# Patient Record
Sex: Female | Born: 1935
Health system: Southern US, Community
[De-identification: ages and names within clinical notes are randomized; demographics above are authoritative.]

## PROBLEM LIST (undated history)

## (undated) DIAGNOSIS — H409 Unspecified glaucoma: Secondary | ICD-10-CM

## (undated) DIAGNOSIS — K635 Polyp of colon: Secondary | ICD-10-CM

## (undated) DIAGNOSIS — E785 Hyperlipidemia, unspecified: Secondary | ICD-10-CM

## (undated) DIAGNOSIS — R32 Unspecified urinary incontinence: Secondary | ICD-10-CM

## (undated) DIAGNOSIS — F32A Depression, unspecified: Secondary | ICD-10-CM

## (undated) DIAGNOSIS — Z8619 Personal history of other infectious and parasitic diseases: Secondary | ICD-10-CM

## (undated) DIAGNOSIS — F329 Major depressive disorder, single episode, unspecified: Secondary | ICD-10-CM

## (undated) DIAGNOSIS — K219 Gastro-esophageal reflux disease without esophagitis: Secondary | ICD-10-CM

## (undated) DIAGNOSIS — G4730 Sleep apnea, unspecified: Secondary | ICD-10-CM

## (undated) DIAGNOSIS — T7840XA Allergy, unspecified, initial encounter: Secondary | ICD-10-CM

## (undated) DIAGNOSIS — I10 Essential (primary) hypertension: Secondary | ICD-10-CM

## (undated) HISTORY — DX: Polyp of colon: K63.5

## (undated) HISTORY — DX: Personal history of other infectious and parasitic diseases: Z86.19

## (undated) HISTORY — DX: Sleep apnea, unspecified: G47.30

## (undated) HISTORY — DX: Major depressive disorder, single episode, unspecified: F32.9

## (undated) HISTORY — DX: Unspecified urinary incontinence: R32

## (undated) HISTORY — DX: Unspecified glaucoma: H40.9

## (undated) HISTORY — PX: EYE SURGERY: SHX253

## (undated) HISTORY — DX: Gastro-esophageal reflux disease without esophagitis: K21.9

## (undated) HISTORY — DX: Depression, unspecified: F32.A

## (undated) HISTORY — PX: JOINT REPLACEMENT: SHX530

## (undated) HISTORY — DX: Hyperlipidemia, unspecified: E78.5

## (undated) HISTORY — PX: BREAST EXCISIONAL BIOPSY: SUR124

## (undated) HISTORY — DX: Allergy, unspecified, initial encounter: T78.40XA

## (undated) HISTORY — DX: Essential (primary) hypertension: I10

---

## 1963-01-17 HISTORY — PX: VARICOSE VEIN SURGERY: SHX832

## 1972-01-17 HISTORY — PX: CHOLECYSTECTOMY: SHX55

## 1982-01-16 HISTORY — PX: ABDOMINAL HYSTERECTOMY: SHX81

## 1988-01-17 HISTORY — PX: CARPAL TUNNEL RELEASE: SHX101

## 2002-01-16 HISTORY — PX: CATARACT EXTRACTION, BILATERAL: SHX1313

## 2004-01-17 HISTORY — PX: REPLACEMENT TOTAL KNEE BILATERAL: SUR1225

## 2005-01-16 HISTORY — PX: REPLACEMENT TOTAL KNEE: SUR1224

## 2017-08-06 LAB — HM MAMMOGRAPHY

## 2017-08-13 LAB — HM DIABETES EYE EXAM

## 2018-01-02 DIAGNOSIS — Z8639 Personal history of other endocrine, nutritional and metabolic disease: Secondary | ICD-10-CM | POA: Insufficient documentation

## 2018-01-02 DIAGNOSIS — M1990 Unspecified osteoarthritis, unspecified site: Secondary | ICD-10-CM | POA: Insufficient documentation

## 2018-01-02 DIAGNOSIS — G4733 Obstructive sleep apnea (adult) (pediatric): Secondary | ICD-10-CM | POA: Insufficient documentation

## 2018-01-02 DIAGNOSIS — I10 Essential (primary) hypertension: Secondary | ICD-10-CM | POA: Insufficient documentation

## 2018-02-06 ENCOUNTER — Ambulatory Visit (INDEPENDENT_AMBULATORY_CARE_PROVIDER_SITE_OTHER): Payer: Medicare Other | Admitting: Nurse Practitioner

## 2018-02-06 ENCOUNTER — Encounter: Payer: Self-pay | Admitting: Nurse Practitioner

## 2018-02-06 ENCOUNTER — Other Ambulatory Visit: Payer: Self-pay

## 2018-02-06 VITALS — BP 151/76 | HR 69 | Temp 97.3°F | Ht 65.0 in | Wt 330.5 lb

## 2018-02-06 DIAGNOSIS — M216X1 Other acquired deformities of right foot: Secondary | ICD-10-CM

## 2018-02-06 DIAGNOSIS — Z6841 Body Mass Index (BMI) 40.0 and over, adult: Secondary | ICD-10-CM

## 2018-02-06 DIAGNOSIS — R29898 Other symptoms and signs involving the musculoskeletal system: Secondary | ICD-10-CM

## 2018-02-06 DIAGNOSIS — R2689 Other abnormalities of gait and mobility: Secondary | ICD-10-CM

## 2018-02-06 DIAGNOSIS — K219 Gastro-esophageal reflux disease without esophagitis: Secondary | ICD-10-CM | POA: Diagnosis not present

## 2018-02-06 DIAGNOSIS — M1990 Unspecified osteoarthritis, unspecified site: Secondary | ICD-10-CM

## 2018-02-06 DIAGNOSIS — F324 Major depressive disorder, single episode, in partial remission: Secondary | ICD-10-CM | POA: Diagnosis not present

## 2018-02-06 DIAGNOSIS — I10 Essential (primary) hypertension: Secondary | ICD-10-CM

## 2018-02-06 DIAGNOSIS — Z7689 Persons encountering health services in other specified circumstances: Secondary | ICD-10-CM

## 2018-02-06 DIAGNOSIS — M216X2 Other acquired deformities of left foot: Secondary | ICD-10-CM

## 2018-02-06 MED ORDER — NAPROXEN SODIUM 550 MG PO TABS: 550.0000 mg | ORAL_TABLET | Freq: Two times a day (BID) | ORAL | 1 refills | Status: DC

## 2018-02-06 MED ORDER — TRIAMTERENE-HCTZ 75-50 MG PO TABS: 1.0000 | ORAL_TABLET | Freq: Every day | ORAL | 1 refills | Status: DC

## 2018-02-06 MED ORDER — DULOXETINE HCL 60 MG PO CPEP: 60.0000 mg | ORAL_CAPSULE | Freq: Every day | ORAL | 1 refills | Status: DC

## 2018-02-06 MED ORDER — PANTOPRAZOLE SODIUM 40 MG PO TBEC: 40.0000 mg | DELAYED_RELEASE_TABLET | Freq: Every day | ORAL | 3 refills | Status: DC

## 2018-02-06 MED ORDER — LOSARTAN POTASSIUM 100 MG PO TABS: 100.0000 mg | ORAL_TABLET | Freq: Every day | ORAL | 3 refills | Status: DC

## 2018-02-06 NOTE — Progress Notes (Signed)
Subjective:    Patient ID: Lindsey Jacobs, female    DOB: Jul 05, 1935, 83 y.o.   MRN: [id number]  Lindsey Jacobs is a 83 y.o. female presenting on 02/06/2018 for Establish Care   HPI Establish Care New Provider Pt last seen by PCP Dr. Yetta BarreJones in [place], [place] about 6 months ago.  Obtain records.  - Dr. Chilton SiGreen [facility]opedic: Ankle pain bilateral - Dr. Modena NunneryApple ophthalmology: [facility] - Dr. Linde GillisFlannagan Dentist  Patient is not accompanied into the exam room by family today, but her daughter brought her to the appointment.  Patient is now living with her daughter as primary caregiver.  Memory impairment Patient is concerned about her own safety with memory impairment.  She realized she would not be able to live alone much longer.  She notes she was leaving pot on on the stove, forgetting to lock her door.  Is not driving as she is physically unable to get to her car by herself.  Osteoarthritis - [facility].  Has not been since last June.  Has joined Y here, but hasn't gotten there yet to exercise.  Doesn't want to ask her daughter to take her during the week.   - Has considered Monday morning as a good time.  Her daughter works M-F 3rd shift.   - Patient has taken regular naproxen 500 mg bid for arthritis pain.  Hypertension - Patient notes she has been stable on triamterene-hydrochlorothiazide 75-50 mg once daily, losartan 100 mg once daily.  Depression, pain: patient continues on duloxetine 60mg  once daily.  Tolerates well without side effects.    GERD: patient takes pantoprazole with good control of heartburn and reflux symptoms.  She notes no bleeding symptoms.  She reports no n/v, coffee ground emesis, dark/black/tarry stool, BRBPR, or other GI bleeding.  Pronation of feet Patient reports she has RIGHT shoe support/orthotic after-market addition start to come off her shoe.  She previously had these shoes purchased with after-market support added to  prevent overpronation of foot/ankle.  She continues to have ankle weakness and pain with desire and need for ongoing orthotics for ambulation.  Social History   Tobacco Use  . Smoking status: Never Smoker  . Smokeless tobacco: Never Used  Substance Use Topics  . Alcohol use: Never    Frequency: Never  . Drug use: Never    Review of Systems  Constitutional: Negative for activity change, appetite change and fatigue.  Respiratory: Negative for cough and shortness of breath.   Cardiovascular: Negative for chest pain, palpitations and leg swelling.  Gastrointestinal: Negative for constipation, diarrhea, nausea and vomiting.  Genitourinary: Negative for dysuria, frequency and urgency.  Musculoskeletal: Positive for arthralgias, back pain and gait problem. Negative for myalgias.  Skin: Negative for rash.  Neurological: Positive for weakness. Negative for dizziness and headaches.  Psychiatric/Behavioral: Positive for confusion (memory impairment/forgetfulness). Negative for dysphoric mood. The patient is not nervous/anxious.    Per HPI unless specifically indicated above     Objective:    BP (!) 151/76 (BP Location: Left Arm, Patient Position: Sitting, Cuff Size: Large)   Pulse 69   Temp (!) 97.3 F (36.3 C) (Oral)   Ht 5\' 5"  (1.651 m)   Wt (!) 330 lb 8 oz (149.9 kg)   BMI 55.00 kg/m   Wt Readings from Last 3 Encounters:  02/06/18 (!) 330 lb 8 oz (149.9 kg)    Physical Exam Vitals signs reviewed.  Constitutional:  General: She is not in acute distress.    Appearance: She is well-developed. She is morbidly obese.  HENT:     Head: Normocephalic and atraumatic.  Eyes:     Pupils: Pupils are equal, round, and reactive to light.  Cardiovascular:     Rate and Rhythm: Normal rate and regular rhythm.     Pulses:          Radial pulses are 2+ on the right side and 2+ on the left side.       Posterior tibial pulses are 1+ on the right side and 1+ on the left side.     Heart  sounds: Normal heart sounds, S1 normal and S2 normal.  Pulmonary:     Effort: Pulmonary effort is normal. No respiratory distress.     Breath sounds: Normal breath sounds and air entry.  Abdominal:     General: Abdomen is protuberant. Bowel sounds are normal. There is no distension.     Palpations: Abdomen is soft.     Tenderness: There is no abdominal tenderness.  Musculoskeletal:     Right lower leg: No edema.     Left lower leg: No edema.  Skin:    General: Skin is warm and dry.     Capillary Refill: Capillary refill takes less than 2 seconds.  Neurological:     Mental Status: She is alert and oriented to person, place, and time.     Coordination: Coordination normal.     Gait: Gait abnormal (antalgic,  uses wheelchair for mobility currently. Foot step is overpronated, supported by orthotic suport on left shoe.).     Deep Tendon Reflexes: Reflexes normal.  Psychiatric:        Attention and Perception: Attention normal.        Mood and Affect: Mood and affect normal.        Behavior: Behavior normal. Behavior is cooperative.        Thought Content: Thought content normal.        Cognition and Memory: Cognition normal. She exhibits impaired recent memory.        Judgment: Judgment normal.    Results for orders placed or performed in visit on 02/06/18  COMPLETE METABOLIC PANEL WITH GFR  Result Value Ref Range   Glucose, Bld 91 65 - 99 mg/dL   BUN 21 7 - 25 mg/dL   Creat 4.091.24 (H) 8.110.60 - 0.88 mg/dL   GFR, Est Non African American 41 (L) > OR = 60 mL/min/1.173m2   GFR, Est African American 48 (L) > OR = 60 mL/min/1.3173m2   BUN/Creatinine Ratio 17 6 - 22 (calc)   Sodium 142 135 - 146 mmol/L   Potassium 5.0 3.5 - 5.3 mmol/L   Chloride 106 98 - 110 mmol/L   CO2 30 20 - 32 mmol/L   Calcium 9.9 8.6 - 10.4 mg/dL   Total Protein 6.8 6.1 - 8.1 g/dL   Albumin 3.7 3.6 - 5.1 g/dL   Globulin 3.1 1.9 - 3.7 g/dL (calc)   AG Ratio 1.2 1.0 - 2.5 (calc)   Total Bilirubin 0.5 0.2 - 1.2 mg/dL     Alkaline phosphatase (APISO) 102 33 - 130 U/L   AST 13 10 - 35 U/L   ALT 8 6 - 29 U/L  CBC with Differential/Platelet  Result Value Ref Range   WBC 7.0 3.8 - 10.8 Thousand/uL   RBC 4.59 3.80 - 5.10 Million/uL   Hemoglobin 12.9 11.7 - 15.5 g/dL   HCT  38.2 35.0 - 45.0 %   MCV 83.2 80.0 - 100.0 fL   MCH 28.1 27.0 - 33.0 pg   MCHC 33.8 32.0 - 36.0 g/dL   RDW 16.1 09.6 - 04.5 %   Platelets 233 140 - 400 Thousand/uL   MPV 11.0 7.5 - 12.5 fL   Neutro Abs 4,459 1,500 - 7,800 cells/uL   Lymphs Abs 1,652 850 - 3,900 cells/uL   Absolute Monocytes 441 200 - 950 cells/uL   Eosinophils Absolute 399 15 - 500 cells/uL   Basophils Absolute 49 0 - 200 cells/uL   Neutrophils Relative % 63.7 %   Total Lymphocyte 23.6 %   Monocytes Relative 6.3 %   Eosinophils Relative 5.7 %   Basophils Relative 0.7 %      Assessment & Plan:   Problem List Items Addressed This Visit      Cardiovascular and Mediastinum   Essential hypertension - Primary Uncontrolled hypertension.  BP goal < 130/80.  Pt is working on lifestyle modifications.  Taking medications tolerating well without side effects.    Plan: 1. Continue taking medications currently without change.  Patient to call clinic if BP remains above goal 140/90.  Would prefer goal < 130/80 if patient can tolerate without side effects. 2. Obtain labs today  3. Encouraged heart healthy diet and increasing exercise to 30 minutes most days of the week. 4. Check BP 1-2 x per week at home, keep log, and bring to clinic at next appointment. 5. Follow up 3 months.     Relevant Medications   losartan (COZAAR) 100 MG tablet   triamterene-hydrochlorothiazide (MAXZIDE) 75-50 MG tablet   Other Relevant Orders   CBC with Differential/Platelet (Completed)     Digestive   Gastroesophageal reflux disease without esophagitis Currently well controlled on pantoprazole 40 mg once daily.  Plan: 1. Continue pantoprazole 40 mg once daily. Side effects discussed. Pt  wants to continue med. 2. Avoid diet triggers. Reviewed need to seek care if globus sensation, difficulty swallowing, s/sx of GI bleed. 3. Follow up as needed and in 3 mos.    Relevant Medications   pantoprazole (PROTONIX) 40 MG tablet   Other Relevant Orders   COMPLETE METABOLIC PANEL WITH GFR (Completed)     Musculoskeletal and Integument   Osteoarthritis, multiple joints Patient with OA multiple joints s/p bilateral TKA, with ankle pain, arthritis, and general ankle weakness.    Plan: 1. Needs CBC, CMP for chronic NSAID use 2. May need to stop naproxen due to renal function in future - high risk for renal insufficiency/damage 3. Encourage regular exercise/movement 4. FOLLOW-UP 3 months prn.  May need orthopedic referral in future.  Patient declines at this time.   Relevant Orders   CBC with Differential/Platelet (Completed)     Other   Morbid obesity with BMI of 50.0-59.9, adult (HCC) Patient with morbid obesity BMI > 50.  She has multiple comorbidities including osteoarthritis, GERD, hypertension. - Encouraged regular physical activity, healthy diet.     Pronation of both feet Patient has pronation complicated by morbid obesity, ankle weakness/pain, and osteoarthritis.    - Patient continues to desire to ambulate with her prosthetically modified shoes.  - Currently patient is independent for ADLs, but cannot leave home without assistance due to poor gait, weakness, and pain with walking.  This is not expected to change, may worsen over time as joints further deteriorate and as patient becomes less mobile.  Plan: 1. Patient needs to have shoe support to keep shoe  and foot stable with walking.  Support is to attach to her shoes medially, bilaterally.  2. Referral placed to [facility] for assistance. 3. Encouraged patient to continue working toward weight loss, regular exercise that removes stress off joints (ie, chair or pool)  4. FOLLOW-UP prn.    Major depressive disorder  with single episode, in partial remission (HCC) Currently stable on duloxetine 60 mg once daily.   Patient reports she is rarely down or depressed.  Follow-up 3 months.   Relevant Medications   DULoxetine (CYMBALTA) 60 MG capsule   Ankle weakness See pronation above    Other Visit Diagnoses    Encounter to establish care     Previous PCP was in [place], [place].  Records will be requested.  Past medical, family, and surgical history reviewed w/ patient in clinic today.     Antalgic gait     See pronation above      Meds ordered this encounter  Medications  . DULoxetine (CYMBALTA) 60 MG capsule    Sig: Take 1 capsule (60 mg total) by mouth daily.    Dispense:  90 capsule    Refill:  1    Order Specific Question:   Supervising Provider    Answer:   Smitty Cords [[id number]]  . losartan (COZAAR) 100 MG tablet    Sig: Take 1 tablet (100 mg total) by mouth daily.    Dispense:  90 tablet    Refill:  1    Order Specific Question:   Supervising Provider    Answer:   Smitty Cords [[id number]]  . pantoprazole (PROTONIX) 40 MG tablet    Sig: Take 1 tablet (40 mg total) by mouth daily.    Dispense:  90 tablet    Refill:  1    Order Specific Question:   Supervising Provider    Answer:   Smitty Cords [[id number]]  . triamterene-hydrochlorothiazide (MAXZIDE) 75-50 MG tablet    Sig: Take 1 tablet by mouth daily.    Dispense:  90 tablet    Refill:  1    Order Specific Question:   Supervising Provider    Answer:   Smitty Cords [[id number]]    Follow up plan: Return in about 3 months (around 05/08/2018) for hypertension.  Wilhelmina Mcardle, DNP, AGPCNP-BC Adult Gerontology Primary Care Nurse Practitioner [facility] 02/06/2018, 11:25 AM

## 2018-02-06 NOTE — Patient Instructions (Addendum)
Lindsey Jacobs,   Thank you for coming in to clinic today.  1. Continue all medications currently without changes.  2. Check Blood Pressure regularly about 2-3 times per week.  If regularly over 140/90, call clinic.  3. Referral to home health placed. They will call you to set up your physical therapy.  4. Referral to orthotics will be coming as well.  They will call you to set up your shoe replacement.  Please schedule a follow-up appointment with Wilhelmina Mcardle, AGNP. Return in about 3 months (around 05/08/2018) for hypertension.  If you have any other questions or concerns, please feel free to call the clinic or send a message through MyChart. You may also schedule an earlier appointment if necessary.  You will receive a survey after today's visit either digitally by e-mail or paper by [employer]. Your experiences and feedback matter to [place].  Please respond so we know how we are doing as we provide care for you.   Wilhelmina Mcardle, DNP, AGNP-BC Adult Gerontology Nurse Practitioner [facility]

## 2018-02-07 ENCOUNTER — Other Ambulatory Visit: Payer: Self-pay | Admitting: Nurse Practitioner

## 2018-02-07 DIAGNOSIS — M1990 Unspecified osteoarthritis, unspecified site: Secondary | ICD-10-CM

## 2018-02-07 LAB — COMPLETE METABOLIC PANEL WITH GFR
AG Ratio: 1.2 (calc) (ref 1.0–2.5)
ALT: 8 U/L (ref 6–29)
AST: 13 U/L (ref 10–35)
Albumin: 3.7 g/dL (ref 3.6–5.1)
Alkaline phosphatase (APISO): 102 U/L (ref 33–130)
BUN/Creatinine Ratio: 17 (calc) (ref 6–22)
BUN: 21 mg/dL (ref 7–25)
CO2: 30 mmol/L (ref 20–32)
Calcium: 9.9 mg/dL (ref 8.6–10.4)
Chloride: 106 mmol/L (ref 98–110)
Creat: 1.24 mg/dL — ABNORMAL HIGH (ref 0.60–0.88)
GFR, Est African American: 48 mL/min/{1.73_m2} — ABNORMAL LOW (ref >=60)
GFR, Est Non African American: 41 mL/min/{1.73_m2} — ABNORMAL LOW (ref >=60)
Globulin: 3.1 g/dL (calc) (ref 1.9–3.7)
Glucose, Bld: 91 mg/dL (ref 65–99)
Potassium: 5 mmol/L (ref 3.5–5.3)
Sodium: 142 mmol/L (ref 135–146)
Total Bilirubin: 0.5 mg/dL (ref 0.2–1.2)
Total Protein: 6.8 g/dL (ref 6.1–8.1)

## 2018-02-07 LAB — CBC WITH DIFFERENTIAL/PLATELET
Absolute Monocytes: 441 cells/uL (ref 200–950)
Basophils Absolute: 49 cells/uL (ref 0–200)
Basophils Relative: 0.7 %
Eosinophils Absolute: 399 cells/uL (ref 15–500)
Eosinophils Relative: 5.7 %
HCT: 38.2 % (ref 35.0–45.0)
Hemoglobin: 12.9 g/dL (ref 11.7–15.5)
Lymphs Abs: 1652 cells/uL (ref 850–3900)
MCH: 28.1 pg (ref 27.0–33.0)
MCHC: 33.8 g/dL (ref 32.0–36.0)
MCV: 83.2 fL (ref 80.0–100.0)
MPV: 11 fL (ref 7.5–12.5)
Monocytes Relative: 6.3 %
Neutro Abs: 4459 cells/uL (ref 1500–7800)
Neutrophils Relative %: 63.7 %
Platelets: 233 10*3/uL (ref 140–400)
RBC: 4.59 10*6/uL (ref 3.80–5.10)
RDW: 14.3 % (ref 11.0–15.0)
Total Lymphocyte: 23.6 %
WBC: 7 10*3/uL (ref 3.8–10.8)

## 2018-02-07 MED ORDER — DICLOFENAC SODIUM 1 % TD GEL: 4.0000 g | Freq: Four times a day (QID) | TRANSDERMAL | 5 refills | Status: DC

## 2018-02-12 ENCOUNTER — Encounter: Payer: Self-pay | Admitting: Nurse Practitioner

## 2018-02-12 DIAGNOSIS — Z6841 Body Mass Index (BMI) 40.0 and over, adult: Secondary | ICD-10-CM

## 2018-02-12 DIAGNOSIS — M216X1 Other acquired deformities of right foot: Secondary | ICD-10-CM | POA: Insufficient documentation

## 2018-02-12 DIAGNOSIS — I10 Essential (primary) hypertension: Secondary | ICD-10-CM | POA: Insufficient documentation

## 2018-02-12 DIAGNOSIS — F325 Major depressive disorder, single episode, in full remission: Secondary | ICD-10-CM | POA: Insufficient documentation

## 2018-02-12 DIAGNOSIS — M159 Polyosteoarthritis, unspecified: Secondary | ICD-10-CM | POA: Insufficient documentation

## 2018-02-12 DIAGNOSIS — R29898 Other symptoms and signs involving the musculoskeletal system: Secondary | ICD-10-CM | POA: Insufficient documentation

## 2018-02-12 DIAGNOSIS — M216X2 Other acquired deformities of left foot: Secondary | ICD-10-CM

## 2018-02-12 DIAGNOSIS — K219 Gastro-esophageal reflux disease without esophagitis: Secondary | ICD-10-CM | POA: Insufficient documentation

## 2018-02-12 MED ORDER — TRIAMTERENE-HCTZ 75-50 MG PO TABS: 1.0000 | ORAL_TABLET | Freq: Every day | ORAL | 1 refills | Status: DC

## 2018-02-12 MED ORDER — PANTOPRAZOLE SODIUM 40 MG PO TBEC: 40.0000 mg | DELAYED_RELEASE_TABLET | Freq: Every day | ORAL | 1 refills | Status: DC

## 2018-02-12 MED ORDER — DULOXETINE HCL 60 MG PO CPEP: 60.0000 mg | ORAL_CAPSULE | Freq: Every day | ORAL | 1 refills | Status: DC

## 2018-02-12 MED ORDER — LOSARTAN POTASSIUM 100 MG PO TABS: 100.0000 mg | ORAL_TABLET | Freq: Every day | ORAL | 1 refills | Status: DC

## 2018-02-18 ENCOUNTER — Telehealth: Payer: Self-pay | Admitting: Nurse Practitioner

## 2018-02-18 NOTE — Telephone Encounter (Signed)
Pt.wanted to know if you could find a Dr for [facility] (specialty) her  Eye Dr gave her a name in [place] that was to far. Pt call back # is  [phone]

## 2018-02-18 NOTE — Telephone Encounter (Signed)
[facility] [address] [place], [place] [address] Phone: [phone] [email or web address]  Patient can call and make this appointment without a referral.

## 2018-02-27 ENCOUNTER — Other Ambulatory Visit: Payer: Self-pay | Admitting: Nurse Practitioner

## 2018-02-27 DIAGNOSIS — M216X1 Other acquired deformities of right foot: Secondary | ICD-10-CM

## 2018-02-27 DIAGNOSIS — M216X2 Other acquired deformities of left foot: Principal | ICD-10-CM

## 2018-02-27 DIAGNOSIS — R29898 Other symptoms and signs involving the musculoskeletal system: Secondary | ICD-10-CM

## 2018-02-27 DIAGNOSIS — M1990 Unspecified osteoarthritis, unspecified site: Secondary | ICD-10-CM

## 2018-03-06 ENCOUNTER — Telehealth: Payer: Self-pay | Admitting: Nurse Practitioner

## 2018-03-06 NOTE — Telephone Encounter (Signed)
Pt needs the office notes for her previous ortho sent to the orthopedic that she was referred to.  Her call back number is [phone]

## 2018-03-07 NOTE — Telephone Encounter (Signed)
Medical release sent to [facility].

## 2018-04-24 ENCOUNTER — Encounter: Payer: Self-pay | Admitting: Nurse Practitioner

## 2018-04-24 ENCOUNTER — Other Ambulatory Visit: Payer: Self-pay

## 2018-04-24 ENCOUNTER — Ambulatory Visit (INDEPENDENT_AMBULATORY_CARE_PROVIDER_SITE_OTHER): Payer: Medicare Other | Admitting: Nurse Practitioner

## 2018-04-24 DIAGNOSIS — N3000 Acute cystitis without hematuria: Secondary | ICD-10-CM | POA: Diagnosis not present

## 2018-04-24 MED ORDER — CEPHALEXIN 500 MG PO CAPS: 500.0000 mg | ORAL_CAPSULE | Freq: Three times a day (TID) | ORAL | 0 refills | Status: DC

## 2018-04-24 NOTE — Addendum Note (Signed)
Addended by: Vernard Gambles on: 04/24/2018 10:41 AM   Modules accepted: Orders

## 2018-04-24 NOTE — Progress Notes (Signed)
Telemedicine Encounter: Disclosed to patient at start of encounter that we will provide appropriate telemedicine services.  Patient consents to be treated via phone prior to discussion. - Patient is at her home and is accessed via telephone. - Services are provided by Wilhelmina McardleLauren Caliana Spires from [facility].  Subjective:    Patient ID: Lindsey Jacobs, female    DOB: 1935-08-06, 83 y.o.   MRN: [id number]  Lindsey Shonenez Zettel is a 83 y.o. female presenting on 04/24/2018 for Urinary Tract Infection (foul odor, w/ cloudy urine, dysuria and urinary frequency x 4 days  )  HPI UTI symptoms Symptoms started about 4 days ago.  Patient had urinary frequency, Drinks lots of water and is not usually dark yellow.   Burning started today and "made me call you."  Dark yellow with odor.  Cloudy today.   Patient denies pelvic pain, or flank pain. Patient has had new back pain.  - Patient has had no fever. But has had chills and sweats.  Social History   Tobacco Use  . Smoking status: Never Smoker  . Smokeless tobacco: Never Used  Substance Use Topics  . Alcohol use: Never    Frequency: Never  . Drug use: Never   Review of Systems Per HPI unless specifically indicated above    Objective:    There were no vitals taken for this visit.  Wt Readings from Last 3 Encounters:  02/06/18 (!) 330 lb 8 oz (149.9 kg)    Physical Exam Patient remotely monitored.  Verbal communication appropriate.  Cognition normal.   Results for orders placed or performed in visit on 02/20/18  HM MAMMOGRAPHY  Result Value Ref Range   HM Mammogram 0-4 Bi-Rad 0-4 Bi-Rad, Self Reported Normal      Assessment & Plan:   Problem List Items Addressed This Visit    None    Visit Diagnoses    Acute cystitis without hematuria    -  Primary   Relevant Medications   cephALEXin (KEFLEX) 500 MG capsule    Acute cystitis without visible hematuria.  Pt symptomatic currently with increased back pain and urinary changes  pressure x 4 days.  Additional dysuria, burning x 1 day. Currently with chills/sweats but without any other systemic signs or symptoms of infection.   - No current risk of concurrent STI.  Plan: 1. START Keflex 500mg  3 times daily for next 7 days.   - Telehealth has prevented UA and culture collection.  IF not improving with empiric treatment, will need urine sample and urine culture. 2. Provided non-pharm measures for UTI prevention for good hygiene. 3. Drink plenty of fluids and improve hydration over next 1 week. 4. Provided precautions for severe symptoms requiring ED visit to include no urine in 24-48 hours. 5. Followup 2-5 days as needed for worsening or persistent symptoms.    Meds ordered this encounter  Medications  . cephALEXin (KEFLEX) 500 MG capsule    Sig: Take 1 capsule (500 mg total) by mouth 3 (three) times daily for 7 days.    Dispense:  21 capsule    Refill:  0    Order Specific Question:   Supervising Provider    Answer:   Smitty CordsKARAMALEGOS, ALEXANDER J [[id number]]   - Time spent in direct consultation with patient via telemedicine about above concerns: 6 minutes  Follow up plan: Return 2-5 days if symptoms worsen or fail to improve.  Wilhelmina McardleLauren Brix Brearley, DNP, AGPCNP-BC Adult Gerontology Primary Care Nurse Practitioner [facility]  [facility] 04/24/2018, 10:29 AM

## 2018-04-26 ENCOUNTER — Encounter: Payer: Self-pay | Admitting: Nurse Practitioner

## 2018-04-26 DIAGNOSIS — H401231 Low-tension glaucoma, bilateral, mild stage: Secondary | ICD-10-CM

## 2018-04-26 DIAGNOSIS — H3530 Unspecified macular degeneration: Secondary | ICD-10-CM | POA: Insufficient documentation

## 2018-04-26 DIAGNOSIS — H401290 Low-tension glaucoma, unspecified eye, stage unspecified: Secondary | ICD-10-CM | POA: Insufficient documentation

## 2018-04-29 ENCOUNTER — Encounter: Payer: Self-pay | Admitting: Nurse Practitioner

## 2018-04-29 ENCOUNTER — Telehealth: Payer: Self-pay

## 2018-04-29 ENCOUNTER — Other Ambulatory Visit: Payer: Self-pay | Admitting: Nurse Practitioner

## 2018-04-29 NOTE — Telephone Encounter (Signed)
Reviewed [facility] Call center message:  Patient is reporting a rash on her neck that is progressing downward toward chest.  Neck has been itching for a few days.  Rash and itching woke her from sleep early today.  Then she noted a welt-like rash.  Denies fever.  Keflex was started on 4/8 and she has nearly completed 5 days of treatment.  This is often our full course.  No new antibiotics will be needed. - Start benadryl once today as needed - STOP Keflex at this time.

## 2018-04-29 NOTE — Telephone Encounter (Signed)
The pt called complaining rash on her neck and chest that developed a few days ago. She states that its a red and blotchy rash. I spoke w/ Lauren she recommend that the patient to discontinue abx and start taking benadryl.

## 2018-05-13 ENCOUNTER — Ambulatory Visit (INDEPENDENT_AMBULATORY_CARE_PROVIDER_SITE_OTHER): Payer: Medicare Other | Admitting: Family Medicine

## 2018-05-13 ENCOUNTER — Encounter: Payer: Self-pay | Admitting: Family Medicine

## 2018-05-13 ENCOUNTER — Other Ambulatory Visit: Payer: Self-pay

## 2018-05-13 ENCOUNTER — Ambulatory Visit: Payer: Self-pay | Admitting: Nurse Practitioner

## 2018-05-13 VITALS — BP 120/59 | HR 83

## 2018-05-13 DIAGNOSIS — I10 Essential (primary) hypertension: Secondary | ICD-10-CM

## 2018-05-13 DIAGNOSIS — M8949 Other hypertrophic osteoarthropathy, multiple sites: Secondary | ICD-10-CM

## 2018-05-13 DIAGNOSIS — F325 Major depressive disorder, single episode, in full remission: Secondary | ICD-10-CM

## 2018-05-13 DIAGNOSIS — M159 Polyosteoarthritis, unspecified: Secondary | ICD-10-CM

## 2018-05-13 DIAGNOSIS — M150 Primary generalized (osteo)arthritis: Secondary | ICD-10-CM

## 2018-05-13 MED ORDER — TRIAMTERENE-HCTZ 37.5-25 MG PO TABS: 1.0000 | ORAL_TABLET | Freq: Every day | ORAL | 3 refills | Status: DC

## 2018-05-13 NOTE — Assessment & Plan Note (Signed)
Underlying multiple joint OA/DJD, currently with ankle lower extremity problem limiting her mobility Comorbid with morbid obesity  Plan - Limited current exercise due to coronavirus pandemic affecting her ability to use pool at gym, goal to resume this once able to - Restart topical diclofenac PRN use for ankles and painful joints - Continue duloxetine - Follow-up PRN in future if not improving

## 2018-05-13 NOTE — Assessment & Plan Note (Addendum)
Well-controlled HTN - Home BP readings reviewed  No known complications     Plan:  1. Continue current BP regimen - Maxzide 37.5-25mg  daily - request new rx sent to [facility] today for 90 day - previous dosage of 75-25mg  tab that she would cut in half was ordered, now this has been changed. Attempted to call [facility] to confirm dose but unable to get through will try again tomorrow 2. Encourage improved lifestyle - low sodium diet, regular exercise 3. Continue monitor BP outside office, bring readings to next visit, if persistently >140/90 or new symptoms notify office sooner 4. Follow-up with PCP again in 3-6 months for HTN

## 2018-05-13 NOTE — Progress Notes (Signed)
Virtual Visit via Telephone The purpose of this virtual visit is to provide medical care while limiting exposure to the novel coronavirus (COVID19) for both patient and office staff.  Consent was obtained for phone visit:  Yes.   Answered questions that patient had about telehealth interaction:  Yes.   I discussed the limitations, risks, security and privacy concerns of performing an evaluation and management service by telephone. I also discussed with the patient that there may be a patient responsible charge related to this service. The patient expressed understanding and agreed to proceed.  Patient Location: Home Provider Location: [facility] (Office)  PCP is Wilhelmina McardleLauren Kennedy, AGPCNP-BC - I am currently covering during her maternity leave.  ---------------------------------------------------------------------- Chief Complaint  Patient presents with  . Hypertension    S: Reviewed CMA documentation. I have called patient and gathered additional HPI as follows:  Osteoarthritis multiple joints Previously more active and [facility] a [facility]. Now has not been able to do this due to coronavirus concerns, she has had increasing difficulty with ambulation and ankle pain and stiffness with arthritis. Trying to improve her mobility but limited right now. She was using diclofenac topical PRN but has not used it lately - she plans to resume this.  Major Depression, single episode, in complete remission Currently no new concerns. Doing well with mood. See PHQ GAD score below Continues on Duloxetine 60mg  daily, tolerating well without side effects  CHRONIC HTN: Reports her home BP readings have recently been 121/75 (HR 80), 119/66 (HR 80), 109/68 (HR 88), 117/72 (HR 86), Saturday 124/78 (HR 87), 116/76 (HR 90), 107/68 (HR 100), 129/68 (HR 79), and today 120/59 (HR 83). She has done well overall. She request a change on her HTN medication needs new order for  lower dose 37.5-25mg  dose instead of cutting the 75mg  tablet in half - request from [facility] Current Meds - Losartan 100mg  daily, Maxzide 37.5-25mg  daily Reports good compliance, took meds today. Tolerating well, w/o complaints. Lifestyle: - Diet: low sodium, balanced - Exercise: now limited mobility - with ankle arthritis and obesity limited - using walker regularly, she is not able to walk well, [facility] and [facility] as much. Denies CP, dyspnea, HA, edema, dizziness / lightheadedness  Patient is currently at home in isolation Denies any high risk travel to areas of current concern for COVID19. Denies any known or suspected exposure to person with or possibly with COVID19.  Denies any fevers, chills, sweats, body ache, cough, shortness of breath, sinus pain or pressure, headache, abdominal pain, diarrhea  Past Medical History:  Diagnosis Date  . Allergy   . Colon polyps   . Depression   . GERD (gastroesophageal reflux disease)   . Glaucoma   . Hyperlipidemia   . Hypertension   . Sleep apnea   . Urinary incontinence    Social History   Tobacco Use  . Smoking status: Never Smoker  . Smokeless tobacco: Never Used  Substance Use Topics  . Alcohol use: Never    Frequency: Never  . Drug use: Never    Current Outpatient Medications:  .  brimonidine (ALPHAGAN) 0.2 % ophthalmic solution, Place 1 drop into both eyes 3 (three) times daily., Disp: , Rfl:  .  Cholecalciferol (VITAMIN D3 MAXIMUM STRENGTH PO), Take by mouth., Disp: , Rfl:  .  DULoxetine (CYMBALTA) 60 MG capsule, Take 1 capsule (60 mg total) by mouth daily., Disp: 90 capsule, Rfl: 1 .  losartan (COZAAR) 100 MG tablet, Take 1 tablet (100 mg total) by mouth daily., Disp: 90 tablet, Rfl: 1 .  MAGNESIUM CHLORIDE PO, Take by mouth., Disp: , Rfl:  .  Multiple Vitamins-Minerals (ADVANCED EYE HEALTH PO), Take by mouth., Disp: , Rfl:  .  pantoprazole (PROTONIX) 40 MG tablet, Take 1 tablet (40 mg total) by mouth  daily., Disp: 90 tablet, Rfl: 1 .  Propylene Glycol-Glycerin (SOOTHE) 0.6-0.6 % SOLN, Apply to eye., Disp: , Rfl:  .  diclofenac sodium (VOLTAREN) 1 % GEL, Apply 4 g topically 4 (four) times daily. (Patient not taking: Reported on 05/13/2018), Disp: 200 g, Rfl: 5 .  triamterene-hydrochlorothiazide (MAXZIDE-25) 37.5-25 MG tablet, Take 1 tablet by mouth daily., Disp: 90 tablet, Rfl: 3  Depression screen [facility] 2/9 05/13/2018  Decreased Interest 0  Down, Depressed, Hopeless 0  PHQ - 2 Score 0  Altered sleeping 0  Tired, decreased energy 0  Change in appetite 0  Feeling bad or failure about yourself  0  Trouble concentrating 0  Moving slowly or fidgety/restless 0  Suicidal thoughts 0  PHQ-9 Score 0  Difficult doing work/chores Not difficult at all    GAD 7 : Generalized Anxiety Score 05/13/2018  Nervous, Anxious, on Edge 1  Control/stop worrying 0  Worry too much - different things 0  Trouble relaxing 0  Restless 0  Easily annoyed or irritable 0  Afraid - awful might happen 0  Total GAD 7 Score 1  Anxiety Difficulty Not difficult at all    -------------------------------------------------------------------------- O: No physical exam performed due to remote telephone encounter.  Lab results reviewed.  No results found for this or any previous visit (from the past 2160 hour(s)).  -------------------------------------------------------------------------- A&P:  Problem List Items Addressed This Visit    Essential hypertension - Primary    Well-controlled HTN - Home BP readings reviewed  No known complications     Plan:  1. Continue current BP regimen - Maxzide 37.5-25mg  daily - request new rx sent to [facility] today for 90 day - previous dosage of 75-25mg  tab that she would cut in half was ordered, now this has been changed. Attempted to call [facility] to confirm dose but unable to get through will try again tomorrow 2. Encourage improved lifestyle - low sodium diet,  regular exercise 3. Continue monitor BP outside office, bring readings to next visit, if persistently >140/90 or new symptoms notify office sooner 4. Follow-up with PCP again in 3-6 months for HTN       Relevant Medications   triamterene-hydrochlorothiazide (MAXZIDE-25) 37.5-25 MG tablet   Major depression, single episode, in complete remission (HCC)    Resolved Currently doing well in remission On SNRI Duloxetine, continue current course      Osteoarthritis of multiple joints    Underlying multiple joint OA/DJD, currently with ankle lower extremity problem limiting her mobility Comorbid with morbid obesity  Plan - Limited current exercise due to coronavirus pandemic affecting her ability to use pool at gym, goal to resume this once able to - Restart topical diclofenac PRN use for ankles and painful joints - Continue duloxetine - Follow-up PRN in future if not improving         Meds ordered this encounter  Medications  . triamterene-hydrochlorothiazide (MAXZIDE-25) 37.5-25 MG tablet    Sig: Take 1 tablet by mouth daily.    Dispense:  90 tablet    Refill:  3    Dose changed from 75-25. She should be taking 1 whole  tablet 37.5-25mg  daily.    Follow-up: - Return in 3-6 months for HTN f/u  Patient verbalizes understanding with the above medical recommendations including the limitation of remote medical advice.  Specific follow-up and call-back criteria were given for patient to follow-up or seek medical care more urgently if needed.  - Time spent in direct consultation with patient on phone: 9 minutes  Saralyn Pilar, DO [facility] 05/13/2018, 10:09 AM

## 2018-05-13 NOTE — Assessment & Plan Note (Signed)
Resolved Currently doing well in remission On SNRI Duloxetine, continue current course

## 2018-05-13 NOTE — Patient Instructions (Signed)
No Mychart. AVS given over phone.

## 2018-05-30 ENCOUNTER — Encounter: Payer: Self-pay | Admitting: Family Medicine

## 2018-05-30 ENCOUNTER — Ambulatory Visit (INDEPENDENT_AMBULATORY_CARE_PROVIDER_SITE_OTHER): Payer: Medicare Other | Admitting: Family Medicine

## 2018-05-30 ENCOUNTER — Other Ambulatory Visit: Payer: Self-pay

## 2018-05-30 DIAGNOSIS — H1033 Unspecified acute conjunctivitis, bilateral: Secondary | ICD-10-CM | POA: Diagnosis not present

## 2018-05-30 MED ORDER — POLYMYXIN B-TRIMETHOPRIM 10000-0.1 UNIT/ML-% OP SOLN: 1.0000 [drp] | OPHTHALMIC | 1 refills | Status: DC

## 2018-05-30 NOTE — Patient Instructions (Signed)
AVS info given to patient over phone. No mychart

## 2018-05-30 NOTE — Progress Notes (Signed)
Virtual Visit via Telephone The purpose of this virtual visit is to provide medical care while limiting exposure to the novel coronavirus (COVID19) for both patient and office staff.  Consent was obtained for phone visit:  Yes.   Answered questions that patient had about telehealth interaction:  Yes.   I discussed the limitations, risks, security and privacy concerns of performing an evaluation and management service by telephone. I also discussed with the patient that there may be a patient responsible charge related to this service. The patient expressed understanding and agreed to proceed.  Patient Location: Home Provider Location: [facility] (Office)  PCP is Wilhelmina Mcardle, AGPCNP-BC - I am currently covering during her maternity leave.   ---------------------------------------------------------------------- Chief Complaint  Patient presents with  . Conjunctivitis    crusty eyes, redness, stickiness in her both eyes every morning onset 3 weeks, Ophthalmologist is not open    S: Reviewed CMA documentation. I have called patient and gathered additional HPI as follows:  Conjunctivitis, Bilateral eyes Reports that symptoms started 3-4 weeks now with eye irritation and redness, gradually worsening, with now crusty discharge in morning eyelids stuck together - History of glaucoma, had been followed by [facility] in [place]. She was waiting on new patient apt with [facility] . Uses eye drops for glaucoma usually. [facility] will call her when they are seeing patients again. Admits blurry vision in both eyes, discharge itching and irritation Denies any eye pain, or loss of vision, fever chills cough body ache short of breath, headache  Past Medical History:  Diagnosis Date  . Allergy   . Colon polyps   . Depression   . GERD (gastroesophageal reflux disease)   . Glaucoma   . Hyperlipidemia   . Hypertension   . Sleep apnea   . Urinary incontinence     Social History   Tobacco Use  . Smoking status: Never Smoker  . Smokeless tobacco: Never Used  Substance Use Topics  . Alcohol use: Never    Frequency: Never  . Drug use: Never    Current Outpatient Medications:  .  brimonidine (ALPHAGAN) 0.2 % ophthalmic solution, Place 1 drop into both eyes 3 (three) times daily., Disp: , Rfl:  .  Cholecalciferol (VITAMIN D3 MAXIMUM STRENGTH PO), Take by mouth., Disp: , Rfl:  .  diclofenac sodium (VOLTAREN) 1 % GEL, Apply 4 g topically 4 (four) times daily., Disp: 200 g, Rfl: 5 .  DULoxetine (CYMBALTA) 60 MG capsule, Take 1 capsule (60 mg total) by mouth daily., Disp: 90 capsule, Rfl: 1 .  losartan (COZAAR) 100 MG tablet, Take 1 tablet (100 mg total) by mouth daily., Disp: 90 tablet, Rfl: 1 .  MAGNESIUM CHLORIDE PO, Take by mouth., Disp: , Rfl:  .  Multiple Vitamins-Minerals (ADVANCED EYE HEALTH PO), Take by mouth., Disp: , Rfl:  .  pantoprazole (PROTONIX) 40 MG tablet, Take 1 tablet (40 mg total) by mouth daily., Disp: 90 tablet, Rfl: 1 .  Propylene Glycol-Glycerin (SOOTHE) 0.6-0.6 % SOLN, Apply to eye., Disp: , Rfl:  .  triamterene-hydrochlorothiazide (MAXZIDE-25) 37.5-25 MG tablet, Take 1 tablet by mouth daily., Disp: 90 tablet, Rfl: 3 .  trimethoprim-polymyxin b (POLYTRIM) ophthalmic solution, Place 1 drop into both eyes every 4 (four) hours., Disp: 10 mL, Rfl: 1  Depression screen [facility] 2/9 05/30/2018 05/13/2018  Decreased Interest 0 0  Down, Depressed, Hopeless 0 0  PHQ - 2 Score 0 0  Altered sleeping 0 0  Tired, decreased energy 0 0  Change in appetite 0 0  Feeling bad or failure about yourself  0 0  Trouble concentrating 0 0  Moving slowly or fidgety/restless 0 0  Suicidal thoughts 0 0  PHQ-9 Score 0 0  Difficult doing work/chores Not difficult at all Not difficult at all    GAD 7 : Generalized Anxiety Score 05/13/2018  Nervous, Anxious, on Edge 1  Control/stop worrying 0  Worry too much - different things 0  Trouble relaxing 0   Restless 0  Easily annoyed or irritable 0  Afraid - awful might happen 0  Total GAD 7 Score 1  Anxiety Difficulty Not difficult at all    -------------------------------------------------------------------------- O: No physical exam performed due to remote telephone encounter.  Lab results reviewed.  No results found for this or any previous visit (from the past 2160 hour(s)).  -------------------------------------------------------------------------- A&P:  Problem List Items Addressed This Visit    None    Visit Diagnoses    Acute conjunctivitis of both eyes, unspecified acute conjunctivitis type    -  Primary   Relevant Medications   trimethoprim-polymyxin b (POLYTRIM) ophthalmic solution     Subacute bilateral conjunctivitis for past 3-4 weeks with reported gradual worsening moderate scleral/conjunctival injection with clear discharge and crusting. Suspected viral vs bacterial etiology Limited due to virtual telephone encounter today - inadequate conservative treatment at home  Plan: 1. Start Polytrim antibiotic eye drops 1 drop in both eye every 4 hours for 7-10 days 2. May use moist warm compresses over eyelid 10-15 min at a time, up to 4-6 times daily until resolution 3. Frequent hand washing, avoid rubbing / scratching eye 4. Strict return precautions for spreading infection 5. Follow-up 1-2 weeks as needed  Return to [facility] when they can schedule her.  Strict return criteria to go to hospital ED or urgent care if acute worsening.   Meds ordered this encounter  Medications  . trimethoprim-polymyxin b (POLYTRIM) ophthalmic solution    Sig: Place 1 drop into both eyes every 4 (four) hours.    Dispense:  10 mL    Refill:  1    Follow-up: - Return as needed if not improved  Patient verbalizes understanding with the above medical recommendations including the limitation of remote medical advice.  Specific follow-up and call-back criteria were given  for patient to follow-up or seek medical care more urgently if needed.  - Time spent in direct consultation with patient on phone: 8 minutes   Saralyn PilarAlexander Iasia Forcier, DO [facility] 05/30/2018, 10:54 AM

## 2018-07-16 ENCOUNTER — Other Ambulatory Visit: Payer: Self-pay | Admitting: Nurse Practitioner

## 2018-07-16 DIAGNOSIS — K219 Gastro-esophageal reflux disease without esophagitis: Secondary | ICD-10-CM

## 2018-08-08 ENCOUNTER — Telehealth: Payer: Self-pay

## 2018-08-08 DIAGNOSIS — I10 Essential (primary) hypertension: Secondary | ICD-10-CM

## 2018-08-08 MED ORDER — LOSARTAN POTASSIUM 50 MG PO TABS: 50.0000 mg | ORAL_TABLET | Freq: Every day | ORAL | 1 refills | Status: DC

## 2018-08-08 NOTE — Telephone Encounter (Signed)
Patient has called reporting that she went to eye doctor about a month ago and they started her eye drops for glaucoma.  He BP is now dropping.  Did report having dizzy spells a couple of time.   Reading 99/54 P:65 100/56 P:65 101/53 P: 64  She would like to know what to do about BP meds

## 2018-08-08 NOTE — Telephone Encounter (Signed)
Patient is now taking dorzolamide hcl and is having lowering of systemic BP with symptoms of dizziness.     Reduce losartan to 50 mg once daily.  Continue triamterene-hydrochlorothiazide without change.    Patient verbalizes understanding.

## 2018-09-18 ENCOUNTER — Other Ambulatory Visit: Payer: Self-pay | Admitting: Nurse Practitioner

## 2018-09-18 DIAGNOSIS — F324 Major depressive disorder, single episode, in partial remission: Secondary | ICD-10-CM

## 2018-10-15 ENCOUNTER — Ambulatory Visit (INDEPENDENT_AMBULATORY_CARE_PROVIDER_SITE_OTHER): Payer: Medicare Other | Admitting: Nurse Practitioner

## 2018-10-15 ENCOUNTER — Other Ambulatory Visit: Payer: Self-pay

## 2018-10-15 ENCOUNTER — Encounter: Payer: Self-pay | Admitting: Nurse Practitioner

## 2018-10-15 VITALS — BP 118/66 | HR 64 | Ht 65.0 in | Wt 321.1 lb

## 2018-10-15 DIAGNOSIS — F325 Major depressive disorder, single episode, in full remission: Secondary | ICD-10-CM

## 2018-10-15 DIAGNOSIS — H401231 Low-tension glaucoma, bilateral, mild stage: Secondary | ICD-10-CM

## 2018-10-15 DIAGNOSIS — I10 Essential (primary) hypertension: Secondary | ICD-10-CM | POA: Diagnosis not present

## 2018-10-15 DIAGNOSIS — Z6841 Body Mass Index (BMI) 40.0 and over, adult: Secondary | ICD-10-CM

## 2018-10-15 DIAGNOSIS — M159 Polyosteoarthritis, unspecified: Secondary | ICD-10-CM

## 2018-10-15 DIAGNOSIS — M8949 Other hypertrophic osteoarthropathy, multiple sites: Secondary | ICD-10-CM

## 2018-10-15 DIAGNOSIS — Z23 Encounter for immunization: Secondary | ICD-10-CM

## 2018-10-15 DIAGNOSIS — M150 Primary generalized (osteo)arthritis: Secondary | ICD-10-CM

## 2018-10-15 NOTE — Progress Notes (Signed)
Subjective:    Patient ID: Lindsey Jacobs, female    DOB: 1936/01/12, 83 y.o.   MRN: [id number]  Lindsey Jacobs is a 82 y.o. female presenting on 10/15/2018 for Hypertension   HPI Hypertension - She is checking BP at home or outside of clinic.  Readings improved after reduced losartan (added ophthalmologic beta blocker and became hypotensive). - Current medications: losartan 50 mg daily, triamterene-hydrochlorothiazide 37.5-25 mg once daily, tolerating well without side effects - She is not currently symptomatic. - Pt denies any regular headache, lightheadedness, dizziness, changes in vision, chest tightness/pressure, palpitations, leg swelling, sudden loss of speech or loss of consciousness. - Patient had one dizzy spell.  Energy dropped very quickly.  Patient was nauseous, but resolved after rest and about 30 minutes after taking meclizine (room was spinning). - She  reports no regular exercise routine.   - Her diet is moderate in salt, moderate in fat, and moderate in carbohydrates.   Morbid Obesity - Generally same.  Patient is not able to be very active due to weight and arthritis.   Osteoarthritis improved some after less movement/activity.  [facility] - Patient continues to eat healthy foods with more vegetables, fruits.  She is living with her daughter in [place] who is helping cook for her.  Glaucoma - Patient maintains regular follow-up with optometrist.  Eye pressures are improved to "8 and 9.  Highest was 14" per patient report  Depression Patient feels great moods, happy and is never sad for very long.  Talks regularly on the phone with family and friends.  Depression screen [facility] 2/9 10/15/2018 05/30/2018 05/13/2018  Decreased Interest 0 0 0  Down, Depressed, Hopeless 0 0 0  PHQ - 2 Score 0 0 0  Altered sleeping 0 0 0  Tired, decreased energy 0 0 0  Change in appetite 0 0 0  Feeling bad or failure about yourself  0 0 0  Trouble concentrating 0 0 0  Moving slowly or  fidgety/restless 0 0 0  Suicidal thoughts 0 0 0  PHQ-9 Score 0 0 0  Difficult doing work/chores Not difficult at all Not difficult at all Not difficult at all    Social History   Tobacco Use  . Smoking status: Never Smoker  . Smokeless tobacco: Never Used  Substance Use Topics  . Alcohol use: Never    Frequency: Never  . Drug use: Never   Review of Systems Per HPI unless specifically indicated above    Objective:    BP 118/66 (BP Location: Left Arm, Patient Position: Sitting, Cuff Size: Large)   Pulse 64   Ht 5\' 5"  (1.651 m)   Wt (!) 321 lb 1.6 oz (145.7 kg)   BMI 53.43 kg/m   Wt Readings from Last 3 Encounters:  10/15/18 (!) 321 lb 1.6 oz (145.7 kg)  02/06/18 (!) 330 lb 8 oz (149.9 kg)    Physical Exam Vitals signs reviewed.  Constitutional:      General: She is awake. She is not in acute distress.    Appearance: She is well-developed. She is obese.  HENT:     Head: Normocephalic and atraumatic.  Neck:     Musculoskeletal: Normal range of motion and neck supple.     Vascular: No carotid bruit.  Cardiovascular:     Rate and Rhythm: Normal rate and regular rhythm.     Pulses:          Radial pulses are 2+ on the right side  and 2+ on the left side.       Posterior tibial pulses are 1+ on the right side and 1+ on the left side.     Heart sounds: Normal heart sounds, S1 normal and S2 normal. No murmur. No friction rub. No gallop.   Pulmonary:     Effort: Pulmonary effort is normal. No respiratory distress.     Breath sounds: Normal breath sounds and air entry.  Musculoskeletal:     Right lower leg: 2+ Pitting Edema present.     Left lower leg: 2+ Pitting Edema present.  Skin:    General: Skin is warm and dry.     Capillary Refill: Capillary refill takes less than 2 seconds.  Neurological:     General: No focal deficit present.     Mental Status: She is alert and oriented to person, place, and time. Mental status is at baseline.     Gait: Gait abnormal (Pt has  wheelchair today).  Psychiatric:        Attention and Perception: Attention normal.        Mood and Affect: Mood and affect normal.        Behavior: Behavior normal. Behavior is cooperative.        Thought Content: Thought content normal.        Judgment: Judgment normal.    Results for orders placed or performed in visit on 04/26/18  HM DIABETES EYE EXAM  Result Value Ref Range   HM Diabetic Eye Exam Retinopathy (A) No Retinopathy      Assessment & Plan:   Problem List Items Addressed This Visit      Cardiovascular and Mediastinum   Essential hypertension  -  Primary Controlled hypertension.  BP goal < 130/80.  Pt is working on lifestyle modifications.  Taking medications tolerating well without side effects. Complications: morbid obesity  Plan: 1. Continue taking meds no changes.  Reduced doses remain adequate. 2. Obtain labs  3. Encouraged heart healthy diet and increasing exercise to 30 minutes most days of the week. 4. Check BP 1-2 x per week at home, keep log, and bring to clinic at next appointment. 5. Follow up 6 months.     Relevant Orders   BASIC METABOLIC PANEL WITH GFR (Completed)     Musculoskeletal and Integument   Osteoarthritis of multiple joints Stable today on exam.  Medications tolerated without side effects.  Continue at current doses.  Refills provided.  Check labs today. Followup 6 months.      Other   Morbid obesity with BMI of 50.0-59.9, adult (HCC) Stable.  No significant change.  Encouraged lifestyle modification.  Follow-up 6 mos    Major depression, single episode, in complete remission (HCC) Stable today on exam.  Medications tolerated without side effects.  Continue at current doses.  Refills provided.  Check labs today. Followup 3 months.     Low tension glaucoma Continue follow-up with optometrist.      Other Visit Diagnoses    Needs flu shot      Relevant Orders   Flu Vaccine QUAD High Dose(Fluad) (Completed)      No orders of the  defined types were placed in this encounter.  Follow up plan: No follow-ups on file.  Wilhelmina Mcardle, DNP, AGPCNP-BC Adult Gerontology Primary Care Nurse Practitioner [facility] 10/15/2018, 8:45 AM

## 2018-10-15 NOTE — Patient Instructions (Addendum)
Lindsey Jacobs,   Thank you for coming in to clinic today.  1. Continue all medications as prescribed currently.   - Refill meclizine if you have any more dizzy spells, you can take this.  Please schedule a follow-up appointment. Return in about 6 months (around 04/14/2019) for hypertension.  If you have any other questions or concerns, please feel free to call the clinic or send a message through MyChart. You may also schedule an earlier appointment if necessary.  You will receive a survey after today's visit either digitally by e-mail or paper by [employer]. Your experiences and feedback matter to [place].  Please respond so we know how we are doing as we provide care for you.  Wilhelmina Mcardle, DNP, AGNP-BC Adult Gerontology Nurse Practitioner [facility], [facility]   Influenza (Flu) Vaccine (Inactivated or Recombinant): What You Need to Know 1. Why get vaccinated? Influenza vaccine can prevent influenza (flu). Flu is a contagious disease that spreads around the [place] every year, usually between October and May. Anyone can get the flu, but it is more dangerous for some people. Infants and young children, people 37 years of age and older, pregnant women, and people with certain health conditions or a weakened immune system are at greatest risk of flu complications. Pneumonia, bronchitis, sinus infections and ear infections are examples of flu-related complications. If you have a medical condition, such as heart disease, cancer or diabetes, flu can make it worse. Flu can cause fever and chills, sore throat, muscle aches, fatigue, cough, headache, and runny or stuffy nose. Some people may have vomiting and diarrhea, though this is more common in children than adults. Each year thousands of people in the [place] die from flu, and many more are hospitalized. Flu vaccine prevents millions of illnesses and flu-related visits to the doctor each year. 2. Influenza vaccine CDC  recommends everyone 23 months of age and older get vaccinated every flu season. Children 6 months through 53 years of age may need 2 doses during a single flu season. Everyone else needs only 1 dose each flu season. It takes about 2 weeks for protection to develop after vaccination. There are many flu viruses, and they are always changing. Each year a new flu vaccine is made to protect against three or four viruses that are likely to cause disease in the upcoming flu season. Even when the vaccine doesn't exactly match these viruses, it may still provide some protection. Influenza vaccine does not cause flu. Influenza vaccine may be given at the same time as other vaccines. 3. Talk with your health care provider Tell your vaccine provider if the person getting the vaccine:  Has had an allergic reaction after a previous dose of influenza vaccine, or has any severe, life-threatening allergies.  Has ever had Guillain-Barr Syndrome (also called GBS). In some cases, your health care provider may decide to postpone influenza vaccination to a future visit. People with minor illnesses, such as a cold, may be vaccinated. People who are moderately or severely ill should usually wait until they recover before getting influenza vaccine. Your health care provider can give you more information. 4. Risks of a vaccine reaction  Soreness, redness, and swelling where shot is given, fever, muscle aches, and headache can happen after influenza vaccine.  There may be a very small increased risk of Guillain-Barr Syndrome (GBS) after inactivated influenza vaccine (the flu shot). Young children who get the flu shot along with pneumococcal vaccine (PCV13), and/or DTaP vaccine  at the same time might be slightly more likely to have a seizure caused by fever. Tell your health care provider if a child who is getting flu vaccine has ever had a seizure. People sometimes faint after medical procedures, including vaccination.  Tell your provider if you feel dizzy or have vision changes or ringing in the ears. As with any medicine, there is a very remote chance of a vaccine causing a severe allergic reaction, other serious injury, or death. 5. What if there is a serious problem? An allergic reaction could occur after the vaccinated person leaves the clinic. If you see signs of a severe allergic reaction (hives, swelling of the face and throat, difficulty breathing, a fast heartbeat, dizziness, or weakness), call 9-1-1 and get the person to the nearest hospital. For other signs that concern you, call your health care provider. Adverse reactions should be reported to the Vaccine Adverse Event Reporting System (VAERS). Your health care provider will usually file this report, or you can do it yourself. Visit the VAERS website at www.vaers.SamedayNews.es or call (470)142-0260.VAERS is only for reporting reactions, and VAERS staff do not give medical advice. 6. The National Vaccine Injury Compensation Program The [employer] Vaccine Injury Compensation Program (VICP) is a federal program that was created to compensate people who may have been injured by certain vaccines. Visit the VICP website at GoldCloset.com.ee or call (509)620-1191 to learn about the program and about filing a claim. There is a time limit to file a claim for compensation. 7. How can I learn more?  Ask your healthcare provider.  Call your local or state health department.  Contact the Centers for Disease Control and Prevention (CDC): ? Call (571) 610-6090 (1-800-CDC-INFO) or ? Visit CDC's https://gibson.com/ Vaccine Information Statement (Interim) Inactivated Influenza Vaccine (08/30/2017) This information is not intended to replace advice given to you by your health care provider. Make sure you discuss any questions you have with your health care provider. Document Released: 10/27/2005 Document Revised: 04/23/2018 Document Reviewed: 09/03/2017 Elsevier  Patient Education  2020 [employer].

## 2018-10-16 ENCOUNTER — Encounter: Payer: Self-pay | Admitting: Nurse Practitioner

## 2018-10-16 LAB — BASIC METABOLIC PANEL WITH GFR
BUN/Creatinine Ratio: 14 (calc) (ref 6–22)
BUN: 18 mg/dL (ref 7–25)
CO2: 28 mmol/L (ref 20–32)
Calcium: 9.5 mg/dL (ref 8.6–10.4)
Chloride: 105 mmol/L (ref 98–110)
Creat: 1.31 mg/dL — ABNORMAL HIGH (ref 0.60–0.88)
GFR, Est African American: 44 mL/min/{1.73_m2} — ABNORMAL LOW (ref >=60)
GFR, Est Non African American: 38 mL/min/{1.73_m2} — ABNORMAL LOW (ref >=60)
Glucose, Bld: 98 mg/dL (ref 65–99)
Potassium: 4.4 mmol/L (ref 3.5–5.3)
Sodium: 140 mmol/L (ref 135–146)

## 2018-11-18 ENCOUNTER — Telehealth: Payer: Self-pay

## 2018-11-18 NOTE — Telephone Encounter (Signed)
Patient advised to go to urgent care address and phone number is given. She understood very well and has no question.

## 2018-11-18 NOTE — Telephone Encounter (Signed)
Patient called reporting urine odor, burning and pelvic pain.  Dr. Raliegh Ip has no appointments left for today.  She is requesting a abx called into [facility].  Please advise

## 2018-12-17 ENCOUNTER — Other Ambulatory Visit: Payer: Self-pay | Admitting: Nurse Practitioner

## 2018-12-17 DIAGNOSIS — F324 Major depressive disorder, single episode, in partial remission: Secondary | ICD-10-CM

## 2019-01-17 ENCOUNTER — Other Ambulatory Visit: Payer: Self-pay | Admitting: Nurse Practitioner

## 2019-01-17 DIAGNOSIS — I10 Essential (primary) hypertension: Secondary | ICD-10-CM

## 2019-02-03 ENCOUNTER — Encounter: Payer: Self-pay | Admitting: Family Medicine

## 2019-02-03 ENCOUNTER — Other Ambulatory Visit: Payer: Self-pay

## 2019-02-03 ENCOUNTER — Ambulatory Visit (INDEPENDENT_AMBULATORY_CARE_PROVIDER_SITE_OTHER): Payer: Medicare Other | Admitting: Family Medicine

## 2019-02-03 DIAGNOSIS — M5442 Lumbago with sciatica, left side: Secondary | ICD-10-CM | POA: Diagnosis not present

## 2019-02-03 NOTE — Progress Notes (Signed)
Virtual Visit via Telephone The purpose of this virtual visit is to provide medical care while limiting exposure to the novel coronavirus (COVID19) for both patient and office staff.  Consent was obtained for phone visit:  Yes.   Answered questions that patient had about telehealth interaction:  Yes.   I discussed the limitations, risks, security and privacy concerns of performing an evaluation and management service by telephone. I also discussed with the patient that there may be a patient responsible charge related to this service. The patient expressed understanding and agreed to proceed.  Patient Location: Home Provider Location: [facility])  ---------------------------------------------------------------------- Chief Complaint  Patient presents with  . Back Pain    onset 4 days left side is worst     S: Reviewed CMA documentation. I have called patient and gathered additional HPI as follows:  LOW BACK PAIN, Left > R / Sciatica - Reports symptoms started about 4-5 days ago without known inciting injury. Known chronic back pain for years, but has not had similar sciatica episode like this.   Today seems to be improved but had had some worsening recently  Describes pain as moderate to severe, intermittent worsening with sitting or laying on it or walking. Admits radiating pain with some sciatica from buttocks into thighs, Left was worse with radiating sharp pain episodic.  - Taking Naprosyn 500mg  daily for 2-3 days with some relief. She has CKD Creatinine 1.2, was advised to limit NSAID. - Tried Tylenol 500mg  - once daily PRN - Tried heating pad at night, ice, muscle - History of lumbar OA/DJD - Admits difficulty sleeping due to pain - Denies any fevers/chills, numbness, tingling, weakness, loss of control bladder/bowel incontinence or retention, unintentional wt loss, night sweats  Denies any high risk travel to areas of current concern for  COVID19. Denies any known or suspected exposure to person with or possibly with COVID19.  Denies any fevers, chills, sweats, body ache, cough, shortness of breath, sinus pain or pressure, headache, abdominal pain, diarrhea  Past Medical History:  Diagnosis Date  . Allergy   . Colon polyps   . Depression   . GERD (gastroesophageal reflux disease)   . Glaucoma   . Hyperlipidemia   . Hypertension   . Sleep apnea   . Urinary incontinence    Social History   Tobacco Use  . Smoking status: Never Smoker  . Smokeless tobacco: Never Used  Substance Use Topics  . Alcohol use: Never  . Drug use: Never    Current Outpatient Medications:  .  brimonidine (ALPHAGAN) 0.2 % ophthalmic solution, Place 1 drop into both eyes 3 (three) times daily., Disp: , Rfl:  .  Cholecalciferol (VITAMIN D3 MAXIMUM STRENGTH PO), Take by mouth., Disp: , Rfl:  .  diclofenac sodium (VOLTAREN) 1 % GEL, Apply 4 g topically 4 (four) times daily., Disp: 200 g, Rfl: 5 .  dorzolamide-timolol (COSOPT) 22.3-6.8 MG/ML ophthalmic solution, , Disp: , Rfl:  .  DULoxetine (CYMBALTA) 60 MG capsule, TAKE 1 CAPSULE DAILY (NEED APPOINTMENT OCTOBER), Disp: 90 capsule, Rfl: 0 .  losartan (COZAAR) 50 MG tablet, TAKE 1 TABLET DAILY, Disp: 90 tablet, Rfl: 1 .  MAGNESIUM CHLORIDE PO, Take by mouth., Disp: , Rfl:  .  Magnesium Oxide 500 MG TABS, Take by mouth., Disp: , Rfl:  .  Multiple Vitamins-Minerals (ADVANCED EYE HEALTH PO), Take by mouth., Disp: , Rfl:  .  naproxen (NAPROSYN) 500 MG tablet, Take by mouth., Disp: , Rfl:  .  pantoprazole (PROTONIX) 40 MG tablet, TAKE 1 TABLET DAILY, Disp: 90 tablet, Rfl: 0 .  Propylene Glycol-Glycerin (SOOTHE) 0.6-0.6 % SOLN, Apply to eye., Disp: , Rfl:  .  triamterene-hydrochlorothiazide (MAXZIDE-25) 37.5-25 MG tablet, Take 1 tablet by mouth daily., Disp: 90 tablet, Rfl: 3 .  trimethoprim-polymyxin b (POLYTRIM) ophthalmic solution, Place 1 drop into both eyes every 4 (four) hours., Disp: 10 mL,  Rfl: 1  Depression screen [facility] 2/9 02/03/2019 10/15/2018 05/30/2018  Decreased Interest 0 0 0  Down, Depressed, Hopeless 0 0 0  PHQ - 2 Score 0 0 0  Altered sleeping 0 0 0  Tired, decreased energy 0 0 0  Change in appetite 0 0 0  Feeling bad or failure about yourself  0 0 0  Trouble concentrating 0 0 0  Moving slowly or fidgety/restless 0 0 0  Suicidal thoughts 0 0 0  PHQ-9 Score 0 0 0  Difficult doing work/chores Not difficult at all Not difficult at all Not difficult at all    GAD 7 : Generalized Anxiety Score 05/13/2018  Nervous, Anxious, on Edge 1  Control/stop worrying 0  Worry too much - different things 0  Trouble relaxing 0  Restless 0  Easily annoyed or irritable 0  Afraid - awful might happen 0  Total GAD 7 Score 1  Anxiety Difficulty Not difficult at all    -------------------------------------------------------------------------- O: No physical exam performed due to remote telephone encounter.  Lab results reviewed.  No results found for this or any previous visit (from the past 2160 hour(s)).  -------------------------------------------------------------------------- A&P:  Problem List Items Addressed This Visit    None    Visit Diagnoses    Acute left-sided low back pain with left-sided sciatica    -  Primary   Relevant Medications   naproxen (NAPROSYN) 500 MG tablet     Acute on chronic L>R LBP with associated L>R sciatica. Suspect likely due to muscle spasm/strain, without known injury or trauma. In setting of known chronic LBP with DJD,  - No red flag symptoms - Inadequate conservative therapy   Plan: 1. INCREASE dose of existing anti-inflammatory - Naprosyn 500mg  BID wc for 3 to 5 DAYS then STOP and use PRN only if need, limit dose oral NSAID due to CKD 2. May use Tylenol PRN for breakthrough - up to 1g (500mg  x 2 per dose) 3 times daily - she was under dosing previously 4. Encouraged use of heating pad 1-2x daily for now then PRN - May use  diclofenac topical PRN 5. Follow-up 4-6 weeks if not improved for re-evaluation, consider X-ray imaging, trial of PT, and possibly referral to Orthopedic  Offered prednisone burst for the sciatica symptoms, but we agreed to defer for now, and if worsening she can contact us back and I can order a Prednisone 6 day taper dose pak   No orders of the defined types were placed in this encounter.   Follow-up: - Return in 4-6 weeks as needed if not improved back pain  Patient verbalizes understanding with the above medical recommendations including the limitation of remote medical advice.  Specific follow-up and call-back criteria were given for patient to follow-up or seek medical care more urgently if needed.   - Time spent in direct consultation with patient on phone: 8 minutes   Nobie Putnam, [facility] 02/03/2019, 11:16 AM

## 2019-03-14 ENCOUNTER — Ambulatory Visit: Payer: Medicare Other | Admitting: Family Medicine

## 2019-03-14 ENCOUNTER — Other Ambulatory Visit: Payer: Self-pay

## 2019-03-14 ENCOUNTER — Ambulatory Visit (INDEPENDENT_AMBULATORY_CARE_PROVIDER_SITE_OTHER): Payer: Medicare Other | Admitting: Family Medicine

## 2019-03-14 ENCOUNTER — Encounter: Payer: Self-pay | Admitting: Family Medicine

## 2019-03-14 DIAGNOSIS — M545 Low back pain, unspecified: Secondary | ICD-10-CM

## 2019-03-14 DIAGNOSIS — K219 Gastro-esophageal reflux disease without esophagitis: Secondary | ICD-10-CM

## 2019-03-14 DIAGNOSIS — R1110 Vomiting, unspecified: Secondary | ICD-10-CM

## 2019-03-14 LAB — POCT URINALYSIS DIPSTICK
Bilirubin, UA: NEGATIVE
Blood, UA: NEGATIVE
Glucose, UA: NEGATIVE
Ketones, UA: NEGATIVE
Leukocytes, UA: NEGATIVE
Nitrite, UA: NEGATIVE
Protein, UA: NEGATIVE
Spec Grav, UA: 1.01 (ref 1.010–1.025)
Urobilinogen, UA: 0.2 E.U./dL
pH, UA: 5 (ref 5.0–8.0)

## 2019-03-14 NOTE — Progress Notes (Signed)
Virtual Visit via Telephone The purpose of this virtual visit is to provide medical care while limiting exposure to the novel coronavirus (COVID19) for both patient and office staff.  Consent was obtained for phone visit:  Yes.   Answered questions that patient had about telehealth interaction:  Yes.   I discussed the limitations, risks, security and privacy concerns of performing an evaluation and management service by telephone. I also discussed with the patient that there may be a patient responsible charge related to this service. The patient expressed understanding and agreed to proceed.  Patient Location: Home Provider Location: [facility])  ---------------------------------------------------------------------- Chief Complaint  Patient presents with  . Back Pain    lower onset 5 days denies all other UTI Sxs     S: Reviewed CMA documentation. I have called patient and gathered additional HPI as follows:  Low Back Pain Last visit similar problem 02/03/19 Low back pain was treated w/ conservative care, NSAID OTC, exercises, and it improved Today she is concerned about UTI wanted to have urine checked, already left UA sample here. Says back pain has improved each day. It is not nearly as severe as it was back in January 2021 last visit. Denies dysuria, hematuria, frequency, fever chills  Chronic GERD / Regurgitation 1988 diagnosed with GERD History of heartburn, used to take nexium for years, then back in 2020 she was switched to Pantoprazole 40mg  daily, due to insurance change. She has had issues with coughing spells and had some regurgitation last night, and concerning about swallowing now. - She would like to see GI for endoscopy  Denies any high risk travel to areas of current concern for COVID19. Denies any known or suspected exposure to person with or possibly with COVID19.  Denies any fevers, chills, sweats, body ache, cough, shortness of breath,  sinus pain or pressure, headache, abdominal pain, diarrhea  Past Medical History:  Diagnosis Date  . Allergy   . Colon polyps   . Depression   . GERD (gastroesophageal reflux disease)   . Glaucoma   . Hyperlipidemia   . Hypertension   . Sleep apnea   . Urinary incontinence    Social History   Tobacco Use  . Smoking status: Never Smoker  . Smokeless tobacco: Never Used  Substance Use Topics  . Alcohol use: Never  . Drug use: Never    Current Outpatient Medications:  .  brimonidine (ALPHAGAN) 0.2 % ophthalmic solution, Place 1 drop into both eyes 3 (three) times daily., Disp: , Rfl:  .  Cholecalciferol (VITAMIN D3 MAXIMUM STRENGTH PO), Take by mouth., Disp: , Rfl:  .  diclofenac sodium (VOLTAREN) 1 % GEL, Apply 4 g topically 4 (four) times daily., Disp: 200 g, Rfl: 5 .  dorzolamide-timolol (COSOPT) 22.3-6.8 MG/ML ophthalmic solution, , Disp: , Rfl:  .  DULoxetine (CYMBALTA) 60 MG capsule, TAKE 1 CAPSULE DAILY (NEED APPOINTMENT OCTOBER), Disp: 90 capsule, Rfl: 0 .  losartan (COZAAR) 50 MG tablet, TAKE 1 TABLET DAILY, Disp: 90 tablet, Rfl: 1 .  Magnesium Oxide 500 MG TABS, Take by mouth. Patient is taking 2 tablet once daily., Disp: , Rfl:  .  Multiple Vitamins-Minerals (ADVANCED EYE HEALTH PO), Take by mouth., Disp: , Rfl:  .  naproxen (NAPROSYN) 500 MG tablet, Take by mouth., Disp: , Rfl:  .  pantoprazole (PROTONIX) 40 MG tablet, TAKE 1 TABLET DAILY, Disp: 90 tablet, Rfl: 0 .  Propylene Glycol-Glycerin (SOOTHE) 0.6-0.6 % SOLN, Apply to eye., Disp: , Rfl:  .  triamterene-hydrochlorothiazide (MAXZIDE-25) 37.5-25 MG tablet, Take 1 tablet by mouth daily., Disp: 90 tablet, Rfl: 3 .  trimethoprim-polymyxin b (POLYTRIM) ophthalmic solution, Place 1 drop into both eyes every 4 (four) hours., Disp: 10 mL, Rfl: 1 .  MAGNESIUM CHLORIDE PO, Take by mouth., Disp: , Rfl:   Depression screen [facility] 2/9 03/14/2019 02/03/2019 10/15/2018  Decreased Interest 0 0 0  Down, Depressed, Hopeless 0 0 0   PHQ - 2 Score 0 0 0  Altered sleeping 0 0 0  Tired, decreased energy 0 0 0  Change in appetite 0 0 0  Feeling bad or failure about yourself  0 0 0  Trouble concentrating 0 0 0  Moving slowly or fidgety/restless 0 0 0  Suicidal thoughts 0 0 0  PHQ-9 Score 0 0 0  Difficult doing work/chores Not difficult at all Not difficult at all Not difficult at all    GAD 7 : Generalized Anxiety Score 05/13/2018  Nervous, Anxious, on Edge 1  Control/stop worrying 0  Worry too much - different things 0  Trouble relaxing 0  Restless 0  Easily annoyed or irritable 0  Afraid - awful might happen 0  Total GAD 7 Score 1  Anxiety Difficulty Not difficult at all    -------------------------------------------------------------------------- O: No physical exam performed due to remote telephone encounter.  Lab results reviewed.  Recent Results (from the past 2160 hour(s))  POCT Urinalysis Dipstick     Status: Normal   Collection Time: 03/14/19  1:52 PM  Result Value Ref Range   Color, UA amber    Clarity, UA clear    Glucose, UA Negative Negative   Bilirubin, UA Negative    Ketones, UA Negative    Spec Grav, UA 1.010 1.010 - 1.025   Blood, UA Negative    pH, UA 5.0 5.0 - 8.0   Protein, UA Negative Negative   Urobilinogen, UA 0.2 0.2 or 1.0 E.U./dL   Nitrite, UA Negative    Leukocytes, UA Negative Negative   Appearance     Odor      -------------------------------------------------------------------------- A&P:  Problem List Items Addressed This Visit    Gastroesophageal reflux disease without esophagitis - Primary   Relevant Orders   Ambulatory referral to Gastroenterology    Other Visit Diagnoses    Acute bilateral low back pain without sciatica       Relevant Orders   POCT Urinalysis Dipstick (Completed)   Urine Culture   Regurgitation of food       Relevant Orders   Ambulatory referral to Gastroenterology     #Back Pain Likely MSK pain again similar to prior episode  01/2019, resolve w/ conservative care and MSK management on NSAID - Reassurance today - UA dipstick is negative - Will send for Urine culture as well to be sure - Now seems improved, can continue current therapy. Offered future trial of muscle relaxant or stronger rx nsaid or steroid will defer for now.  #GERD / Regurgitation Chroinc problem GERD still seems controlled on PPI protonix 40mg  daily However with regurgitation and worse night-time symptoms / post prandial symptoms, even water coming back up, concerns may warrant endoscopy, will refer to GI for further consultation at patient's request  Orders Placed This Encounter  Procedures  . Urine Culture  . Ambulatory referral to Gastroenterology    Referral Priority:   Routine    Referral Type:   Consultation    Referral Reason:   Specialty Services Required    Number of Visits  Requested:   1  . POCT Urinalysis Dipstick     No orders of the defined types were placed in this encounter.   Follow-up: - Return as needed  Patient verbalizes understanding with the above medical recommendations including the limitation of remote medical advice.  Specific follow-up and call-back criteria were given for patient to follow-up or seek medical care more urgently if needed.   - Time spent in direct consultation with patient on phone: 11 minutes   Saralyn Pilar, DO [facility] 03/14/2019, 4:25 PM

## 2019-03-16 LAB — URINE CULTURE
MICRO NUMBER:: 10193664
SPECIMEN QUALITY:: ADEQUATE

## 2019-03-24 ENCOUNTER — Encounter: Payer: Self-pay | Admitting: Gastroenterology

## 2019-03-25 ENCOUNTER — Other Ambulatory Visit: Payer: Self-pay

## 2019-03-25 ENCOUNTER — Encounter: Payer: Self-pay | Admitting: Gastroenterology

## 2019-03-25 ENCOUNTER — Ambulatory Visit (INDEPENDENT_AMBULATORY_CARE_PROVIDER_SITE_OTHER): Payer: Medicare Other | Admitting: Gastroenterology

## 2019-03-25 DIAGNOSIS — R1310 Dysphagia, unspecified: Secondary | ICD-10-CM | POA: Diagnosis not present

## 2019-03-25 NOTE — Progress Notes (Signed)
Lindsey Jacobs [address]  [place], [place] [address]  Main: [phone]  Fax: [phone]   Gastroenterology Consultation  Referring Provider:     Nobie Putnam * Primary Care Physician:  Mikey College, NP (Inactive) Reason for Consultation:     Dysphagia        HPI:   Virtual Visit via Video Note  I connected with patient on 03/25/19 at 11:15 AM EST by video ([email or web address]) and verified that I am speaking with the correct person using two identifiers.   I discussed the limitations, risks, security and privacy concerns of performing an evaluation and management service by video and the availability of in person appointments. I also discussed with the patient that there may be a patient responsible charge related to this service. The patient expressed understanding and agreed to proceed.  Location of the patient: Home Location of provider: Home Participating persons: Patient and provider only (Nursing staff checked in patient via phone but were not physically involved in the video interaction - see their notes)   History of Present Illness: Chief Complaint  Patient presents with  . Gastroesophageal Reflux    Patient states she will cough while swallowing food and then she will throw it up. States this comes and goes     Lindsey Jacobs is a 84 y.o. y/o female referred for consultation & management  by Dr. Merrilyn Puma, Jerrel Ivory, NP (Inactive).  Patient reports dysphagia to water only.  States after drinking water, she will have episodes where she "spews out the water".  This happens 3-4 times a week.  No weight loss.  No solid food dysphagia.  No dysphagia to any other liquids or pills.  States it feels like the water sits in her throat and then she brings it back up.  Reports having an upper endoscopy 20 years ago and does not know what was found or why it was done.  Reports having colonoscopies in the past and states last 1 was when she was 28  and she was told that due to her age she does not need another one.  No family history of colon cancer.  No prior procedure report available to [place].  Is chronically on PPI which controls her heartburn  Past Medical History:  Diagnosis Date  . Allergy   . Colon polyps   . Depression   . GERD (gastroesophageal reflux disease)   . Glaucoma   . Hyperlipidemia   . Hypertension   . Sleep apnea   . Urinary incontinence     Past Surgical History:  Procedure Laterality Date  . ABDOMINAL HYSTERECTOMY  1984  . CARPAL TUNNEL RELEASE Right 1990  . CATARACT EXTRACTION, BILATERAL Bilateral 2004  . CESAREAN SECTION  1974  . CHOLECYSTECTOMY  1974  . REPLACEMENT TOTAL KNEE Left 2007  . REPLACEMENT TOTAL KNEE BILATERAL Right 2006  . [place]    Prior to Admission medications   Medication Sig Start Date End Date Taking? Authorizing Provider  brimonidine (ALPHAGAN) 0.2 % ophthalmic solution Place 1 drop into both eyes 3 (three) times daily.   Yes [provider]  Cholecalciferol (VITAMIN D3 MAXIMUM STRENGTH PO) Take by mouth.   Yes [provider]  diclofenac sodium (VOLTAREN) 1 % GEL Apply 4 g topically 4 (four) times daily. 02/07/18  Yes Mikey College, NP  dorzolamide-timolol (COSOPT) 22.3-6.8 MG/ML ophthalmic solution  06/17/18  Yes [provider]  DULoxetine (CYMBALTA) 60 MG capsule  TAKE 1 CAPSULE DAILY (NEED APPOINTMENT OCTOBER) 12/18/18  Yes Karamalegos, Netta Neat, DO  losartan (COZAAR) 50 MG tablet TAKE 1 TABLET DAILY 01/20/19  Yes Karamalegos, Netta Neat, DO  Magnesium Oxide 500 MG TABS Take by mouth. Patient is taking 2 tablet once daily.   Yes [provider]  Multiple Vitamins-Minerals (ADVANCED EYE HEALTH PO) Take by mouth.   Yes [provider]  naproxen (NAPROSYN) 500 MG tablet Take by mouth. 01/04/16  Yes [provider]  pantoprazole (PROTONIX) 40 MG tablet TAKE 1 TABLET DAILY 07/17/18  Yes Karamalegos,  Netta Neat, DO  Propylene Glycol-Glycerin (SOOTHE) 0.6-0.6 % SOLN Apply to eye.   Yes [provider]  triamterene-hydrochlorothiazide (MAXZIDE-25) 37.5-25 MG tablet Take 1 tablet by mouth daily. 05/13/18  Yes Karamalegos, Netta Neat, DO  trimethoprim-polymyxin b (POLYTRIM) ophthalmic solution Place 1 drop into both eyes every 4 (four) hours. 05/30/18  Yes Karamalegos, Netta Neat, DO    Family History  Problem Relation Age of Onset  . Heart disease Father   . Colon polyps Father   . Alcohol abuse Father      Social History   Tobacco Use  . Smoking status: Never Smoker  . Smokeless tobacco: Never Used  Substance Use Topics  . Alcohol use: Never  . Drug use: Never    Allergies as of 03/25/2019 - Review Complete 03/25/2019  Allergen Reaction Noted  . Amoxapine and related  05/13/2018  . Diamox [acetazolamide]  10/15/2018  . Keflex [cephalexin] Rash 04/29/2018  . Tape Rash 02/06/2018    Review of Systems:    All systems reviewed and negative except where noted in HPI.   Observations/Objective:  Labs: CBC    Component Value Date/Time   WBC 7.0 02/06/2018 1202   RBC 4.59 02/06/2018 1202   HGB 12.9 02/06/2018 1202   HCT 38.2 02/06/2018 1202   PLT 233 02/06/2018 1202   MCV 83.2 02/06/2018 1202   MCH 28.1 02/06/2018 1202   MCHC 33.8 02/06/2018 1202   RDW 14.3 02/06/2018 1202   LYMPHSABS 1,652 02/06/2018 1202   EOSABS 399 02/06/2018 1202   BASOSABS 49 02/06/2018 1202   CMP     Component Value Date/Time   NA 140 10/15/2018 0917   K 4.4 10/15/2018 0917   CL 105 10/15/2018 0917   CO2 28 10/15/2018 0917   GLUCOSE 98 10/15/2018 0917   BUN 18 10/15/2018 0917   CREATININE 1.31 (H) 10/15/2018 0917   CALCIUM 9.5 10/15/2018 0917   PROT 6.8 02/06/2018 1202   AST 13 02/06/2018 1202   ALT 8 02/06/2018 1202   BILITOT 0.5 02/06/2018 1202   GFRNONAA 38 (L) 10/15/2018 0917   GFRAA 44 (L) 10/15/2018 0917    Imaging Studies: No results found.  Assessment and  Plan:   Lindsey Jacobs is a 84 y.o. y/o female has been referred for dysphagia to water only  Assessment and Plan: Symptoms are atypical for any obstructive lesions We will obtain esophagram first to evaluate her esophagus and then determine if an upper endoscopy is needed in this elderly female  Heartburn is well controlled with PPI otherwise  No dysphagia to solid foods, no weight loss, no alarm symptoms present indicate urgent endoscopy at this time  Follow Up Instructions:   I discussed the assessment and treatment plan with the patient. The patient was provided an opportunity to ask questions and all were answered. The patient agreed with the plan and demonstrated an understanding of the instructions.   The  patient was advised to call back or seek an in-person evaluation if the symptoms worsen or if the condition fails to improve as anticipated.  I provided15 minutes of face-to-face time via video software during this encounter.  Additional time was spent in reviewing patient's chart, placing orders etc.   Pasty Spillers, MD  Speech recognition software was used to dictate the above note.

## 2019-03-31 ENCOUNTER — Ambulatory Visit
Admission: RE | Admit: 2019-03-31 | Discharge: 2019-03-31 | Disposition: A | Discharge status: Home / Self Care | Payer: Medicare Other | Source: Ambulatory Visit | Attending: Gastroenterology | Admitting: Gastroenterology

## 2019-03-31 ENCOUNTER — Other Ambulatory Visit: Payer: Self-pay

## 2019-03-31 ENCOUNTER — Ambulatory Visit: Payer: Medicare Other

## 2019-03-31 DIAGNOSIS — R1310 Dysphagia, unspecified: Secondary | ICD-10-CM | POA: Diagnosis present

## 2019-03-31 IMAGING — RF DG ESOPHAGUS
8 series · 13 of 13 positions shown · non-contrast
Comparison: None.

CLINICAL DATA: Dysphagia

EXAM:
ESOPHOGRAM / BARIUM SWALLOW / BARIUM TABLET STUDY
TECHNIQUE: Combined double contrast and single contrast examination performed
using effervescent crystals, thick barium liquid, and thin barium
liquid. The patient was observed with fluoroscopy swallowing a 13 mm
barium sulphate tablet.
FLUOROSCOPY TIME:  Fluoroscopy Time:  0.6 minute
Radiation Exposure Index (if provided by the fluoroscopic device):
11.3 mGy
Number of Acquired Spot Images: 0

[Series 1: cp_standard · 0.25mm/px · 3 of 49 frames shown (1 of 8)]
[frame 8/49]
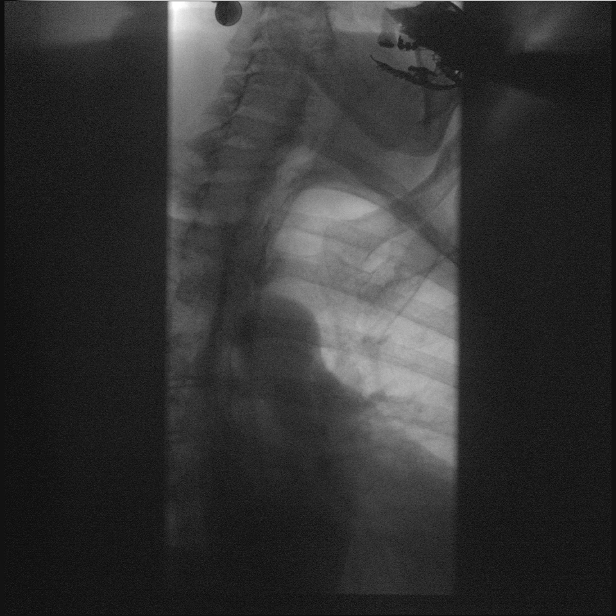
[frame 25/49]
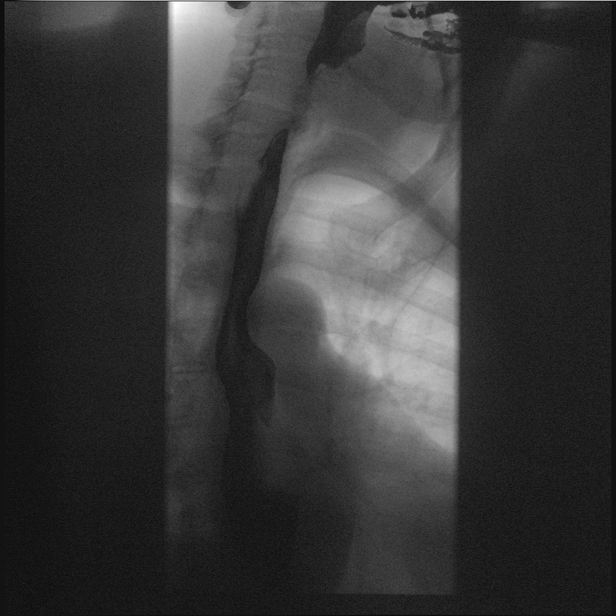
[frame 42/49]
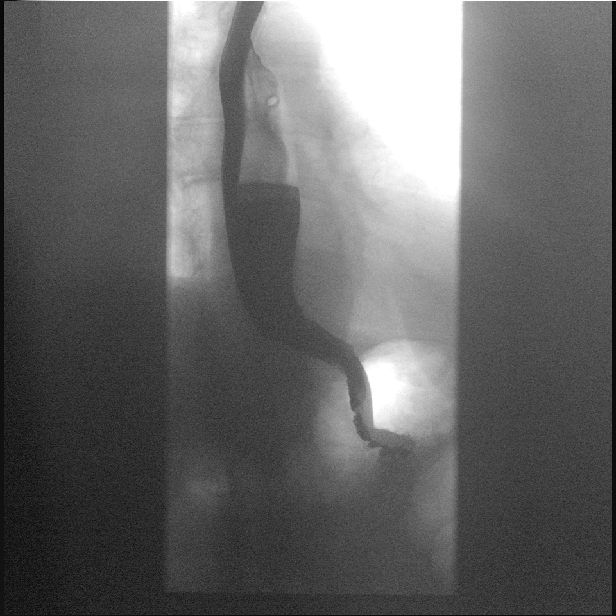

[Series 2: cp_standard · 0.25mm/px · 1 of 1 slices shown (2 of 8)]
[im 1/1]
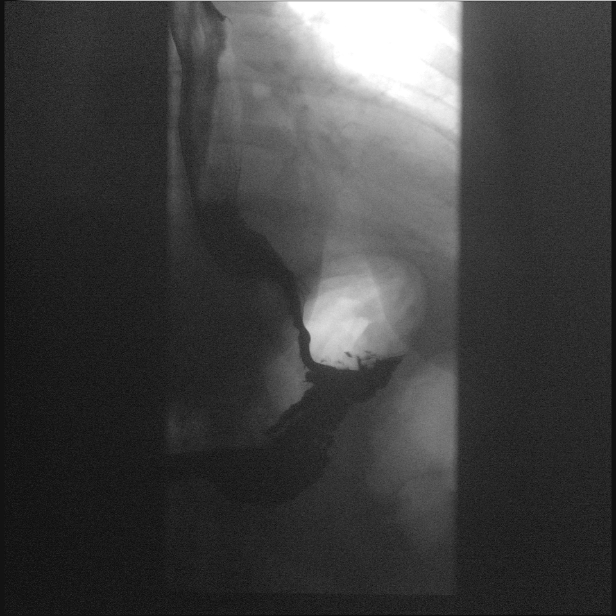

[Series 3: cp_standard · 0.25mm/px · 1 of 1 slices shown (3 of 8)]
[im 1/1]
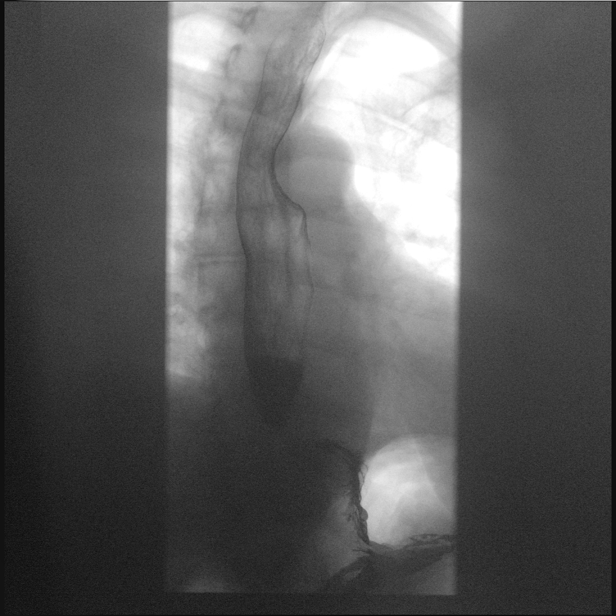

[Series 4: cp_standard · 0.25mm/px · 1 of 1 slices shown (4 of 8)]
[im 1/1]
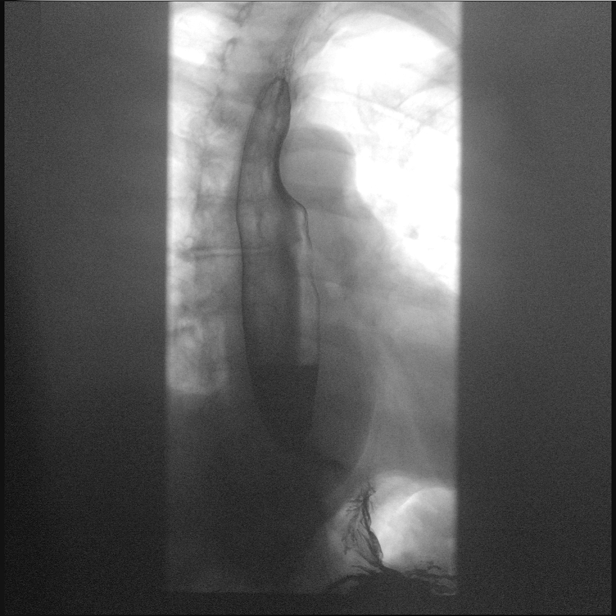

[Series 5: cp_standard · 0.25mm/px · 4 of 7 frames shown (5 of 8)]
[frame 2/7]
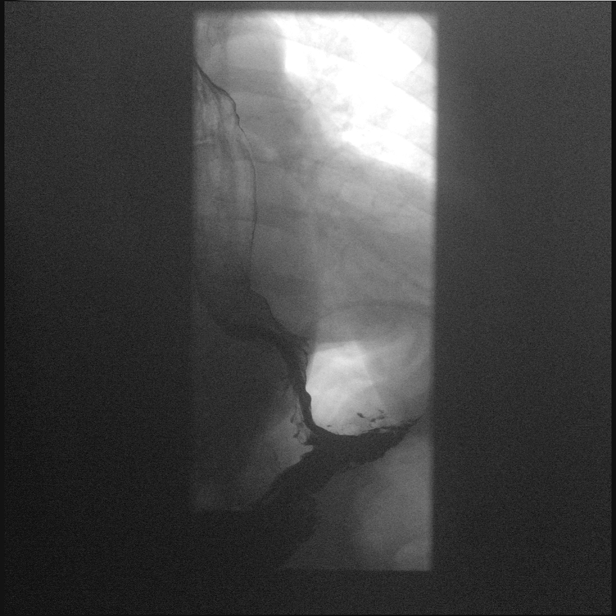
[frame 3/7]
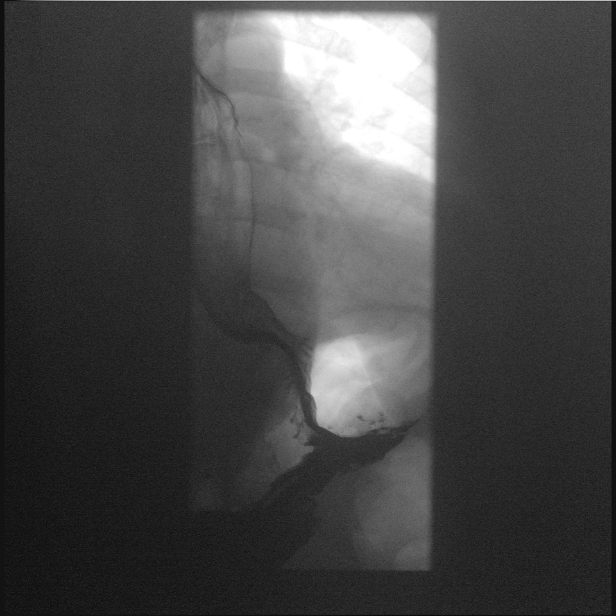
[frame 4/7]
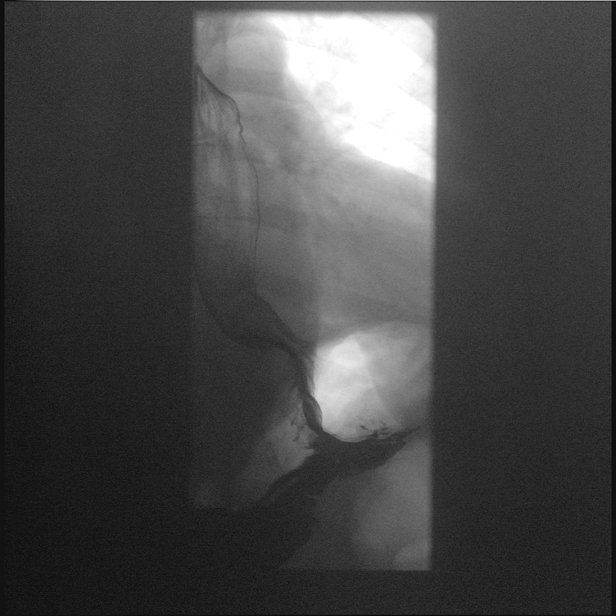
[frame 6/7]
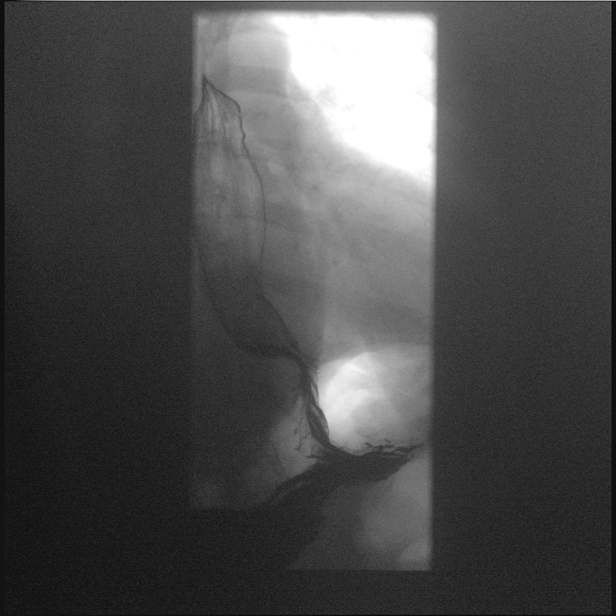

[Series 6: cp_standard · 0.25mm/px · 1 of 1 slices shown (6 of 8)]
[im 1/1]
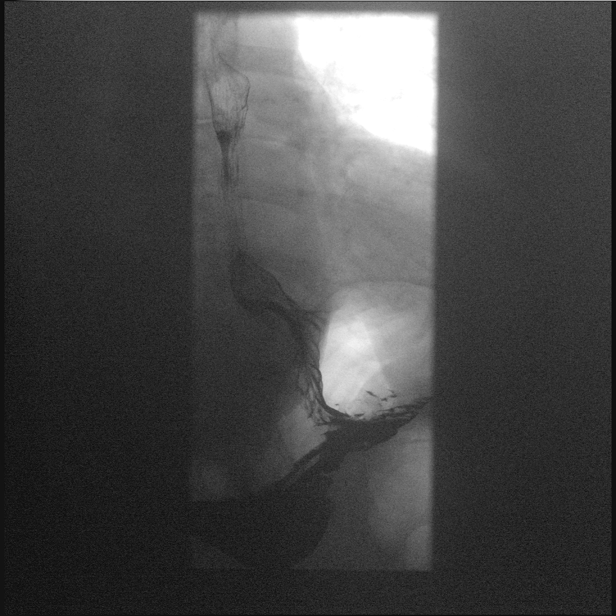

[Series 7: cp_standard · 0.25mm/px · 1 of 1 slices shown (7 of 8)]
[im 1/1]
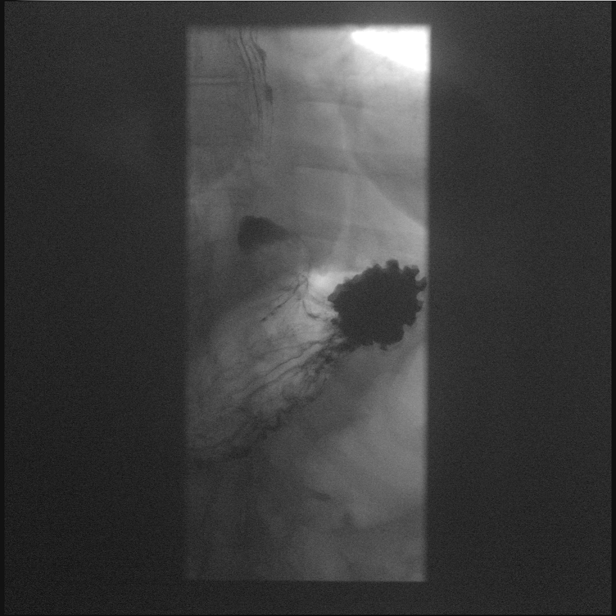

[Series 8: cp_standard · 0.25mm/px · 1 of 1 slices shown (8 of 8)]
[im 1/1]
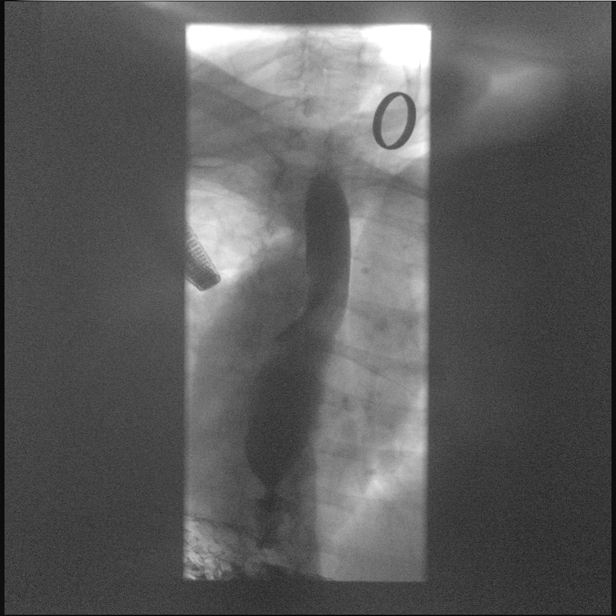

[13 of 13 positions shown; findings below may reference images not displayed]

FINDINGS: Normal pharyngeal anatomy and motility. Contrast flowed freely
through the esophagus without evidence of a stricture or mass.
Normal esophageal mucosa without evidence of irregularity or
ulceration. Esophageal motility was normal. Mild gastroesophageal
reflux. Small transient hiatal hernia.

At the end of the examination a 13 mm barium tablet was administered
which transited through the esophagus and esophagogastric junction
without delay.
IMPRESSION: 1. Mild gastroesophageal reflux.
2. Small transient hiatal hernia.

## 2019-04-03 ENCOUNTER — Telehealth: Payer: Self-pay

## 2019-04-03 ENCOUNTER — Other Ambulatory Visit: Payer: Self-pay

## 2019-04-03 DIAGNOSIS — R1310 Dysphagia, unspecified: Secondary | ICD-10-CM

## 2019-04-03 NOTE — Telephone Encounter (Signed)
-----   Message from Pasty Spillers, MD sent at 04/03/2019 10:26 AM EDT ----- Morrie Sheldon please let the patient know, her study showed mild reflux and a small hiatal hernia. It does not show any lesions to explain her symptoms. I recommend an EGD. If pt is agreeable, please schedule with me for dysphagia

## 2019-04-03 NOTE — Telephone Encounter (Signed)
Patient verbalized understanding. Scheduled patient for EGD on 05/20/2019

## 2019-04-07 ENCOUNTER — Ambulatory Visit: Payer: Medicare Other

## 2019-04-08 ENCOUNTER — Ambulatory Visit: Payer: Medicare Other

## 2019-04-23 ENCOUNTER — Ambulatory Visit (INDEPENDENT_AMBULATORY_CARE_PROVIDER_SITE_OTHER): Payer: Medicare Other | Admitting: Gastroenterology

## 2019-04-23 DIAGNOSIS — R1310 Dysphagia, unspecified: Secondary | ICD-10-CM

## 2019-04-23 NOTE — Progress Notes (Signed)
Vonda Antigua, MD [address]  [place]  [place], [place] [address]  Main: [phone]  Fax: [phone]   Primary Care Physician: Mikey College, NP (Inactive)  Virtual Visit via Telephone Note  I connected with patient on 04/23/19 at 10:30 AM EDT by telephone and verified that I am speaking with the correct person using two identifiers.   I discussed the limitations, risks, security and privacy concerns of performing an evaluation and management service by telephone and the availability of in person appointments. I also discussed with the patient that there may be a patient responsible charge related to this service. The patient expressed understanding and agreed to proceed.  Location of Patient: Home Location of Provider: Home Persons involved: Patient and provider only during the visit (nursing staff and front desk staff was involved in communicating with the patient prior to the appointment, reviewing medications and checking them in)   History of Present Illness: Chief complaint: Dysphagia  HPI: Ciarrah Rae is a 84 y.o. female previously seen for dysphagia, described to be to water only.  She describes it as episodes of "spewing out water".  This occurs once every 2 to 3 weeks.  Does not occur every time she drinks liquids or water.  Does also describe a globus sensation.  No solid food dysphagia.  Esophagram showed mild reflux and small sliding hiatal hernia  Current Outpatient Medications  Medication Sig Dispense Refill  . brimonidine (ALPHAGAN) 0.2 % ophthalmic solution Place 1 drop into both eyes 3 (three) times daily.    . Cholecalciferol (VITAMIN D3 MAXIMUM STRENGTH PO) Take by mouth.    . diclofenac sodium (VOLTAREN) 1 % GEL Apply 4 g topically 4 (four) times daily. 200 g 5  . dorzolamide-timolol (COSOPT) 22.3-6.8 MG/ML ophthalmic solution     . DULoxetine (CYMBALTA) 60 MG capsule TAKE 1 CAPSULE DAILY (NEED APPOINTMENT OCTOBER) 90 capsule 0  .  losartan (COZAAR) 50 MG tablet TAKE 1 TABLET DAILY 90 tablet 1  . Magnesium Oxide 500 MG TABS Take by mouth. Patient is taking 2 tablet once daily.    . Multiple Vitamins-Minerals (ADVANCED EYE HEALTH PO) Take by mouth.    . naproxen (NAPROSYN) 500 MG tablet Take by mouth.    . pantoprazole (PROTONIX) 40 MG tablet TAKE 1 TABLET DAILY 90 tablet 0  . Propylene Glycol-Glycerin (SOOTHE) 0.6-0.6 % SOLN Apply to eye.    . triamterene-hydrochlorothiazide (MAXZIDE-25) 37.5-25 MG tablet Take 1 tablet by mouth daily. 90 tablet 3  . trimethoprim-polymyxin b (POLYTRIM) ophthalmic solution Place 1 drop into both eyes every 4 (four) hours. 10 mL 1   No current facility-administered medications for this visit.    Allergies as of 04/23/2019 - Review Complete 03/25/2019  Allergen Reaction Noted  . Amoxapine and related  05/13/2018  . Diamox [acetazolamide]  10/15/2018  . Keflex [cephalexin] Rash 04/29/2018  . Tape Rash 02/06/2018    Review of Systems:    All systems reviewed and negative except where noted in HPI.   Observations/Objective:  Labs: CMP     Component Value Date/Time   NA 140 10/15/2018 0917   K 4.4 10/15/2018 0917   CL 105 10/15/2018 0917   CO2 28 10/15/2018 0917   GLUCOSE 98 10/15/2018 0917   BUN 18 10/15/2018 0917   CREATININE 1.31 (H) 10/15/2018 0917   CALCIUM 9.5 10/15/2018 0917   PROT 6.8 02/06/2018 1202   AST 13 02/06/2018 1202   ALT 8 02/06/2018 1202   BILITOT 0.5  02/06/2018 1202   GFRNONAA 38 (L) 10/15/2018 0917   GFRAA 44 (L) 10/15/2018 0917   Lab Results  Component Value Date   WBC 7.0 02/06/2018   HGB 12.9 02/06/2018   HCT 38.2 02/06/2018   MCV 83.2 02/06/2018   PLT 233 02/06/2018    Imaging Studies: DG ESOPHAGUS W SINGLE CM (SOL OR THIN BA)  Result Date: 03/31/2019 CLINICAL DATA:  Dysphagia EXAM: ESOPHOGRAM / BARIUM SWALLOW / BARIUM TABLET STUDY TECHNIQUE: Combined double contrast and single contrast examination performed using effervescent crystals,  thick barium liquid, and thin barium liquid. The patient was observed with fluoroscopy swallowing a 13 mm barium sulphate tablet. FLUOROSCOPY TIME:  Fluoroscopy Time:  0.6 minute Radiation Exposure Index (if provided by the fluoroscopic device): 11.3 mGy Number of Acquired Spot Images: 0 COMPARISON:  None. FINDINGS: Normal pharyngeal anatomy and motility. Contrast flowed freely through the esophagus without evidence of a stricture or mass. Normal esophageal mucosa without evidence of irregularity or ulceration. Esophageal motility was normal. Mild gastroesophageal reflux. Small transient hiatal hernia. At the end of the examination a 13 mm barium tablet was administered which transited through the esophagus and esophagogastric junction without delay. IMPRESSION: 1. Mild gastroesophageal reflux. 2. Small transient hiatal hernia. Electronically Signed   By: Elige Ko   On: 03/31/2019 10:12    Assessment and Plan:   Esteen Delpriore is a 84 y.o. y/o female with dysphagia to water only intermittently and heartburn  Assessment and Plan: Heartburn is well controlled with once daily PPI  However, the atypical symptom of dysphagia to water only which is described as "spewing up the water" by the patient, would benefit from further evaluation with EGD to rule out any obstructive lesions in the upper esophagus  I have discussed alternative options, risks & benefits,  which include, but are not limited to, bleeding, infection, perforation,respiratory complication & drug reaction.  The patient agrees with this plan & written consent will be obtained.     Follow Up Instructions:    I discussed the assessment and treatment plan with the patient. The patient was provided an opportunity to ask questions and all were answered. The patient agreed with the plan and demonstrated an understanding of the instructions.   The patient was advised to call back or seek an in-person evaluation if the symptoms worsen or if  the condition fails to improve as anticipated.  I provided 12 minutes of non-face-to-face time during this encounter. Additional time was spent in reviewing patient's chart, placing orders etc.   Pasty Spillers, MD  Speech recognition software was used to dictate this note.

## 2019-05-08 ENCOUNTER — Other Ambulatory Visit: Payer: Self-pay | Admitting: Family Medicine

## 2019-05-08 DIAGNOSIS — I10 Essential (primary) hypertension: Secondary | ICD-10-CM

## 2019-05-14 ENCOUNTER — Other Ambulatory Visit: Payer: Self-pay

## 2019-05-14 ENCOUNTER — Encounter: Payer: Self-pay | Admitting: Family Medicine

## 2019-05-14 ENCOUNTER — Ambulatory Visit (INDEPENDENT_AMBULATORY_CARE_PROVIDER_SITE_OTHER): Payer: Medicare Other | Admitting: Family Medicine

## 2019-05-14 VITALS — BP 139/68 | HR 77 | Temp 97.1°F | Ht 65.0 in | Wt 317.0 lb

## 2019-05-14 DIAGNOSIS — I10 Essential (primary) hypertension: Secondary | ICD-10-CM

## 2019-05-14 DIAGNOSIS — R7309 Other abnormal glucose: Secondary | ICD-10-CM | POA: Diagnosis not present

## 2019-05-14 DIAGNOSIS — H539 Unspecified visual disturbance: Secondary | ICD-10-CM | POA: Diagnosis not present

## 2019-05-14 DIAGNOSIS — R2242 Localized swelling, mass and lump, left lower limb: Secondary | ICD-10-CM

## 2019-05-14 DIAGNOSIS — Z833 Family history of diabetes mellitus: Secondary | ICD-10-CM

## 2019-05-14 DIAGNOSIS — R7303 Prediabetes: Secondary | ICD-10-CM | POA: Insufficient documentation

## 2019-05-14 DIAGNOSIS — Z6841 Body Mass Index (BMI) 40.0 and over, adult: Secondary | ICD-10-CM

## 2019-05-14 LAB — CBC WITH DIFFERENTIAL/PLATELET
Absolute Monocytes: 476 cells/uL (ref 200–950)
Basophils Absolute: 42 cells/uL (ref 0–200)
Basophils Relative: 0.6 %
Eosinophils Absolute: 525 cells/uL — ABNORMAL HIGH (ref 15–500)
Eosinophils Relative: 7.5 %
HCT: 42.3 % (ref 35.0–45.0)
Hemoglobin: 13.6 g/dL (ref 11.7–15.5)
Lymphs Abs: 1540 cells/uL (ref 850–3900)
MCH: 27.5 pg (ref 27.0–33.0)
MCHC: 32.2 g/dL (ref 32.0–36.0)
MCV: 85.6 fL (ref 80.0–100.0)
MPV: 9.9 fL (ref 7.5–12.5)
Monocytes Relative: 6.8 %
Neutro Abs: 4417 cells/uL (ref 1500–7800)
Neutrophils Relative %: 63.1 %
Platelets: 239 10*3/uL (ref 140–400)
RBC: 4.94 10*6/uL (ref 3.80–5.10)
RDW: 15.7 % — ABNORMAL HIGH (ref 11.0–15.0)
Total Lymphocyte: 22 %
WBC: 7 10*3/uL (ref 3.8–10.8)

## 2019-05-14 LAB — COMPLETE METABOLIC PANEL WITH GFR
AG Ratio: 1.2 (calc) (ref 1.0–2.5)
ALT: 8 U/L (ref 6–29)
AST: 13 U/L (ref 10–35)
Albumin: 3.9 g/dL (ref 3.6–5.1)
Alkaline phosphatase (APISO): 88 U/L (ref 37–153)
BUN/Creatinine Ratio: 18 (calc) (ref 6–22)
BUN: 20 mg/dL (ref 7–25)
CO2: 29 mmol/L (ref 20–32)
Calcium: 10.1 mg/dL (ref 8.6–10.4)
Chloride: 101 mmol/L (ref 98–110)
Creat: 1.11 mg/dL — ABNORMAL HIGH (ref 0.60–0.88)
GFR, Est African American: 54 mL/min/{1.73_m2} — ABNORMAL LOW (ref >=60)
GFR, Est Non African American: 47 mL/min/{1.73_m2} — ABNORMAL LOW (ref >=60)
Globulin: 3.3 g/dL (calc) (ref 1.9–3.7)
Glucose, Bld: 95 mg/dL (ref 65–99)
Potassium: 4.3 mmol/L (ref 3.5–5.3)
Sodium: 138 mmol/L (ref 135–146)
Total Bilirubin: 0.6 mg/dL (ref 0.2–1.2)
Total Protein: 7.2 g/dL (ref 6.1–8.1)

## 2019-05-14 LAB — LIPID PANEL
Cholesterol: 181 mg/dL (ref <200)
HDL: 59 mg/dL (ref >=50)
LDL Cholesterol (Calc): 105 mg/dL (calc) — ABNORMAL HIGH
Non-HDL Cholesterol (Calc): 122 mg/dL (calc) (ref <130)
Total CHOL/HDL Ratio: 3.1 (calc) (ref <5.0)
Triglycerides: 83 mg/dL (ref <150)

## 2019-05-14 LAB — POCT GLYCOSYLATED HEMOGLOBIN (HGB A1C): Hemoglobin A1C: 5.8 % — AB (ref 4.0–5.6)

## 2019-05-14 NOTE — Assessment & Plan Note (Signed)
Newly diagnosed at visit today.  Reports had an A1C checked every 3 months with her previous PCP.  Reports strong family history of diabetes.    Plan: 1. Continue working on dietary and lifestyle modifications 2. Will follow up in 3 months for next A1C re-check

## 2019-05-14 NOTE — Assessment & Plan Note (Signed)
Localized swelling without s/s of DVT, present x > 1 year.  Discussed likely lipoma but will have ultrasound completed for full evaluation.  Plan: 1. [place] of left lower extremity ordered

## 2019-05-14 NOTE — Patient Instructions (Addendum)
As we discussed, have your labs drawn in the next 1-2 weeks and we will contact you with the results.  Try to get exercise a minimum of 30 minutes per day at least 5 days per week as well as  adequate water intake all while measuring blood pressure a few times per week.  Keep a blood pressure log and bring back to clinic at your next visit.  If your readings are consistently over 140/90 to contact our office/send me a MyChart message and we will see you sooner.  Can try DASH and Mediterranean diet options, avoiding processed foods, lowering sodium intake, avoiding pork products, and eating a plant based diet for optimal health.   We will plan to see you back in 3 months for hypertension and prediabetes follow up  You will receive a survey after today's visit either digitally by e-mail or paper by USPS mail. Your experiences and feedback matter to [place].  Please respond so we know how we are doing as we provide care for you.  Call us with any questions/concerns/needs.  It is my goal to be available to you for your health concerns.  Thanks for choosing me to be a partner in your healthcare needs!  Charlaine Dalton, FNP-C Family Nurse Practitioner [facility] Phone: [phone]

## 2019-05-14 NOTE — Progress Notes (Signed)
Subjective:    Patient ID: Lindsey Jacobs, female    DOB: [birth year], 84 y.o.   MRN: [id number]  Lindsey Jacobs is a 84 y.o. female presenting on 05/14/2019 for Cyst (the patient complains about a knot behind her left upper calf. She denies pain, but notice some intermittent discomfort x 1 yr. She state the knot have changed in size of time. ) and Hypertension   HPI  Hypertension - She is checking BP at home or outside of clinic.  Readings "good at home" - Current medications: losartan 50mg  daily, triamterene-hydrochlorothiazide 37.5-25mg  tablet, tolerating well without side effects - She is symptomatic with some visual changes. - Pt denies headache, lightheadedness, dizziness, chest tightness/pressure, palpitations, leg swelling, sudden loss of speech or loss of consciousness. - She  reports no regular exercise routine. - Her diet is moderate in salt, moderate in fat, and moderate in carbohydrates.  Reports visual changes and eye exam scheduled for mid May 2021.  Has large family history of diabetes without prior diagnosis of diabetes from her previous PCP in [place], [place].    Depression screen [facility] 2/9 03/14/2019 02/03/2019 10/15/2018  Decreased Interest 0 0 0  Down, Depressed, Hopeless 0 0 0  PHQ - 2 Score 0 0 0  Altered sleeping 0 0 0  Tired, decreased energy 0 0 0  Change in appetite 0 0 0  Feeling bad or failure about yourself  0 0 0  Trouble concentrating 0 0 0  Moving slowly or fidgety/restless 0 0 0  Suicidal thoughts 0 0 0  PHQ-9 Score 0 0 0  Difficult doing work/chores Not difficult at all Not difficult at all Not difficult at all    Social History   Tobacco Use  . Smoking status: Never Smoker  . Smokeless tobacco: Never Used  Substance Use Topics  . Alcohol use: Never  . Drug use: Never    Review of Systems  Constitutional: Negative.   HENT: Negative.   Eyes: Negative.        Visual changes  Respiratory: Negative.   Cardiovascular: Negative.     Gastrointestinal: Negative.   Endocrine: Negative.   Genitourinary: Negative.   Musculoskeletal: Negative.   Skin: Negative.        Swelling behind left outer knee  Allergic/Immunologic: Negative.   Neurological: Negative.   Hematological: Negative.   Psychiatric/Behavioral: Negative.    Per HPI unless specifically indicated above     Objective:    BP 139/68 (BP Location: Left Arm, Patient Position: Sitting, Cuff Size: Large)   Pulse 77   Temp (!) 97.1 F (36.2 C) (Temporal)   Ht 5\' 5"  (1.651 m)   Wt (!) 317 lb (143.8 kg)   SpO2 96%   BMI 52.75 kg/m   Wt Readings from Last 3 Encounters:  05/14/19 (!) 317 lb (143.8 kg)  10/15/18 (!) 321 lb 1.6 oz (145.7 kg)  02/06/18 (!) 330 lb 8 oz (149.9 kg)    Physical Exam Vitals reviewed.  Constitutional:      General: She is not in acute distress.    Appearance: Normal appearance. She is well-developed and well-groomed. She is obese. She is not ill-appearing or toxic-appearing.  HENT:     Head: Normocephalic.  Eyes:     General: Lids are normal. Vision grossly intact.        Right eye: No discharge.        Left eye: No discharge.     Extraocular Movements: Extraocular movements intact.  Conjunctiva/sclera: Conjunctivae normal.     Pupils: Pupils are equal, round, and reactive to light.  Cardiovascular:     Rate and Rhythm: Normal rate and regular rhythm.     Pulses: Normal pulses.          Dorsalis pedis pulses are 2+ on the right side and 2+ on the left side.       Posterior tibial pulses are 2+ on the right side and 2+ on the left side.     Heart sounds: Normal heart sounds. No murmur. No friction rub. No gallop.   Pulmonary:     Effort: Pulmonary effort is normal. No respiratory distress.     Breath sounds: Normal breath sounds.  Abdominal:     General: Abdomen is flat. Bowel sounds are normal. There is no distension.     Palpations: Abdomen is soft.  Musculoskeletal:        General: No tenderness.     Right  lower leg: No swelling. No edema.     Left lower leg: Swelling present. No edema.       Legs:     Comments: Circular swelling behind left outer upper calf x 1 year without pain/redness/swelling/warmth  Feet:     Right foot:     Skin integrity: Skin integrity normal.     Left foot:     Skin integrity: Skin integrity normal.  Skin:    General: Skin is warm and dry.     Capillary Refill: Capillary refill takes less than 2 seconds.  Neurological:     General: No focal deficit present.     Mental Status: She is alert and oriented to person, place, and time.     Cranial Nerves: No cranial nerve deficit.     Sensory: No sensory deficit.     Motor: No weakness.     Coordination: Coordination normal.     Gait: Gait normal.  Psychiatric:        Attention and Perception: Attention and perception normal.        Mood and Affect: Mood and affect normal.        Speech: Speech normal.        Behavior: Behavior normal. Behavior is cooperative.        Thought Content: Thought content normal.        Cognition and Memory: Cognition and memory normal.        Judgment: Judgment normal.     Results for orders placed or performed in visit on 05/14/19  POCT HgB A1C  Result Value Ref Range   Hemoglobin A1C 5.8 (A) 4.0 - 5.6 %   HbA1c POC (<> result, manual entry)     HbA1c, POC (prediabetic range)     HbA1c, POC (controlled diabetic range)        Assessment & Plan:   Problem List Items Addressed This Visit      Cardiovascular and Mediastinum   Essential hypertension - Primary    Slightly elevated blood pressure in clinic this morning.  Did not take medication before coming to clinic today.  BP is not at goal < 130/80.  Pt is working on lifestyle modifications.  Taking medications tolerating well without side effects.   Plan: 1. Continue taking losartan 50mg  daily and triamterene-hydrochlorothiazide 37.5-25mg  tablet daily 2. Obtain labs today  3. Encouraged heart healthy diet and increasing  exercise to 30 minutes most days of the week, going no more than 2 days in a row without exercise. 4.  Check BP 1-2 x per week at home, keep log, and bring to clinic at next appointment. 5. Follow up 6 months.         Relevant Orders   CBC with Differential   COMPLETE METABOLIC PANEL WITH GFR   Lipid Profile     Other   Morbid obesity with BMI of 50.0-59.9, adult (HCC)   Relevant Orders   Lipid Profile   POCT HgB A1C (Completed)   Prediabetes    Newly diagnosed at visit today.  Reports had an A1C checked every 3 months with her previous PCP.  Reports strong family history of diabetes.    Plan: 1. Continue working on dietary and lifestyle modifications 2. Will follow up in 3 months for next A1C re-check      Localized swelling of left lower leg    Localized swelling without s/s of DVT, present x > 1 year.  Discussed likely lipoma but will have ultrasound completed for full evaluation.  Plan: 1. [place] of left lower extremity ordered      Relevant Orders   US Venous Img Lower Unilateral Left    Other Visit Diagnoses    Visual changes       Relevant Orders   POCT HgB A1C (Completed)   Family history of diabetes mellitus       Relevant Orders   POCT HgB A1C (Completed)   Elevated glucose       Relevant Orders   POCT HgB A1C (Completed)      No orders of the defined types were placed in this encounter.     Follow up plan: Return in about 3 months (around 08/13/2019) for Prediabetes and HTN f/u.   Harlin Rain, [place] [facility] Nurse Practitioner [facility] 05/14/2019, 9:54 AM

## 2019-05-14 NOTE — Assessment & Plan Note (Signed)
Slightly elevated blood pressure in clinic this morning.  Did not take medication before coming to clinic today.  BP is not at goal < 130/80.  Pt is working on lifestyle modifications.  Taking medications tolerating well without side effects.   Plan: 1. Continue taking losartan 50mg  daily and triamterene-hydrochlorothiazide 37.5-25mg  tablet daily 2. Obtain labs today  3. Encouraged heart healthy diet and increasing exercise to 30 minutes most days of the week, going no more than 2 days in a row without exercise. 4. Check BP 1-2 x per week at home, keep log, and bring to clinic at next appointment. 5. Follow up 6 months.

## 2019-05-16 ENCOUNTER — Other Ambulatory Visit
Admission: RE | Admit: 2019-05-16 | Discharge: 2019-05-16 | Disposition: A | Discharge status: Home / Self Care | Payer: Medicare Other | Source: Ambulatory Visit | Attending: Gastroenterology | Admitting: Gastroenterology

## 2019-05-16 DIAGNOSIS — Z01812 Encounter for preprocedural laboratory examination: Secondary | ICD-10-CM | POA: Diagnosis present

## 2019-05-16 DIAGNOSIS — Z20822 Contact with and (suspected) exposure to covid-19: Secondary | ICD-10-CM | POA: Diagnosis not present

## 2019-05-16 LAB — SARS CORONAVIRUS 2 (TAT 6-24 HRS): SARS Coronavirus 2: NEGATIVE

## 2019-05-19 ENCOUNTER — Ambulatory Visit
Admission: RE | Admit: 2019-05-19 | Discharge: 2019-05-19 | Disposition: A | Discharge status: Home / Self Care | Payer: Medicare Other | Source: Ambulatory Visit | Attending: Family Medicine | Admitting: Family Medicine

## 2019-05-19 ENCOUNTER — Other Ambulatory Visit: Payer: Self-pay

## 2019-05-19 ENCOUNTER — Telehealth: Payer: Self-pay | Admitting: *Deleted

## 2019-05-19 DIAGNOSIS — R2242 Localized swelling, mass and lump, left lower limb: Secondary | ICD-10-CM | POA: Diagnosis present

## 2019-05-19 IMAGING — US US EXTREM LOW VENOUS*L*
1 series · 13 of 24 positions shown · non-contrast
Comparison: None.

CLINICAL DATA: Left lower extremity edema. Circular cyst involving
the upper inner aspect the left calf for the past year. Evaluate for
DVT.



[Series 1: us venous img lower uni left (dvt) · portal-venous · 41 acquisitions, 13 frames shown]
[im 1/41]
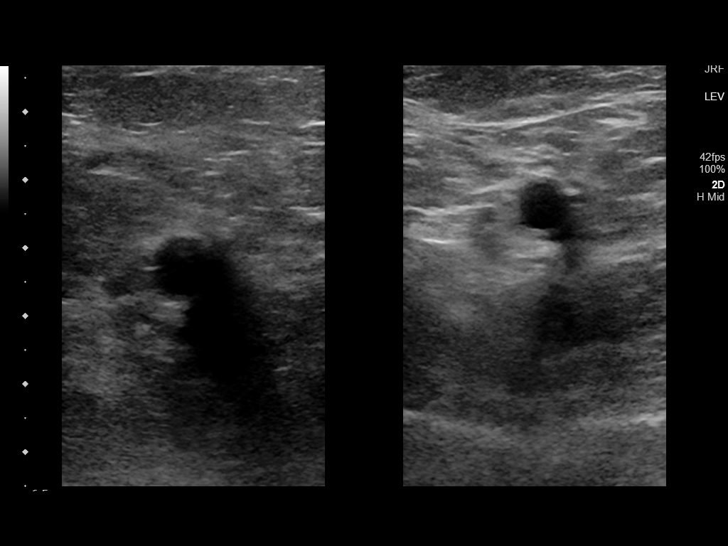
[im 4/41]
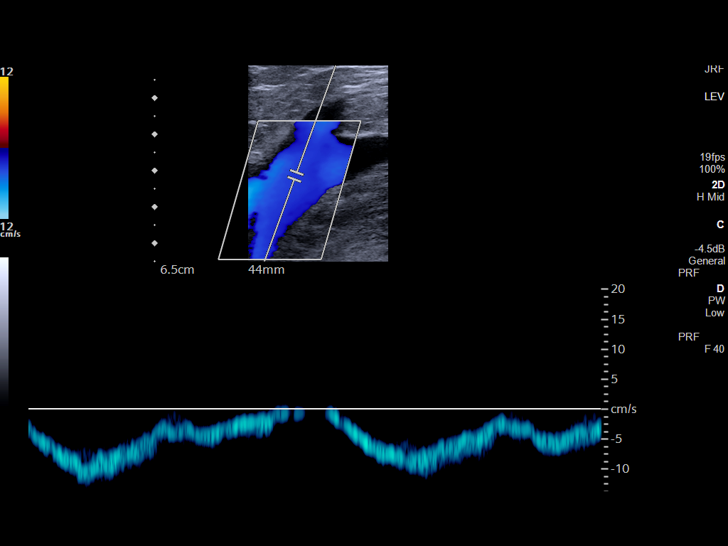
[im 7/41]
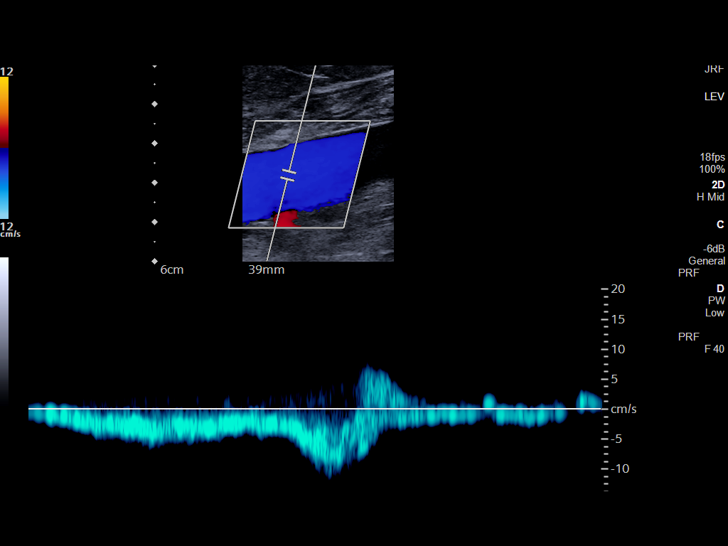
[im 11/41]
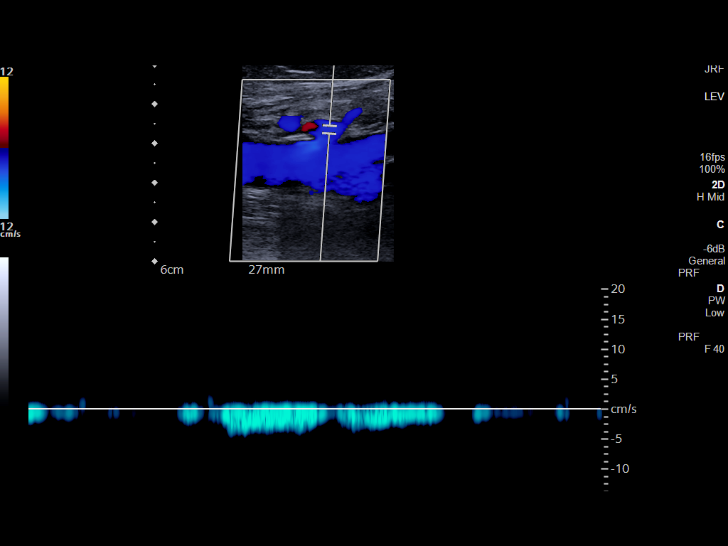
[im 14/41]
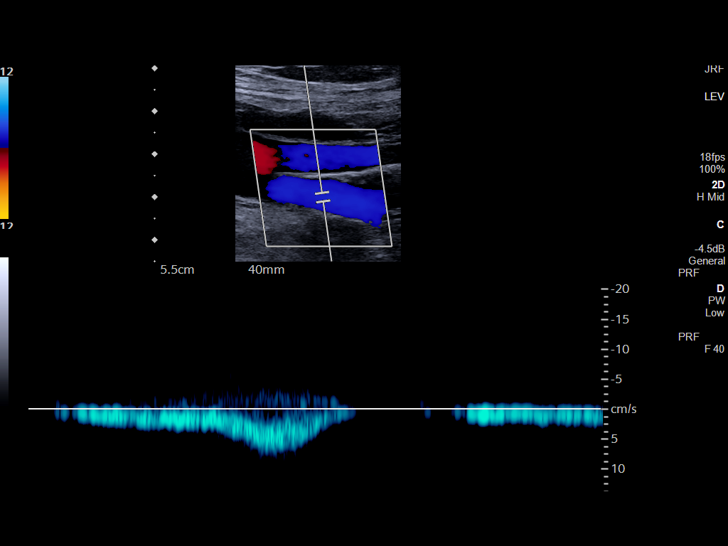
[im 18/41]
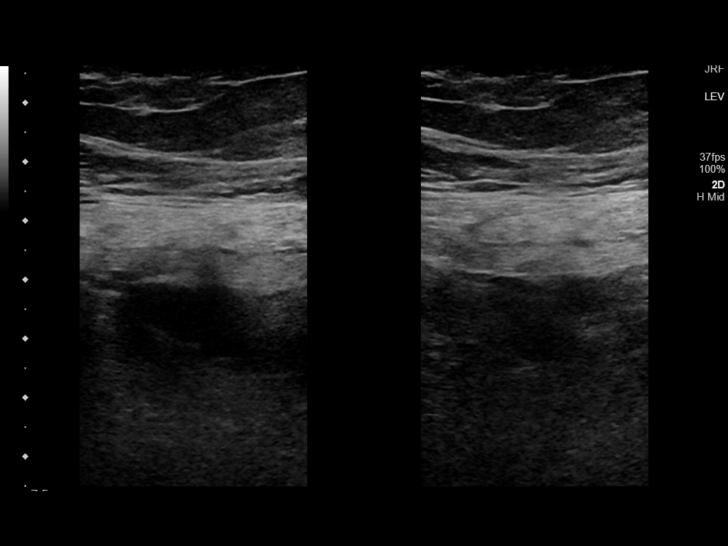
[im 23/41]
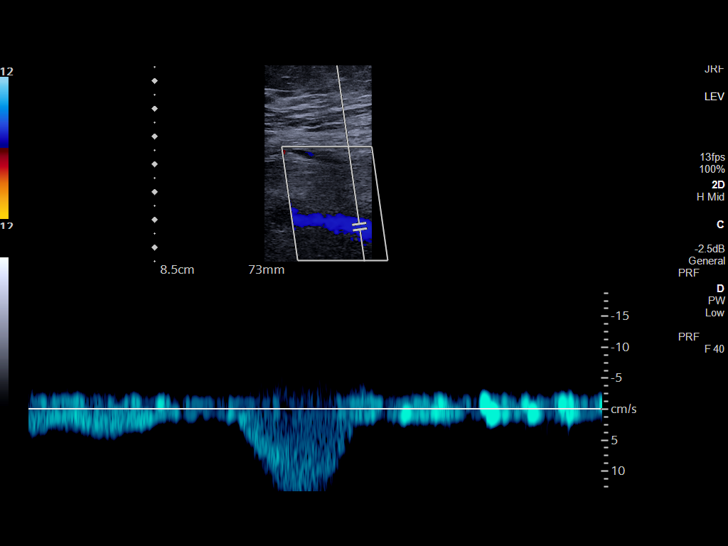
[im 25/41]
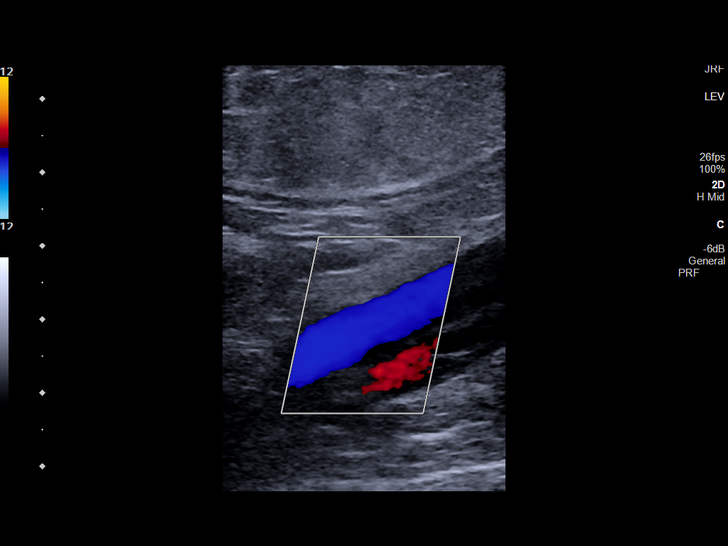
[im 28/41]
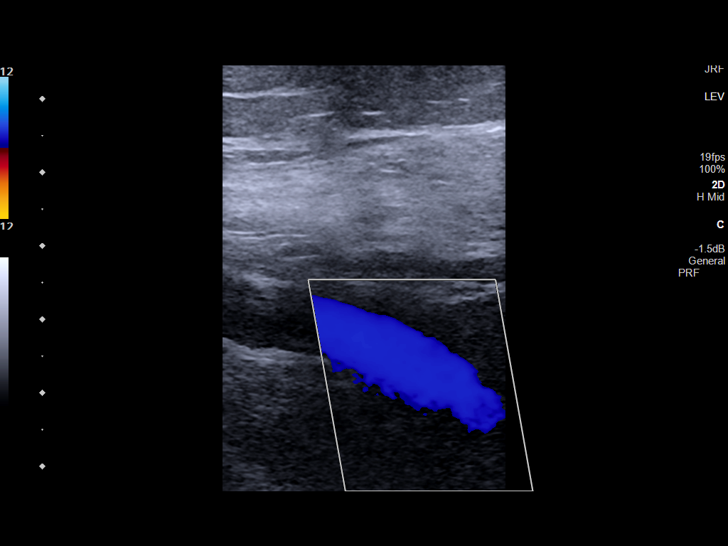
[im 32/41]
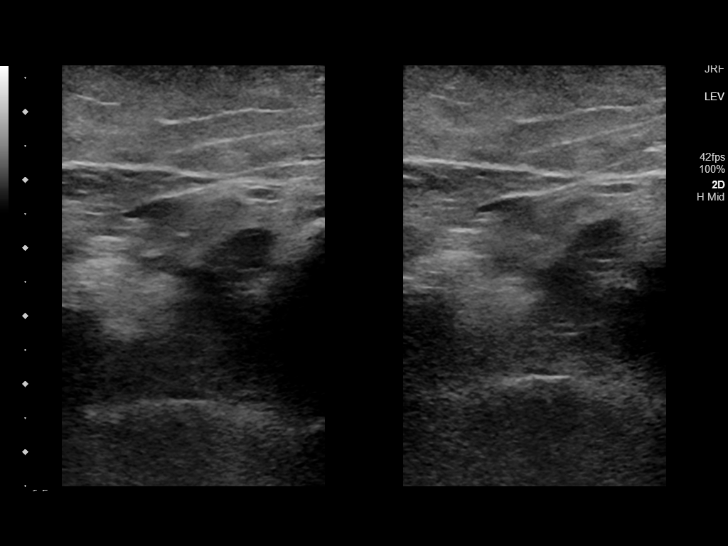
[im 35/41]
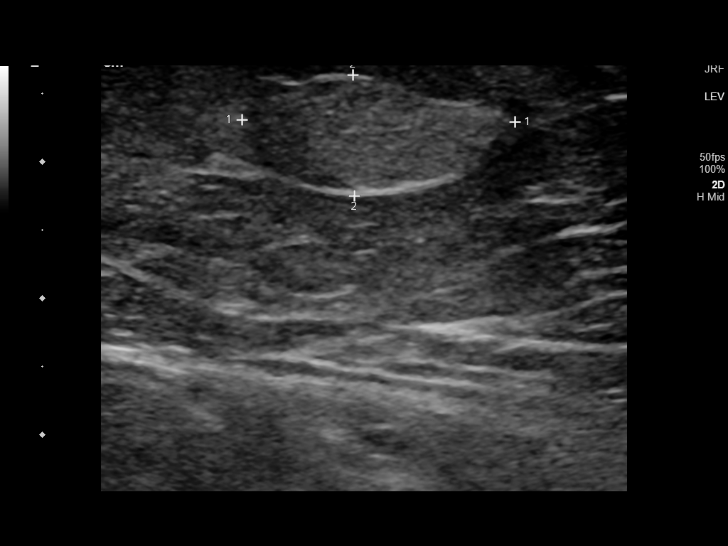
[im 37/41]
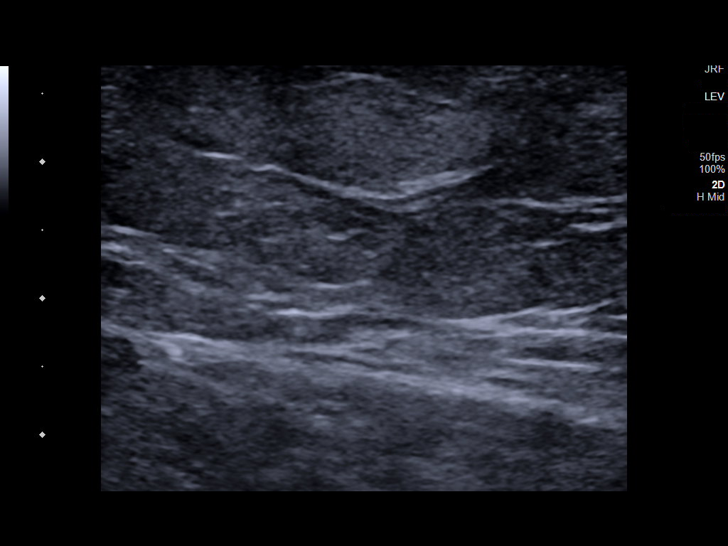
[im 41/41]
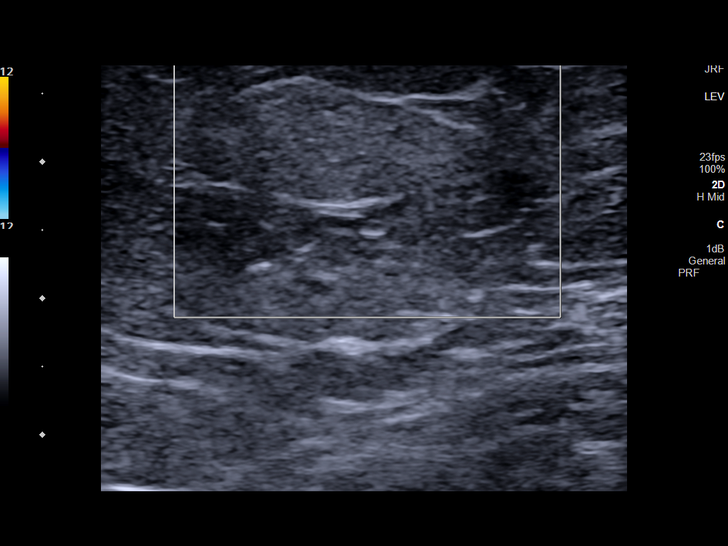

[13 of 24 positions shown; findings below may reference images not displayed]

FINDINGS: Contralateral Common Femoral Vein: Respiratory phasicity is normal
and symmetric with the symptomatic side. No evidence of thrombus.
Normal compressibility.

Common Femoral Vein: No evidence of thrombus. Normal
compressibility, respiratory phasicity and response to augmentation.

Saphenofemoral Junction: No evidence of thrombus. Normal
compressibility and flow on color Doppler imaging.

Profunda Femoral Vein: No evidence of thrombus. Normal
compressibility and flow on color Doppler imaging.

Femoral Vein: No evidence of thrombus. Normal compressibility,
respiratory phasicity and response to augmentation.

Popliteal Vein: No evidence of thrombus. Normal compressibility,
respiratory phasicity and response to augmentation.

Calf Veins: No evidence of thrombus. Normal compressibility and flow
on color Doppler imaging.

Superficial Great Saphenous Vein: No evidence of thrombus. Normal
compressibility.

Venous Reflux:  None.

Other Findings: There is a slightly ill-defined approximately 2.0 x
1.9 x 0.9 cm nodule which correlates with the patient's palpable
area of concern within the superomedial aspect the left calf (images
35 through 40). There is no definitive communication of this nodule
to the overlying dermal surface. The nodule demonstrates similar
echogenicity as the adjacent subcutaneous fat and thus is favored to
represent a lipoma.
IMPRESSION: 1. No evidence of DVT within the left lower extremity.
2. Patient's palpable area of concern appears to correlate with an
approximately 2.0 cm subcutaneous lipoma.

## 2019-05-19 NOTE — Telephone Encounter (Signed)
Patient returned call for results:  Ultrasound of left lower extremity does not show DVT. Shows lipoma in the area that Lindsey Jacobs was feeling on the upper inner left calf. No follow up needed. Patient notified and she will follow up as needed.

## 2019-05-20 ENCOUNTER — Ambulatory Visit: Payer: Medicare Other | Admitting: Registered Nurse

## 2019-05-20 ENCOUNTER — Encounter
Admission: RE | Disposition: A | Discharge status: Home / Self Care | Payer: Self-pay | Source: Home / Self Care | Attending: Gastroenterology

## 2019-05-20 ENCOUNTER — Ambulatory Visit: Payer: Medicare Other

## 2019-05-20 ENCOUNTER — Ambulatory Visit
Admission: RE | Admit: 2019-05-20 | Discharge: 2019-05-20 | Disposition: A | Discharge status: Home / Self Care | Payer: Medicare Other | Attending: Gastroenterology | Admitting: Gastroenterology

## 2019-05-20 ENCOUNTER — Encounter: Payer: Self-pay | Admitting: Gastroenterology

## 2019-05-20 DIAGNOSIS — K2950 Unspecified chronic gastritis without bleeding: Secondary | ICD-10-CM | POA: Insufficient documentation

## 2019-05-20 DIAGNOSIS — R1310 Dysphagia, unspecified: Secondary | ICD-10-CM | POA: Diagnosis present

## 2019-05-20 DIAGNOSIS — F329 Major depressive disorder, single episode, unspecified: Secondary | ICD-10-CM | POA: Diagnosis not present

## 2019-05-20 DIAGNOSIS — G4730 Sleep apnea, unspecified: Secondary | ICD-10-CM | POA: Insufficient documentation

## 2019-05-20 DIAGNOSIS — Z96653 Presence of artificial knee joint, bilateral: Secondary | ICD-10-CM | POA: Insufficient documentation

## 2019-05-20 DIAGNOSIS — E785 Hyperlipidemia, unspecified: Secondary | ICD-10-CM | POA: Insufficient documentation

## 2019-05-20 DIAGNOSIS — K317 Polyp of stomach and duodenum: Secondary | ICD-10-CM | POA: Insufficient documentation

## 2019-05-20 DIAGNOSIS — Z79899 Other long term (current) drug therapy: Secondary | ICD-10-CM | POA: Diagnosis not present

## 2019-05-20 DIAGNOSIS — Z791 Long term (current) use of non-steroidal anti-inflammatories (NSAID): Secondary | ICD-10-CM | POA: Diagnosis not present

## 2019-05-20 DIAGNOSIS — K2980 Duodenitis without bleeding: Secondary | ICD-10-CM | POA: Diagnosis not present

## 2019-05-20 DIAGNOSIS — K2940 Chronic atrophic gastritis without bleeding: Secondary | ICD-10-CM

## 2019-05-20 DIAGNOSIS — I10 Essential (primary) hypertension: Secondary | ICD-10-CM | POA: Insufficient documentation

## 2019-05-20 DIAGNOSIS — K219 Gastro-esophageal reflux disease without esophagitis: Secondary | ICD-10-CM | POA: Insufficient documentation

## 2019-05-20 DIAGNOSIS — K449 Diaphragmatic hernia without obstruction or gangrene: Secondary | ICD-10-CM

## 2019-05-20 DIAGNOSIS — R1319 Other dysphagia: Secondary | ICD-10-CM

## 2019-05-20 HISTORY — PX: ESOPHAGOGASTRODUODENOSCOPY (EGD) WITH PROPOFOL: SHX5813

## 2019-05-20 SURGERY — ESOPHAGOGASTRODUODENOSCOPY (EGD) WITH PROPOFOL
Anesthesia: General

## 2019-05-20 MED ORDER — PROPOFOL 500 MG/50ML IV EMUL
INTRAVENOUS | Status: AC
  Filled 2019-05-20: qty 100

## 2019-05-20 MED ORDER — LIDOCAINE HCL (PF) 2 % IJ SOLN
INTRAMUSCULAR | Status: AC
  Filled 2019-05-20: qty 10

## 2019-05-20 MED ORDER — PROPOFOL 500 MG/50ML IV EMUL
INTRAVENOUS | Status: AC
  Filled 2019-05-20: qty 150

## 2019-05-20 MED ORDER — PROPOFOL 10 MG/ML IV BOLUS
INTRAVENOUS | Status: DC | PRN
  Administered 2019-05-20: 20 mg via INTRAVENOUS
  Administered 2019-05-20: 40 mg via INTRAVENOUS
  Administered 2019-05-20: 80 mg via INTRAVENOUS
  Administered 2019-05-20: 40 mg via INTRAVENOUS
  Administered 2019-05-20 (×2): 20 mg via INTRAVENOUS

## 2019-05-20 MED ORDER — LIDOCAINE HCL (CARDIAC) PF 100 MG/5ML IV SOSY
PREFILLED_SYRINGE | INTRAVENOUS | Status: DC | PRN
  Administered 2019-05-20: 100 mg via INTRAVENOUS

## 2019-05-20 MED ORDER — PROPOFOL 500 MG/50ML IV EMUL
INTRAVENOUS | Status: AC
  Filled 2019-05-20: qty 50

## 2019-05-20 MED ORDER — SODIUM CHLORIDE 0.9 % IV SOLN: INTRAVENOUS | Status: DC | PRN

## 2019-05-20 MED ORDER — PHENYLEPHRINE HCL (PRESSORS) 10 MG/ML IV SOLN
INTRAVENOUS | Status: AC
  Filled 2019-05-20: qty 1

## 2019-05-20 MED ORDER — LIDOCAINE HCL URETHRAL/MUCOSAL 2 % EX GEL
CUTANEOUS | Status: AC
  Filled 2019-05-20: qty 20

## 2019-05-20 MED ORDER — LIDOCAINE HCL (PF) 2 % IJ SOLN
INTRAMUSCULAR | Status: AC
  Filled 2019-05-20: qty 30

## 2019-05-20 NOTE — Anesthesia Preprocedure Evaluation (Addendum)
Anesthesia Evaluation  Patient identified by MRN, date of birth, ID band Patient awake    Reviewed: Allergy & Precautions, NPO status , Patient's Chart, lab work & pertinent test results  History of Anesthesia Complications Negative for: history of anesthetic complications  Airway Mallampati: II       Dental  (+) Partial Lower, Partial Upper   Pulmonary sleep apnea and Continuous Positive Airway Pressure Ventilation , neg COPD, Not current smoker,           Cardiovascular hypertension, Pt. on medications (-) Past MI (-) dysrhythmias (-) Valvular Problems/Murmurs     Neuro/Psych neg Seizures Depression    GI/Hepatic Neg liver ROS, GERD  Medicated and Controlled,  Endo/Other  neg diabetes  Renal/GU negative Renal ROS     Musculoskeletal   Abdominal   Peds  Hematology   Anesthesia Other Findings   Reproductive/Obstetrics                            Anesthesia Physical Anesthesia Plan  ASA: III  Anesthesia Plan: General   Post-op Pain Management:    Induction:   PONV Risk Score and Plan: 3 and Propofol infusion, TIVA and Treatment may vary due to age or medical condition  Airway Management Planned: Nasal Cannula  Additional Equipment:   Intra-op Plan:   Post-operative Plan:   Informed Consent: I have reviewed the patients History and Physical, chart, labs and discussed the procedure including the risks, benefits and alternatives for the proposed anesthesia with the patient or authorized representative who has indicated his/her understanding and acceptance.       Plan Discussed with:   Anesthesia Plan Comments:         Anesthesia Quick Evaluation

## 2019-05-20 NOTE — H&P (Signed)
Vonda Antigua, MD [address] [place], [place], [place], [address] [id number], [place], [place], [place], [address] Phone: [phone]  Fax: [phone]  Primary Care Physician:  Verl Bangs, FNP   Pre-Procedure History & Physical: HPI:  Lindsey Jacobs is a 84 y.o. female is here for an EGD.   Past Medical History:  Diagnosis Date  . Allergy   . Colon polyps   . Depression   . GERD (gastroesophageal reflux disease)   . Glaucoma   . Hyperlipidemia   . Hypertension   . Sleep apnea   . Urinary incontinence     Past Surgical History:  Procedure Laterality Date  . ABDOMINAL HYSTERECTOMY  1984  . CARPAL TUNNEL RELEASE Right 1990  . CATARACT EXTRACTION, BILATERAL Bilateral 2004  . CESAREAN SECTION  1974  . CHOLECYSTECTOMY  1974  . EYE SURGERY    . JOINT REPLACEMENT    . REPLACEMENT TOTAL KNEE Left 2007  . REPLACEMENT TOTAL KNEE BILATERAL Right 2006  . [place]    Prior to Admission medications   Medication Sig Start Date End Date Taking? Authorizing Provider  brimonidine (ALPHAGAN) 0.2 % ophthalmic solution Place 1 drop into both eyes 3 (three) times daily.   Yes [provider]  Cholecalciferol (VITAMIN D3 MAXIMUM STRENGTH PO) Take by mouth.   Yes [provider]  dorzolamide-timolol (COSOPT) 22.3-6.8 MG/ML ophthalmic solution  06/17/18  Yes [provider]  DULoxetine (CYMBALTA) 60 MG capsule TAKE 1 CAPSULE DAILY (NEED APPOINTMENT OCTOBER) 12/18/18  Yes Karamalegos, Devonne Doughty, DO  losartan (COZAAR) 50 MG tablet TAKE 1 TABLET DAILY 01/20/19  Yes Karamalegos, Devonne Doughty, DO  Magnesium Oxide 500 MG TABS Take by mouth. Patient is taking 2 tablet once daily.   Yes [provider]  Multiple Vitamins-Minerals (ADVANCED EYE HEALTH PO) Take by mouth.   Yes [provider]  pantoprazole (PROTONIX) 40 MG tablet TAKE 1 TABLET DAILY 07/17/18  Yes Karamalegos, Devonne Doughty, DO    triamterene-hydrochlorothiazide (MAXZIDE-25) 37.5-25 MG tablet TAKE 1 TABLET DAILY (DOSE CHANGED FROM 75-25. SHOULD BE TAKING 1 WHOLE TABLET 37.5-25 MG) 05/08/19  Yes Mikey College, NP  trimethoprim-polymyxin b (POLYTRIM) ophthalmic solution Place 1 drop into both eyes every 4 (four) hours. 05/30/18  Yes Karamalegos, Devonne Doughty, DO  diclofenac sodium (VOLTAREN) 1 % GEL Apply 4 g topically 4 (four) times daily. 02/07/18   Mikey College, NP  naproxen (NAPROSYN) 500 MG tablet Take by mouth. 01/04/16   [provider]  Propylene Glycol-Glycerin (SOOTHE) 0.6-0.6 % SOLN Apply to eye.    [provider]    Allergies as of 04/03/2019 - Review Complete 03/25/2019  Allergen Reaction Noted  . Amoxapine and related  05/13/2018  . Diamox [acetazolamide]  10/15/2018  . Keflex [cephalexin] Rash 04/29/2018  . Tape Rash 02/06/2018    Family History  Problem Relation Age of Onset  . Heart disease Father   . Colon polyps Father   . Alcohol abuse Father     Social History   Socioeconomic History  . Marital status: Widowed    Spouse name: Not on file  . Number of children: Not on file  . Years of education: Not on file  . Highest education level: Not on file  Occupational History  . Not on file  Tobacco Use  . Smoking status: Never Smoker  . Smokeless tobacco: Never Used  Substance and Sexual Activity  . Alcohol use: Never  . Drug  use: Never  . Sexual activity: Not on file  Other Topics Concern  . Not on file  Social History Narrative  . Not on file   Social Determinants of Health   Financial Resource Strain:   . Difficulty of Paying Living Expenses:   Food Insecurity:   . Worried About Programme researcher, broadcasting/film/video in the Last Year:   . Barista in the Last Year:   Transportation Needs:   . Freight forwarder (Medical):   Marland Kitchen Lack of Transportation (Non-Medical):   Physical Activity:   . Days of Exercise per Week:   . Minutes of Exercise per  Session:   Stress:   . Feeling of Stress :   Social Connections:   . Frequency of Communication with Friends and Family:   . Frequency of Social Gatherings with Friends and Family:   . Attends Religious Services:   . Active Member of Clubs or Organizations:   . Attends Banker Meetings:   Marland Kitchen Marital Status:   Intimate Partner Violence:   . Fear of Current or Ex-Partner:   . Emotionally Abused:   Marland Kitchen Physically Abused:   . Sexually Abused:     Review of Systems: See HPI, otherwise negative ROS  Physical Exam: BP 131/82   Pulse (!) 57   Temp (!) 96.5 F (35.8 C) (Temporal)   Resp 20   Ht 5' 4.5" (1.638 m)   Wt (!) 144.5 kg   LMP  (LMP Unknown)   SpO2 98%   BMI 53.85 kg/m  General:   Alert,  pleasant and cooperative in NAD Head:  Normocephalic and atraumatic. Neck:  Supple; no masses or thyromegaly. Lungs:  Clear throughout to auscultation, normal respiratory effort.    Heart:  +S1, +S2, Regular rate and rhythm, No edema. Abdomen:  Soft, nontender and nondistended. Normal bowel sounds, without guarding, and without rebound.   Neurologic:  Alert and  oriented x4;  grossly normal neurologically.  Impression/Plan: Lindsey Jacobs is here for an EGD for dysphagia  Risks, benefits, limitations, and alternatives regarding the procedure have been reviewed with the patient.  Questions have been answered.  All parties agreeable.   Pasty Spillers, MD  05/20/2019, 8:10 AM

## 2019-05-20 NOTE — Op Note (Signed)
[facility] Gastroenterology Patient Name: Lindsey Jacobs Procedure Date: 05/20/2019 7:46 AM MRN: [id number] Account #: [id number] Date of Birth: 1935/07/02 Admit Type: Outpatient Age: 84 Room: [facility] ENDO ROOM 2 Gender: Female Note Status: Finalized Procedure:             Upper GI endoscopy Indications:           Dysphagia Providers:             Angelissa Supan B. Maximino Greenland MD, MD Referring MD:          Jodelle Gross. [place] (Referring MD) Medicines:             Monitored Anesthesia Care Complications:         No immediate complications. Procedure:             Pre-Anesthesia Assessment:                        - The risks and benefits of the procedure and the                         sedation options and risks were discussed with the                         patient. All questions were answered and informed                         consent was obtained.                        - Patient identification and proposed procedure were                         verified prior to the procedure.                        - ASA Grade Assessment: II - A patient with mild                         systemic disease.                        After obtaining informed consent, the endoscope was                         passed under direct vision. Throughout the procedure,                         the patient's blood pressure, pulse, and oxygen                         saturations were monitored continuously. The Endoscope                         was introduced through the mouth, and advanced to the                         second part of duodenum. The upper GI endoscopy was                         accomplished with ease. The  patient tolerated the                         procedure well. Findings:      The examined esophagus was normal. Biopsies were obtained from the       proximal and distal esophagus with cold forceps for histology of       suspected eosinophilic esophagitis.      A small hiatal hernia was  present.      A single 5 mm sessile polyp with no bleeding and no stigmata of recent       bleeding was found in the gastric fundus. Biopsies were taken with a       cold forceps for histology.      Patchy atrophic mucosa was found in the stomach. Biopsies were obtained       at the incisura and in the gastric antrum with cold forceps for       Helicobacter pylori testing.      Mildly scalloped mucosa was found in the second portion of the duodenum.       Biopsies were taken with a cold forceps for histology.      The duodenal bulb was normal. Impression:            - Normal esophagus. Biopsied.                        - Small hiatal hernia.                        - A single gastric polyp. Biopsied.                        - Gastric mucosal atrophy.                        - Scalloped mucosa was found in the duodenum. Biopsied.                        - Normal duodenal bulb.                        - Biopsies were obtained at the incisura and in the                         gastric antrum. Recommendation:        - Await pathology results.                        - Return to my office in 4 weeks.                        - Continue present medications.                        - The findings and recommendations were discussed with                         the patient.                        - The findings and recommendations were discussed with  the patient's family. Procedure Code(s):     --- Professional ---                        [phone], Esophagogastroduodenoscopy, flexible,                         transoral; with biopsy, single or multiple Diagnosis Code(s):     --- Professional ---                        K44.9, Diaphragmatic hernia without obstruction or                         gangrene                        K31.89, Other diseases of stomach and duodenum                        R13.10, Dysphagia, unspecified CPT copyright 2019 American Medical Association. All rights  reserved. The codes documented in this report are preliminary and upon coder review may  be revised to meet current compliance requirements.  Melodie Bouillon, MD Michel Bickers B. Maximino Greenland MD, MD 05/20/2019 8:45:19 AM This report has been signed electronically. Number of Addenda: 0 Note Initiated On: 05/20/2019 7:46 AM Estimated Blood Loss:  Estimated blood loss: none.      [facility]

## 2019-05-20 NOTE — Transfer of Care (Signed)
Immediate Anesthesia Transfer of Care Note  Patient: Lindsey Jacobs  Procedure(s) Performed: ESOPHAGOGASTRODUODENOSCOPY (EGD) WITH PROPOFOL (N/A )  Patient Location: PACU  Anesthesia Type:General  Level of Consciousness: sedated  Airway & Oxygen Therapy: Patient Spontanous Breathing and Patient connected to nasal cannula oxygen  Post-op Assessment: Report given to RN and Post -op Vital signs reviewed and stable  Post vital signs: Reviewed and stable  Last Vitals:  Vitals Value Taken Time  BP 114/73 05/20/19 0833  Temp    Pulse 77 05/20/19 0833  Resp 22 05/20/19 0833  SpO2 96 % 05/20/19 0833    Last Pain:  Vitals:   05/20/19 0726  TempSrc: Temporal         Complications: No apparent anesthesia complications

## 2019-05-20 NOTE — Anesthesia Postprocedure Evaluation (Signed)
Anesthesia Post Note  Patient: Lindsey Jacobs  Procedure(s) Performed: ESOPHAGOGASTRODUODENOSCOPY (EGD) WITH PROPOFOL (N/A )  Patient location during evaluation: Endoscopy Anesthesia Type: General Level of consciousness: awake and alert Pain management: pain level controlled Vital Signs Assessment: post-procedure vital signs reviewed and stable Respiratory status: spontaneous breathing and respiratory function stable Cardiovascular status: stable Anesthetic complications: no     Last Vitals:  Vitals:   05/20/19 0833 05/20/19 0843  BP: 114/73 99/74  Pulse: 78 88  Resp: (!) 26 18  Temp: (!) 35.6 C   SpO2: 95% 96%    Last Pain:  Vitals:   05/20/19 0833  TempSrc: Temporal  PainSc: Asleep                 [place] Virrueta K

## 2019-05-21 ENCOUNTER — Ambulatory Visit: Payer: Medicare Other

## 2019-05-21 ENCOUNTER — Encounter: Payer: Self-pay | Admitting: *Deleted

## 2019-05-21 LAB — SURGICAL PATHOLOGY

## 2019-05-23 ENCOUNTER — Other Ambulatory Visit: Payer: Self-pay | Admitting: Nurse Practitioner

## 2019-05-23 DIAGNOSIS — F324 Major depressive disorder, single episode, in partial remission: Secondary | ICD-10-CM

## 2019-05-23 DIAGNOSIS — K219 Gastro-esophageal reflux disease without esophagitis: Secondary | ICD-10-CM

## 2019-05-26 ENCOUNTER — Encounter: Payer: Self-pay | Admitting: Gastroenterology

## 2019-06-19 ENCOUNTER — Telehealth: Payer: Self-pay | Admitting: Gastroenterology

## 2019-06-19 NOTE — Telephone Encounter (Signed)
LVM for patient to call office to schedule appt

## 2019-06-20 ENCOUNTER — Other Ambulatory Visit: Payer: Self-pay

## 2019-06-23 ENCOUNTER — Other Ambulatory Visit: Payer: Self-pay

## 2019-06-23 ENCOUNTER — Telehealth: Payer: Self-pay

## 2019-06-23 ENCOUNTER — Encounter: Payer: Self-pay | Admitting: Gastroenterology

## 2019-06-23 ENCOUNTER — Ambulatory Visit (INDEPENDENT_AMBULATORY_CARE_PROVIDER_SITE_OTHER): Payer: Medicare Other | Admitting: Gastroenterology

## 2019-06-23 VITALS — BP 130/80 | HR 66 | Temp 97.9°F

## 2019-06-23 DIAGNOSIS — R1310 Dysphagia, unspecified: Secondary | ICD-10-CM | POA: Diagnosis not present

## 2019-06-23 NOTE — Patient Instructions (Signed)
Your referral will be sent to [facility]. They will be contatcting you with an appointment date and time.

## 2019-06-23 NOTE — Telephone Encounter (Signed)
Referral was sent to [facility] and they will contact the patient to schedule appointment.

## 2019-06-23 NOTE — Progress Notes (Signed)
Vonda Antigua, MD [address]  [place]  [place], [place] [address]  Main: [phone]  Fax: [phone]   Primary Care Physician: Verl Bangs, FNP   Chief Complaint  Patient presents with  . Dysphagia    Patient stated that she continues to get chocked up with just plain water.    HPI: Lindsey Jacobs is a 84 y.o. female here for follow-up of dysphagia to water only.  Very episodic symptoms.  No dysphagia to anything else.  Her symptom is described as "spewing of water".  EGD unrevealing.  States symptoms occur maybe about once a month and not any more frequently.  No weight loss.  Current Outpatient Medications  Medication Sig Dispense Refill  . brimonidine (ALPHAGAN) 0.2 % ophthalmic solution Place 1 drop into both eyes 3 (three) times daily.    . Cholecalciferol (VITAMIN D3 MAXIMUM STRENGTH PO) Take by mouth.    . diclofenac sodium (VOLTAREN) 1 % GEL Apply 4 g topically 4 (four) times daily. 200 g 5  . dorzolamide-timolol (COSOPT) 22.3-6.8 MG/ML ophthalmic solution     . DULoxetine (CYMBALTA) 60 MG capsule TAKE 1 CAPSULE DAILY (NEED APPOINTMENT IN OCTOBER) 90 capsule 0  . losartan (COZAAR) 50 MG tablet TAKE 1 TABLET DAILY 90 tablet 1  . Magnesium Oxide 500 MG TABS Take by mouth. Patient is taking 2 tablet once daily.    . Multiple Vitamins-Minerals (ADVANCED EYE HEALTH PO) Take by mouth.    . naproxen (NAPROSYN) 500 MG tablet Take by mouth.    . pantoprazole (PROTONIX) 40 MG tablet TAKE 1 TABLET DAILY 90 tablet 0  . prednisoLONE acetate (PRED FORTE) 1 % ophthalmic suspension Place 1 drop into both eyes 1 day or 1 dose.    Marland Kitchen Propylene Glycol-Glycerin (SOOTHE) 0.6-0.6 % SOLN Apply to eye.    . triamterene-hydrochlorothiazide (MAXZIDE-25) 37.5-25 MG tablet TAKE 1 TABLET DAILY (DOSE CHANGED FROM 75-25. SHOULD BE TAKING 1 WHOLE TABLET 37.5-25 MG) 90 tablet 0  . trimethoprim-polymyxin b (POLYTRIM) ophthalmic solution Place 1 drop into both eyes every 4 (four) hours.  10 mL 1   No current facility-administered medications for this visit.    Allergies as of 06/23/2019 - Review Complete 06/23/2019  Allergen Reaction Noted  . Amoxapine and related  05/13/2018  . Diamox [acetazolamide]  10/15/2018  . Keflex [cephalexin] Rash 04/29/2018  . Tape Rash 02/06/2018    ROS:  General: Negative for anorexia, weight loss, fever, chills, fatigue, weakness. ENT: Negative for hoarseness, difficulty swallowing , nasal congestion. CV: Negative for chest pain, angina, palpitations, dyspnea on exertion, peripheral edema.  Respiratory: Negative for dyspnea at rest, dyspnea on exertion, cough, sputum, wheezing.  GI: See history of present illness. GU:  Negative for dysuria, hematuria, urinary incontinence, urinary frequency, nocturnal urination.  Endo: Negative for unusual weight change.    Physical Examination:   BP 130/80   Pulse 66   Temp 97.9 F (36.6 C) (Oral)   LMP  (LMP Unknown)   General: Well-nourished, well-developed in no acute distress.  Eyes: No icterus. Conjunctivae pink. Mouth: Oropharyngeal mucosa moist and pink , no lesions erythema or exudate. Neck: Supple, Trachea midline Abdomen: Bowel sounds are normal, nontender, nondistended, no hepatosplenomegaly or masses, no abdominal bruits or hernia , no rebound or guarding.   Extremities: No lower extremity edema. No clubbing or deformities. Neuro: Alert and oriented x 3.  Grossly intact. Skin: Warm and dry, no jaundice.   Psych: Alert and cooperative, normal mood and affect.  Labs: CMP     Component Value Date/Time   NA 138 05/14/2019 0945   K 4.3 05/14/2019 0945   CL 101 05/14/2019 0945   CO2 29 05/14/2019 0945   GLUCOSE 95 05/14/2019 0945   BUN 20 05/14/2019 0945   CREATININE 1.11 (H) 05/14/2019 0945   CALCIUM 10.1 05/14/2019 0945   PROT 7.2 05/14/2019 0945   AST 13 05/14/2019 0945   ALT 8 05/14/2019 0945   BILITOT 0.6 05/14/2019 0945   GFRNONAA 47 (L) 05/14/2019 0945   GFRAA 54  (L) 05/14/2019 0945   Lab Results  Component Value Date   WBC 7.0 05/14/2019   HGB 13.6 05/14/2019   HCT 42.3 05/14/2019   MCV 85.6 05/14/2019   PLT 239 05/14/2019    Imaging Studies: No results found.  Assessment and Plan:   Lindsey Jacobs is a 84 y.o. y/o female here for follow-up of dysphagia to water only  Her symptoms are very atypical However, she would like to continue work-up and would like referral to ENT EGD reassuring ENT referral placed  Dr Melodie Bouillon

## 2019-06-24 ENCOUNTER — Ambulatory Visit: Payer: Medicare Other | Admitting: Family Medicine

## 2019-06-25 ENCOUNTER — Other Ambulatory Visit: Payer: Self-pay

## 2019-06-25 ENCOUNTER — Other Ambulatory Visit: Payer: Self-pay | Admitting: Ophthalmology

## 2019-06-25 ENCOUNTER — Telehealth: Payer: Self-pay

## 2019-06-25 ENCOUNTER — Ambulatory Visit (INDEPENDENT_AMBULATORY_CARE_PROVIDER_SITE_OTHER): Payer: Medicare Other | Admitting: Family Medicine

## 2019-06-25 ENCOUNTER — Other Ambulatory Visit: Payer: Self-pay | Admitting: Nurse Practitioner

## 2019-06-25 ENCOUNTER — Encounter: Payer: Self-pay | Admitting: Family Medicine

## 2019-06-25 ENCOUNTER — Ambulatory Visit
Admission: RE | Admit: 2019-06-25 | Discharge: 2019-06-25 | Disposition: A | Discharge status: Home / Self Care | Payer: Medicare Other | Source: Ambulatory Visit | Attending: Ophthalmology | Admitting: Ophthalmology

## 2019-06-25 VITALS — BP 148/78 | HR 53 | Temp 97.1°F | Ht 64.5 in | Wt 315.0 lb

## 2019-06-25 DIAGNOSIS — H209 Unspecified iridocyclitis: Secondary | ICD-10-CM

## 2019-06-25 DIAGNOSIS — H539 Unspecified visual disturbance: Secondary | ICD-10-CM | POA: Diagnosis not present

## 2019-06-25 DIAGNOSIS — M2550 Pain in unspecified joint: Secondary | ICD-10-CM

## 2019-06-25 DIAGNOSIS — I10 Essential (primary) hypertension: Secondary | ICD-10-CM

## 2019-06-25 DIAGNOSIS — H5789 Other specified disorders of eye and adnexa: Secondary | ICD-10-CM | POA: Diagnosis not present

## 2019-06-25 DIAGNOSIS — H538 Other visual disturbances: Secondary | ICD-10-CM | POA: Diagnosis not present

## 2019-06-25 IMAGING — DX DG CHEST 2V
2 series · 2 of 2 positions shown · non-contrast
Comparison: None.

CLINICAL DATA: Mild cough.

EXAM:
CHEST - 2 VIEW

[chest lat]
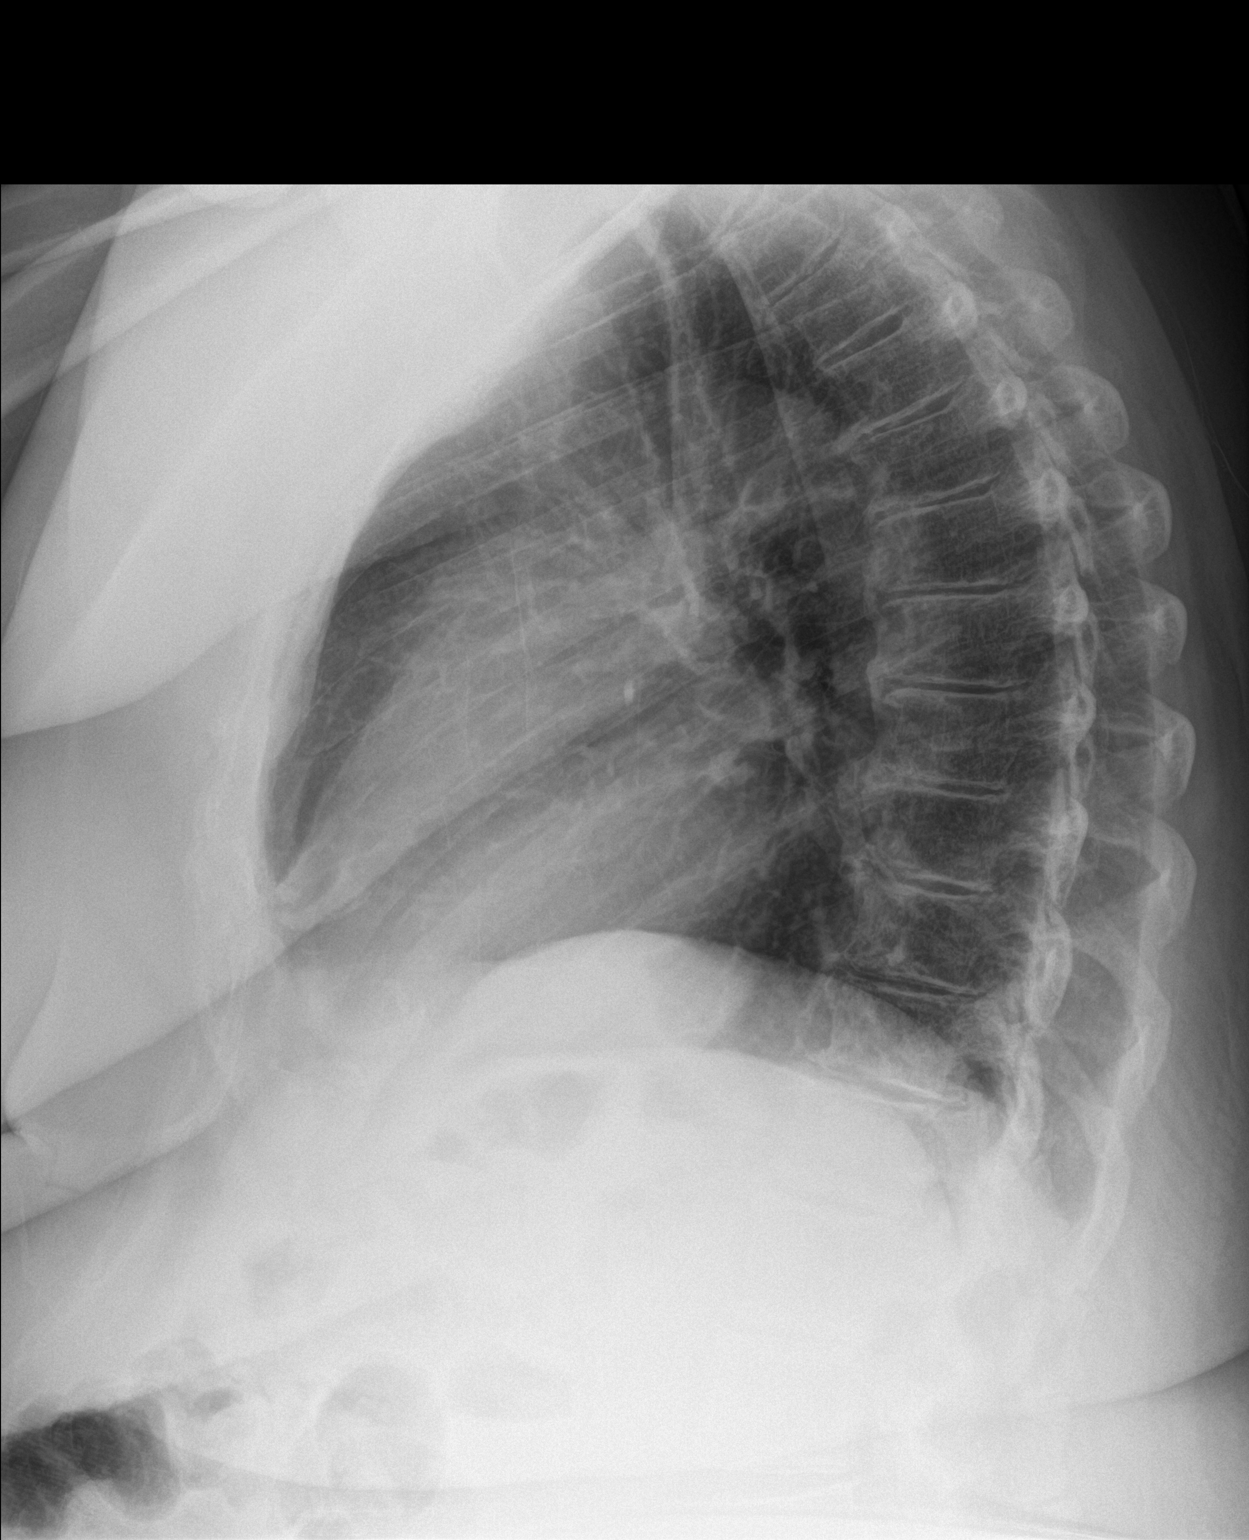

[chest ap]
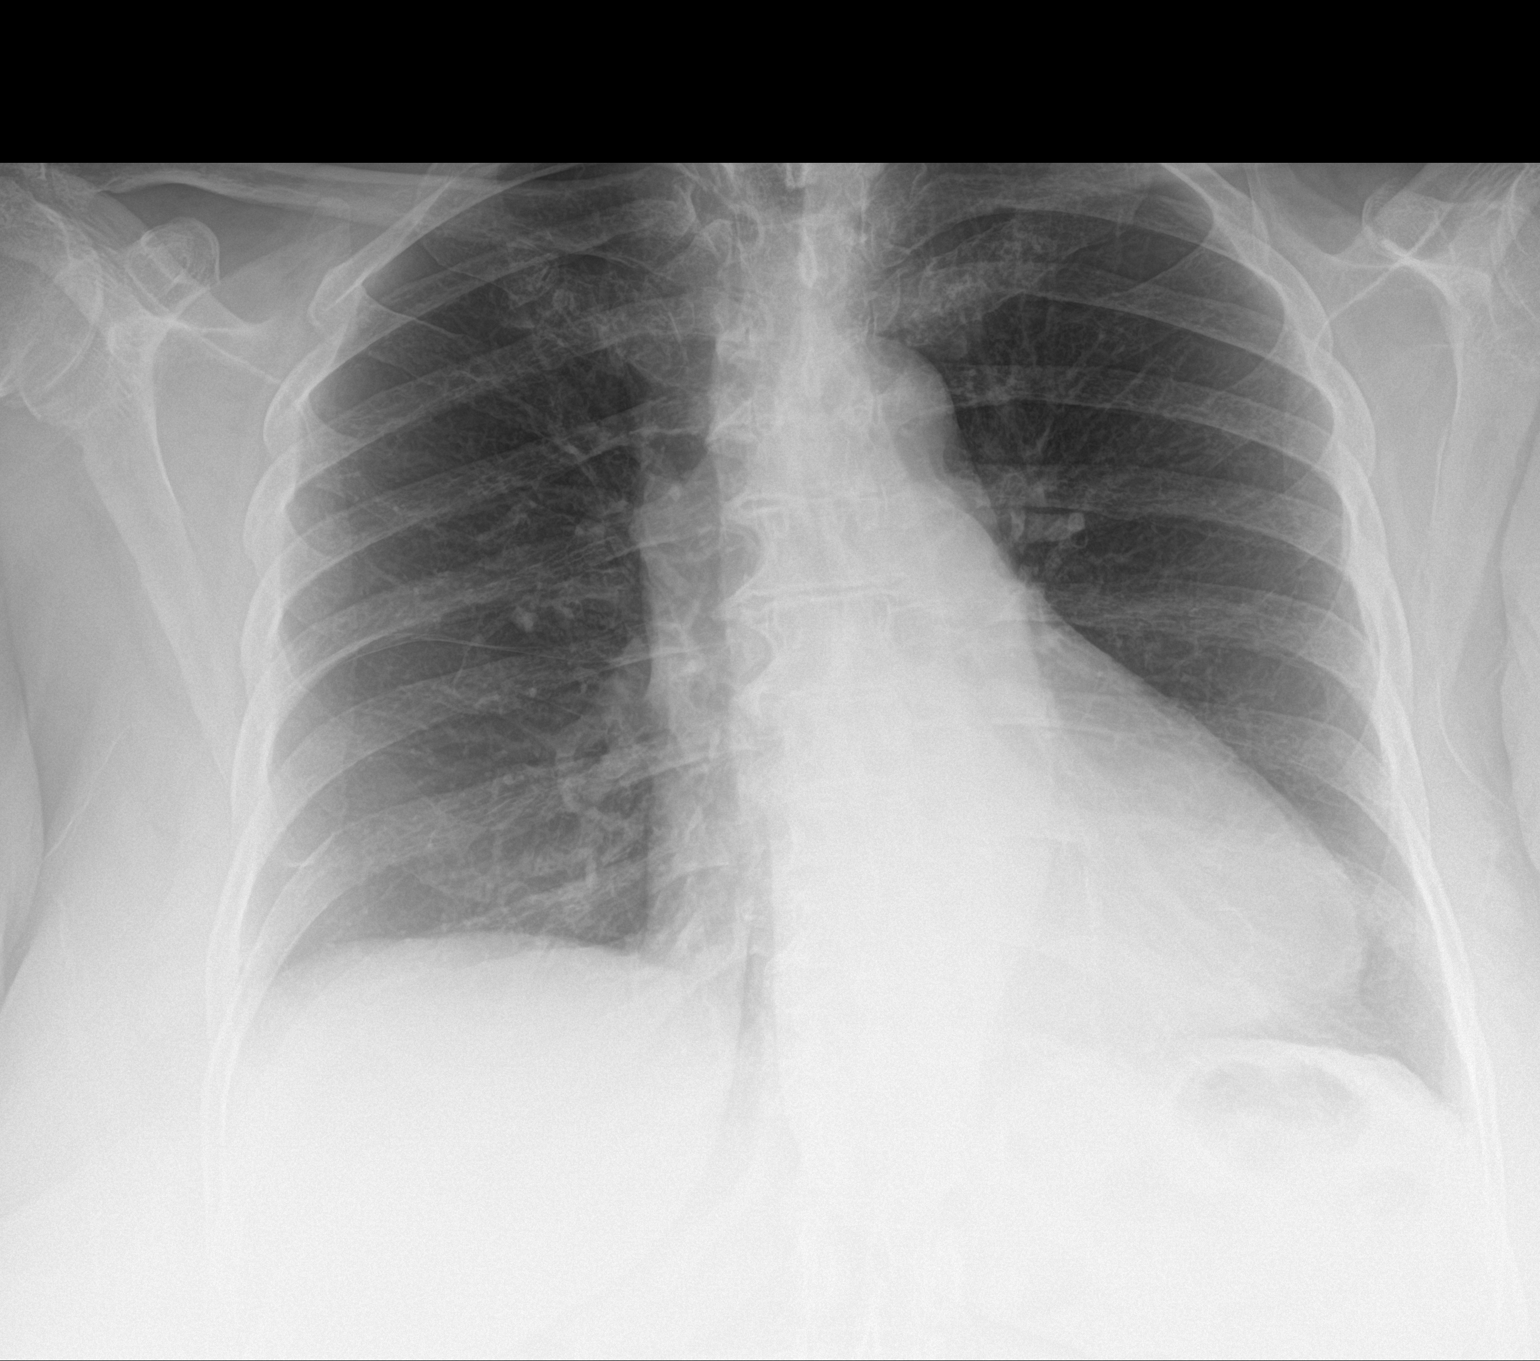

[2 of 2 positions shown; findings below may reference images not displayed]

FINDINGS: The heart size and mediastinal contours are within normal limits.
Both lungs are clear. Degenerative disc disease noted throughout the
thoracic spine.
IMPRESSION: No active cardiopulmonary abnormalities.

## 2019-06-25 NOTE — Telephone Encounter (Signed)
Requested Prescriptions  Pending Prescriptions Disp Refills  . losartan (COZAAR) 50 MG tablet [Pharmacy Med Name: LOSARTAN TABS 50MG ] 90 tablet 3    Sig: TAKE 1 TABLET DAILY     Cardiovascular:  Angiotensin Receptor Blockers Failed - 06/25/2019  3:10 AM      Failed - Cr in normal range and within 180 days    Creat  Date Value Ref Range Status  05/14/2019 1.11 (H) 0.60 - 0.88 mg/dL Final    Comment:    For patients >82 years of age, the reference limit for Creatinine is approximately 13% higher for people identified as African-American. .          Passed - K in normal range and within 180 days    Potassium  Date Value Ref Range Status  05/14/2019 4.3 3.5 - 5.3 mmol/L Final         Passed - Patient is not pregnant      Passed - Last BP in normal range    BP Readings from Last 1 Encounters:  06/23/19 130/80         Passed - Valid encounter within last 6 months    Recent Outpatient Visits          1 month ago Essential hypertension   [facility] [place] [place], [place], FNP   3 months ago Gastroesophageal reflux disease without esophagitis   [facility], [place], DO   4 months ago Acute left-sided low back pain with left-sided sciatica   [facility], DO   8 months ago Essential hypertension   [facility], NP   1 year ago Acute conjunctivitis of both eyes, unspecified acute conjunctivitis type   [facility], DO      Future Appointments            In 1 month Malfi, Smitty Cords, FNP [facility], [facility]

## 2019-06-25 NOTE — Patient Instructions (Signed)
Keep your scheduled follow up appointment with your retina specialist.  We have sent your labs for testing and will contact you once we receive the results.  Can try topical voltaren/diclofenac gel and/or acetaminophen and heating pad as needed for joint pain.  As we discussed, avoid ibuprofen and all NSAIDs (motrin, aleve, advil, naproxen, goodys) as you have decreased kidney function and these medications can make it worse.  Take your medications as directed.  We will plan to see you back as scheduled in August for your follow up visit  You will receive a survey after today's visit either digitally by e-mail or paper by [employer]. Your experiences and feedback matter to [place].  Please respond so we know how we are doing as we provide care for you.  Call us with any questions/concerns/needs.  It is my goal to be available to you for your health concerns.  Thanks for choosing me to be a partner in your healthcare needs!  Charlaine Dalton, FNP-C Family Nurse Practitioner [facility] Phone: [phone]

## 2019-06-25 NOTE — Telephone Encounter (Signed)
Called and spoke with French Ana form [facility] and scheduled a routine pick for the patient labs requested by her Retina Specialist.   Confirmation # [id number]

## 2019-06-25 NOTE — Telephone Encounter (Signed)
[facility] faxed [place] to let us know that is patient is scheduled with Meta Hatchet on 07/09/2019 at 9:15am.

## 2019-06-25 NOTE — Progress Notes (Signed)
Subjective:    Patient ID: Lindsey Jacobs, female    DOB: 1935-12-14, 84 y.o.   MRN: [id number]  Lindsey Jacobs is a 84 y.o. female presenting on 06/25/2019 for Eye Problem (left eye blurinsee, Pt was seen by the Retina specialist who diagnose her with inflammation in the Eye. She was told to f/u with her PCP for chest xray and labwork to rule out the cause for the inflamation )   HPI  [name] presents to clinic for follow up on left eye bluriness.  Reports she had gone to see her eye doctor and had left eye swelling that the provider was unable to see the back of her eye, was referred to a retinal specialist that is requesting specific blood work and a chest xray.  States was told to follow up at our office.  Has had no change in her symptoms since onset.  Daughter is driving her and no other acute concerns today.  Depression screen [facility] 2/9 03/14/2019 02/03/2019 10/15/2018  Decreased Interest 0 0 0  Down, Depressed, Hopeless 0 0 0  PHQ - 2 Score 0 0 0  Altered sleeping 0 0 0  Tired, decreased energy 0 0 0  Change in appetite 0 0 0  Feeling bad or failure about yourself  0 0 0  Trouble concentrating 0 0 0  Moving slowly or fidgety/restless 0 0 0  Suicidal thoughts 0 0 0  PHQ-9 Score 0 0 0  Difficult doing work/chores Not difficult at all Not difficult at all Not difficult at all    Social History   Tobacco Use  . Smoking status: Never Smoker  . Smokeless tobacco: Never Used  Substance Use Topics  . Alcohol use: Never  . Drug use: Never    Review of Systems  Constitutional: Negative.   HENT: Negative.   Eyes: Positive for visual disturbance. Negative for photophobia, pain, discharge, redness and itching.  Respiratory: Negative.   Cardiovascular: Negative.   Gastrointestinal: Negative.   Endocrine: Negative.   Genitourinary: Negative.   Musculoskeletal: Negative.   Skin: Negative.   Allergic/Immunologic: Negative.   Neurological: Negative.   Hematological: Negative.     Psychiatric/Behavioral: Negative.    Per HPI unless specifically indicated above     Objective:    BP (!) 148/78 (BP Location: Left Arm, Patient Position: Sitting, Cuff Size: Large)   Pulse (!) 53   Temp (!) 97.1 F (36.2 C) (Temporal)   Ht 5' 4.5" (1.638 m)   Wt (!) 315 lb (142.9 kg)   LMP  (LMP Unknown)   BMI 53.23 kg/m   Wt Readings from Last 3 Encounters:  06/25/19 (!) 315 lb (142.9 kg)  05/20/19 (!) 318 lb 10.5 oz (144.5 kg)  05/14/19 (!) 317 lb (143.8 kg)    Physical Exam Vitals reviewed.  Constitutional:      General: She is not in acute distress.    Appearance: Normal appearance. She is well-developed and well-groomed. She is obese. She is not ill-appearing or toxic-appearing.  HENT:     Head: Normocephalic.     Nose:     Comments: Lindsey Jacobs is in place, covering mouth and nose  Eyes:     General: Lids are normal. Vision grossly intact.        Right eye: No discharge.        Left eye: No discharge.     Extraocular Movements: Extraocular movements intact.     Conjunctiva/sclera: Conjunctivae normal.     Pupils: Pupils  are equal, round, and reactive to light.  Cardiovascular:     Pulses: Normal pulses.  Pulmonary:     Effort: Pulmonary effort is normal. No respiratory distress.  Musculoskeletal:     Right lower leg: No edema.     Left lower leg: No edema.  Skin:    General: Skin is warm and dry.     Capillary Refill: Capillary refill takes less than 2 seconds.  Neurological:     General: No focal deficit present.     Mental Status: She is alert and oriented to person, place, and time.  Psychiatric:        Attention and Perception: Attention and perception normal.        Mood and Affect: Mood and affect normal.        Speech: Speech normal.        Behavior: Behavior normal. Behavior is cooperative.        Cognition and Memory: Cognition and memory normal.     Results for orders placed or performed during the hospital encounter of 05/20/19  Surgical  pathology  Result Value Ref Range   SURGICAL PATHOLOGY      SURGICAL PATHOLOGY CASE: [id number] PATIENT: [facility] Surgical Pathology Report     Specimen Submitted: A. Duodenum mucosa; cbx B. Stomach mucosa; cbx C. Stomach polyp; cbx D. Esophagus; cbx  Clinical History: Dysphagia.  Gastric polyp, atrophic gastric mucosa      DIAGNOSIS: A. DUODENUM, SCALLOPED MUCOSA; COLD BIOPSY: - PEPTIC DUODENITIS. - NEGATIVE FOR FEATURES OF CELIAC DISEASE. - NEGATIVE FOR DYSPLASIA AND MALIGNANCY.  B. STOMACH MUCOSA; COLD BIOPSY: - MILD NONSPECIFIC CHRONIC GASTRITIS. - NEGATIVE FOR DYSPLASIA AND MALIGNANCY.  C. STOMACH POLYP; COLD BIOPSY: - HYPERPLASTIC POLYP. - NEGATIVE FOR DYSPLASIA AND MALIGNANCY.  D. ESOPHAGUS; COLD BIOPSY: - SQUAMOUS MUCOSA WITH NO SIGNIFICANT PATHOLOGIC ALTERATION. - NEGATIVE FOR INCREASED EOSINOPHILS. - NEGATIVE FOR INTESTINAL METAPLASIA, DYSPLASIA, AND MALIGNANCY.   GROSS DESCRIPTION: A. Labeled: Cold biopsies scalloped duodenal mucosa Received: In formalin Tissue fragment(s): 3 Size: 0 .4 x 0.2 x 0.1 cm Description: Aggregate of tan tissue fragments Entirely submitted in 1 cassette.  B. Labeled: Cold biopsies atrophic gastric mucosa Received: In formalin Tissue fragment(s): 2 Size: 0.7 x 0.2 x 0.1 cm Description: Aggregate of pink-tan tissue fragments Entirely submitted in 1 cassette.  C. Labeled: Cold biopsy gastric polyp Received: In formalin Tissue fragment(s): 1 Size: 0.3 cm Description: Pink-red polypoid fragment Entirely submitted in 1 cassette.  D. Labeled: Esophageal cold biopsies rule out EOE Received: In formalin Tissue fragment(s): 2 Size: 0.6 x 0.3 x 0.1 cm Description: Aggregate of pink-white tissue fragments Entirely submitted in 1 cassette.   Final Diagnosis performed by Georgeanna Harrison, MD.   Electronically signed 05/21/2019 3:20:33PM The electronic signature indicates that the named Attending Pathologist has  evaluated the specimen Technical component performed at [facility], [address], [place], [place] [address] Lab: [phone] Dir: Jolene Schimke, MD, MMM  Professional component performed at [facility], [facility], [address] [place], [place], [place] [address] Lab: [phone] Dir: Georgiann Cocker. Oneita Kras, MD       Assessment & Plan:   Problem List Items Addressed This Visit      Other   Blurred vision    Left eye blurred vision, following with eye specialist and retina specialist.  Requesting additional lab orders.  Labs ordered and patient directed to radiology for cxr ordered by retinal provider.  Plan: 1. Follow up with retina specialist as directed  Other Visit Diagnoses    Arthralgia, unspecified joint    -  Primary   Relevant Orders   Rheumatoid factor   Antinuclear Antib (ANA)   Cyclic citrul peptide antibody, IgG   Eye swelling       Relevant Orders   CBC with Differential   COMPLETE METABOLIC PANEL WITH GFR   RPR   Vision changes       Other specified disorders of eye and adnexa          No orders of the defined types were placed in this encounter.     Follow up plan: Return if symptoms worsen or fail to improve.   Charlaine Dalton, FNP Family Nurse Practitioner [facility] 06/25/2019, 12:11 PM

## 2019-06-25 NOTE — Assessment & Plan Note (Signed)
Left eye blurred vision, following with eye specialist and retina specialist.  Requesting additional lab orders.  Labs ordered and patient directed to radiology for cxr ordered by retinal provider.  Plan: 1. Follow up with retina specialist as directed

## 2019-06-28 LAB — CBC WITH DIFFERENTIAL/PLATELET
Absolute Monocytes: 480 cells/uL (ref 200–950)
Basophils Absolute: 48 cells/uL (ref 0–200)
Basophils Relative: 0.6 %
Eosinophils Absolute: 520 cells/uL — ABNORMAL HIGH (ref 15–500)
Eosinophils Relative: 6.5 %
HCT: 42.4 % (ref 35.0–45.0)
Hemoglobin: 14 g/dL (ref 11.7–15.5)
Lymphs Abs: 1592 cells/uL (ref 850–3900)
MCH: 28.2 pg (ref 27.0–33.0)
MCHC: 33 g/dL (ref 32.0–36.0)
MCV: 85.5 fL (ref 80.0–100.0)
MPV: 10 fL (ref 7.5–12.5)
Monocytes Relative: 6 %
Neutro Abs: 5360 cells/uL (ref 1500–7800)
Neutrophils Relative %: 67 %
Platelets: 244 10*3/uL (ref 140–400)
RBC: 4.96 10*6/uL (ref 3.80–5.10)
RDW: 14.8 % (ref 11.0–15.0)
Total Lymphocyte: 19.9 %
WBC: 8 10*3/uL (ref 3.8–10.8)

## 2019-06-28 LAB — COMPLETE METABOLIC PANEL WITH GFR
AG Ratio: 1.3 (calc) (ref 1.0–2.5)
ALT: 8 U/L (ref 6–29)
AST: 11 U/L (ref 10–35)
Albumin: 4.1 g/dL (ref 3.6–5.1)
Alkaline phosphatase (APISO): 88 U/L (ref 37–153)
BUN/Creatinine Ratio: 16 (calc) (ref 6–22)
BUN: 16 mg/dL (ref 7–25)
CO2: 28 mmol/L (ref 20–32)
Calcium: 10.2 mg/dL (ref 8.6–10.4)
Chloride: 100 mmol/L (ref 98–110)
Creat: 0.97 mg/dL — ABNORMAL HIGH (ref 0.60–0.88)
GFR, Est African American: 63 mL/min/{1.73_m2} (ref >=60)
GFR, Est Non African American: 54 mL/min/{1.73_m2} — ABNORMAL LOW (ref >=60)
Globulin: 3.1 g/dL (calc) (ref 1.9–3.7)
Glucose, Bld: 99 mg/dL (ref 65–99)
Potassium: 4 mmol/L (ref 3.5–5.3)
Sodium: 138 mmol/L (ref 135–146)
Total Bilirubin: 0.7 mg/dL (ref 0.2–1.2)
Total Protein: 7.2 g/dL (ref 6.1–8.1)

## 2019-06-28 LAB — RHEUMATOID FACTOR: Rheumatoid fact SerPl-aCnc: <14 IU/mL (ref <14)

## 2019-06-28 LAB — ANA: Anti Nuclear Antibody (ANA): NEGATIVE

## 2019-06-28 LAB — RPR: RPR Ser Ql: NONREACTIVE

## 2019-06-28 LAB — CYCLIC CITRUL PEPTIDE ANTIBODY, IGG: Cyclic Citrullin Peptide Ab: 22 UNITS — ABNORMAL HIGH

## 2019-06-30 ENCOUNTER — Other Ambulatory Visit: Payer: Self-pay | Admitting: Family Medicine

## 2019-06-30 ENCOUNTER — Telehealth: Payer: Self-pay

## 2019-06-30 DIAGNOSIS — R768 Other specified abnormal immunological findings in serum: Secondary | ICD-10-CM | POA: Insufficient documentation

## 2019-06-30 NOTE — Telephone Encounter (Signed)
Copied from CRM [id number]. Topic: General - Inquiry >> Jun 30, 2019  9:16 AM Reggie Pile, NT wrote: Reason for CRM: Patient called in to discuss lab results of test last week. Please advise as no note stating [facility] can discuss.  Attempted to contact the patient back, no answer.

## 2019-07-03 NOTE — Telephone Encounter (Signed)
Error

## 2019-08-05 ENCOUNTER — Other Ambulatory Visit: Payer: Self-pay | Admitting: Nurse Practitioner

## 2019-08-05 DIAGNOSIS — K219 Gastro-esophageal reflux disease without esophagitis: Secondary | ICD-10-CM

## 2019-08-05 NOTE — Telephone Encounter (Signed)
Requested Prescriptions  Pending Prescriptions Disp Refills  . pantoprazole (PROTONIX) 40 MG tablet [Pharmacy Med Name: PANTOPRAZOLE SODIUM DR TABS 40MG ] 90 tablet 3    Sig: TAKE 1 TABLET DAILY     Gastroenterology: Proton Pump Inhibitors Passed - 08/05/2019  1:50 PM      Passed - Valid encounter within last 12 months    Recent Outpatient Visits          1 month ago Arthralgia, unspecified joint   [facility], FNP   2 months ago Essential hypertension   [facility], [facility], FNP   4 months ago Gastroesophageal reflux disease without esophagitis   [facility], [place], DO   6 months ago Acute left-sided low back pain with left-sided sciatica   [facility], DO   9 months ago Essential hypertension   [facility], [place], NP      Future Appointments            In 1 week Malfi, Alison Stalling, FNP [facility], [facility]

## 2019-08-06 ENCOUNTER — Other Ambulatory Visit: Payer: Self-pay | Admitting: Family Medicine

## 2019-08-06 DIAGNOSIS — Z1231 Encounter for screening mammogram for malignant neoplasm of breast: Secondary | ICD-10-CM

## 2019-08-09 ENCOUNTER — Other Ambulatory Visit: Payer: Self-pay | Admitting: Family Medicine

## 2019-08-09 DIAGNOSIS — I10 Essential (primary) hypertension: Secondary | ICD-10-CM

## 2019-08-11 DIAGNOSIS — M7581 Other shoulder lesions, right shoulder: Secondary | ICD-10-CM | POA: Insufficient documentation

## 2019-08-11 DIAGNOSIS — M75121 Complete rotator cuff tear or rupture of right shoulder, not specified as traumatic: Secondary | ICD-10-CM | POA: Insufficient documentation

## 2019-08-11 DIAGNOSIS — M47812 Spondylosis without myelopathy or radiculopathy, cervical region: Secondary | ICD-10-CM | POA: Insufficient documentation

## 2019-08-18 ENCOUNTER — Other Ambulatory Visit: Payer: Self-pay

## 2019-08-18 ENCOUNTER — Encounter: Payer: Self-pay | Admitting: Family Medicine

## 2019-08-18 ENCOUNTER — Ambulatory Visit (INDEPENDENT_AMBULATORY_CARE_PROVIDER_SITE_OTHER): Payer: Medicare Other | Admitting: Family Medicine

## 2019-08-18 VITALS — BP 127/66 | HR 56 | Temp 97.8°F | Ht 64.5 in | Wt 313.0 lb

## 2019-08-18 DIAGNOSIS — I10 Essential (primary) hypertension: Secondary | ICD-10-CM | POA: Diagnosis not present

## 2019-08-18 DIAGNOSIS — M25511 Pain in right shoulder: Secondary | ICD-10-CM

## 2019-08-18 DIAGNOSIS — G8929 Other chronic pain: Secondary | ICD-10-CM | POA: Insufficient documentation

## 2019-08-18 DIAGNOSIS — R42 Dizziness and giddiness: Secondary | ICD-10-CM | POA: Diagnosis not present

## 2019-08-18 DIAGNOSIS — R7303 Prediabetes: Secondary | ICD-10-CM | POA: Diagnosis not present

## 2019-08-18 DIAGNOSIS — N6320 Unspecified lump in the left breast, unspecified quadrant: Secondary | ICD-10-CM | POA: Insufficient documentation

## 2019-08-18 DIAGNOSIS — H538 Other visual disturbances: Secondary | ICD-10-CM

## 2019-08-18 LAB — POCT GLYCOSYLATED HEMOGLOBIN (HGB A1C): Hemoglobin A1C: 6 % — AB (ref 4.0–5.6)

## 2019-08-18 MED ORDER — MECLIZINE HCL 12.5 MG PO TABS: 12.5000 mg | ORAL_TABLET | Freq: Three times a day (TID) | ORAL | 1 refills | Status: DC | PRN

## 2019-08-18 NOTE — Patient Instructions (Signed)
As we discussed, your A1C increased from 5.8% on 05/14/2019 to 6.0%, likely related to the steroid injection you received in your right shoulder from Orthopedics.  We will continue to watch this in the next 3-6 months to ensure does not progress to diabetes.  Continue your medications as prescribed.  Try to get exercise a minimum of 30 minutes per day at least 5 days per week as well as  adequate water intake all while measuring blood pressure a few times per week.  Keep a blood pressure log and bring back to clinic at your next visit.  If your readings are consistently over 130/80 to contact our office/send me a MyChart message and we will see you sooner.  Can try DASH and Mediterranean diet options, avoiding processed foods, lowering sodium intake, avoiding pork products, and eating a plant based diet for optimal health.  Keep your upcoming appointment for mammogram on 09/15/2019.  If you notice any breast changes progressing from today's visit, please let me know and I will contact them to try and move your appointment up.  We will plan to see you back in 3 months for hypertension and prediabetes follow up visit  You will receive a survey after today's visit either digitally by e-mail or paper by [employer] mail. Your experiences and feedback matter to [place].  Please respond so we know how we are doing as we provide care for you.  Call us with any questions/concerns/needs.  It is my goal to be available to you for your health concerns.  Thanks for choosing me to be a partner in your healthcare needs!  Charlaine Dalton, FNP-C Family Nurse Practitioner [facility] Phone: [phone]

## 2019-08-18 NOTE — Assessment & Plan Note (Signed)
Reports vertigo when rolling over in bed, having the room "spin".  Has previously taken meclizine in the past, patient is unsure if she had taken 12.5 or 25mg .  Discussed will begin on 12.5mg  and then re-evaluate her symptoms.  Episodes lasting a few moments until she can focus on something that is not moving.    Plan: 1. Begin meclizine 12.5mg  TID PRN for vertigo 2. If symptoms persist, will refer to physical therapy  3. RTC PRN

## 2019-08-18 NOTE — Progress Notes (Signed)
Subjective:    Patient ID: Lindsey Jacobs, female    DOB: May 09, 1935, 84 y.o.   MRN: [id number]  Lindsey Jacobs is a 84 y.o. female presenting on 08/18/2019 for Hypertension   HPI   Lindsey Jacobs presents to clinic for re-evaluation on her hypertension, prediabetes, vertigo and left breast concerns.  Reports has been having an increase in vertigo, only when rolling over in bed at night.  Lasts for a few moments but is then broken when she looks at a stationary object.  Denies nausea or vomiting with her symptoms.  Reports has a history of vertigo in the past that she managed well with meclizine.  Requesting medication refill.  Has concern for left breast lump.  Reports over the last few months she has noticed a change in her left breast, that she is able to feel when she is laying down.  Denies any skin changes, puckering, dimpling, drainage from nipple.  Reports last mammogram was in 2019, had not completed one in 2020 due to COVID.  Hypertension - She is checking BP at home or outside of clinic.    - Current medications: losartan 25mg  daily and triamterene-HCTZ 37.5-25mg  daily, tolerating well without side effects - She is not currently symptomatic. - Pt denies headache, lightheadedness, dizziness, changes in vision, chest tightness/pressure, palpitations, leg swelling, sudden loss of speech or loss of consciousness. - She  reports no regular exercise routine. - Her diet is high in salt, high in fat, and high in carbohydrates.  [facility] 03/14/2019 02/03/2019 10/15/2018  Decreased Interest 0 0 0  Down, Depressed, Hopeless 0 0 0  PHQ - 2 Score 0 0 0  Altered sleeping 0 0 0  Tired, decreased energy 0 0 0  Change in appetite 0 0 0  Feeling bad or failure about yourself  0 0 0  Trouble concentrating 0 0 0  Moving slowly or fidgety/restless 0 0 0  Suicidal thoughts 0 0 0  PHQ-9 Score 0 0 0  Difficult doing work/chores Not difficult at all Not difficult at all Not difficult at  all    Social History   Tobacco Use  . Smoking status: Never Smoker  . Smokeless tobacco: Never Used  Vaping Use  . Vaping Use: Never used  Substance Use Topics  . Alcohol use: Never  . Drug use: Never    Review of Systems  Constitutional: Negative.   HENT: Negative.   Eyes: Negative.   Respiratory: Negative.   Cardiovascular: Negative.   Gastrointestinal: Negative.   Endocrine: Negative.   Genitourinary: Negative.   Musculoskeletal: Positive for arthralgias. Negative for back pain, gait problem, joint swelling, myalgias, neck pain and neck stiffness.  Skin: Negative.        Left breast lump  Allergic/Immunologic: Negative.   Neurological: Positive for dizziness. Negative for tremors, seizures, syncope, facial asymmetry, speech difficulty, weakness, light-headedness, numbness and headaches.  Hematological: Negative.   Psychiatric/Behavioral: Negative.    Per HPI unless specifically indicated above     Objective:    BP 127/66 (BP Location: Left Arm, Patient Position: Sitting, Cuff Size: Large)   Pulse 56   Temp 97.8 F (36.6 C) (Oral)   Ht 5' 4.5" (1.638 m)   Wt (!) 313 lb (142 kg)   LMP  (LMP Unknown)   SpO2 100%   BMI 52.90 kg/m   Wt Readings from Last 3 Encounters:  08/18/19 (!) 313 lb (142 kg)  06/25/19 (!) 315 lb (142.9 kg)  05/20/19 Marland Kitchen(!)  318 lb 10.5 oz (144.5 kg)    Physical Exam Vitals reviewed. Exam conducted with a chaperone present (Patient's daughter).  Constitutional:      General: She is not in acute distress.    Appearance: Normal appearance. She is well-developed and well-groomed. She is morbidly obese. She is not ill-appearing or toxic-appearing.  HENT:     Head: Normocephalic and atraumatic.     Nose:     Comments: Lesia Sago is in place, covering mouth and nose. Eyes:     General: Lids are normal. Vision grossly intact.        Right eye: No discharge.        Left eye: No discharge.     Extraocular Movements:     Right eye: Nystagmus  present.     Left eye: Nystagmus present.     Conjunctiva/sclera: Conjunctivae normal.     Pupils: Pupils are equal, round, and reactive to light.  Cardiovascular:     Rate and Rhythm: Normal rate and regular rhythm.     Pulses: Normal pulses.          Dorsalis pedis pulses are 2+ on the right side and 2+ on the left side.     Heart sounds: Normal heart sounds. No murmur heard.  No friction rub. No gallop.   Pulmonary:     Effort: Pulmonary effort is normal. No respiratory distress.     Breath sounds: Normal breath sounds.  Chest:     Breasts:        Right: Normal.        Left: Mass present. No swelling, bleeding, inverted nipple, nipple discharge, skin change or tenderness.       Comments: Lump/area of thickened breast tissue.  Unable to palpate borders, feels dense compared to right breast tissue. Musculoskeletal:     Right lower leg: No edema.     Left lower leg: No edema.  Lymphadenopathy:     Upper Body:     Right upper body: No supraclavicular or axillary adenopathy.     Left upper body: No supraclavicular or axillary adenopathy.  Skin:    General: Skin is warm and dry.     Capillary Refill: Capillary refill takes less than 2 seconds.  Neurological:     General: No focal deficit present.     Mental Status: She is alert and oriented to person, place, and time.     Comments: Uses wheelchair for ambulation  Psychiatric:        Attention and Perception: Attention and perception normal.        Mood and Affect: Mood and affect normal.        Speech: Speech normal.        Behavior: Behavior normal. Behavior is cooperative.        Thought Content: Thought content normal.        Cognition and Memory: Cognition and memory normal.        Judgment: Judgment normal.    Results for orders placed or performed in visit on 06/25/19  Rheumatoid factor  Result Value Ref Range   Rhuematoid fact SerPl-aCnc <14 <14 IU/mL  Antinuclear Antib (ANA)  Result Value Ref Range   Anti  Nuclear Antibody (ANA) NEGATIVE NEGATIVE  Cyclic citrul peptide antibody, IgG  Result Value Ref Range   Cyclic Citrullin Peptide Ab 22 (H) UNITS  CBC with Differential  Result Value Ref Range   WBC 8.0 3.8 - 10.8 Thousand/uL   RBC 4.96 3.80 - 5.10  Million/uL   Hemoglobin 14.0 11.7 - 15.5 g/dL   HCT 64.3 35 - 45 %   MCV 85.5 80.0 - 100.0 fL   MCH 28.2 27.0 - 33.0 pg   MCHC 33.0 32.0 - 36.0 g/dL   RDW 32.9 51.8 - 84.1 %   Platelets 244 140 - 400 Thousand/uL   MPV 10.0 7.5 - 12.5 fL   Neutro Abs 5,360 1,500 - 7,800 cells/uL   Lymphs Abs 1,592 850 - 3,900 cells/uL   Absolute Monocytes 480 200 - 950 cells/uL   Eosinophils Absolute 520 (H) 15 - 500 cells/uL   Basophils Absolute 48 0 - 200 cells/uL   Neutrophils Relative % 67 %   Total Lymphocyte 19.9 %   Monocytes Relative 6.0 %   Eosinophils Relative 6.5 %   Basophils Relative 0.6 %  COMPLETE METABOLIC PANEL WITH GFR  Result Value Ref Range   Glucose, Bld 99 65 - 99 mg/dL   BUN 16 7 - 25 mg/dL   Creat 6.60 (H) 6.30 - 0.88 mg/dL   GFR, Est Non African American 54 (L) > OR = 60 mL/min/1.61m2   GFR, Est African American 63 > OR = 60 mL/min/1.36m2   BUN/Creatinine Ratio 16 6 - 22 (calc)   Sodium 138 135 - 146 mmol/L   Potassium 4.0 3.5 - 5.3 mmol/L   Chloride 100 98 - 110 mmol/L   CO2 28 20 - 32 mmol/L   Calcium 10.2 8.6 - 10.4 mg/dL   Total Protein 7.2 6.1 - 8.1 g/dL   Albumin 4.1 3.6 - 5.1 g/dL   Globulin 3.1 1.9 - 3.7 g/dL (calc)   AG Ratio 1.3 1.0 - 2.5 (calc)   Total Bilirubin 0.7 0.2 - 1.2 mg/dL   Alkaline phosphatase (APISO) 88 37 - 153 U/L   AST 11 10 - 35 U/L   ALT 8 6 - 29 U/L  RPR  Result Value Ref Range   RPR Ser Ql NON-REACTIVE NON-REACTI      Assessment & Plan:   Problem List Items Addressed This Visit      Cardiovascular and Mediastinum   Essential hypertension    Controlled hypertension.  BP at goal < 130/80.  Pt is working on lifestyle modifications.  Taking medications tolerating well without side  effects.   Complications: morbid obesity, OSA, GERD, prediabetes  Plan: 1. Continue taking losartan 25mg  daily, triamterene-HCTZ 37.5-12.5mg  2. Obtain labs in 3 months  3. Encouraged heart healthy diet and increasing exercise to 30 minutes most days of the week, going no more than 2 days in a row without exercise. 4. Check BP 1-2 x per week at home, keep log, and bring to clinic at next appointment. 5. Follow up 3 months.           Other   Prediabetes - Primary    A1C 6.0%, increased from 5.8% at last visit on 05/14/2019.  Has recently had a right shoulder steroid injection with orthopedics, which can be cause for this increase, discussed this with Lindsey Jacobs.  Encouraged dietary modifications and that we will continue to recheck her A1C every 3 months.  Plan: 1. Continue working on dietary and lifestyle modifications 2. RTC in 3 months for re-evaluation      Relevant Orders   POCT glycosylated hemoglobin (Hb A1C)   Blurred vision    Has followed with retinal specialist and reports her visual changes have resolved.  Reports retinal specialist had her on multiple eye drops and her vision in  her right eye has returned to 20/20 and left eye to 20/40.  Reports prior to starting eye drops her left eye vision was reported at 20/400.  No concerns today and continues to follow with retina specialist.      Right shoulder pain    Follows with Dr. Michiel Cowboy at [facility] for chronic right shoulder pain.  Recently was given a right shoulder steroid injection and reports that the provider believes she may benefit from a right shoulder replacement.  Discussed with patient that the steroid injections may be increasing her glucose levels, which could be the reason her A1C went from 5.8% at last visit to 6.0% today.  Reports follow up with [facility] for right shoulder pain in 6 weeks.  No acute concerns today.      Vertigo    Reports vertigo when rolling over in bed, having the room  "spin".  Has previously taken meclizine in the past, patient is unsure if she had taken 12.5 or 25mg .  Discussed will begin on 12.5mg  and then re-evaluate her symptoms.  Episodes lasting a few moments until she can focus on something that is not moving.    Plan: 1. Begin meclizine 12.5mg  TID PRN for vertigo 2. If symptoms persist, will refer to physical therapy  3. RTC PRN      Relevant Medications   meclizine (ANTIVERT) 12.5 MG tablet   Left breast lump    Patient reports left breast lump that she has noticed when laying flat in the bed for a few months.  Denies changes in size, skin dimpling/changes, discharge from nipple.  Has family history of breast cancer and typically has a mammogram once per year.  Did not have her mammogram in 2020 due to COVID.  Plan: 1. Complete mammogram (scheduled 09/15/2019) 2. To contact clinic if having any changes/worsening of lump size/skin changes prior to 09/15/2019 mammogram 3. RTC in 3 months         Meds ordered this encounter  Medications  . meclizine (ANTIVERT) 12.5 MG tablet    Sig: Take 1 tablet (12.5 mg total) by mouth 3 (three) times daily as needed for dizziness.    Dispense:  90 tablet    Refill:  1      Follow up plan: Return in about 3 months (around 11/18/2019) for HTN, prediabetes f/u.   13/02/2019, FNP Family Nurse Practitioner [facility] 08/18/2019, 12:52 PM

## 2019-08-18 NOTE — Assessment & Plan Note (Signed)
Patient reports left breast lump that she has noticed when laying flat in the bed for a few months.  Denies changes in size, skin dimpling/changes, discharge from nipple.  Has family history of breast cancer and typically has a mammogram once per year.  Did not have her mammogram in 2020 due to COVID.  Plan: 1. Complete mammogram (scheduled 09/15/2019) 2. To contact clinic if having any changes/worsening of lump size/skin changes prior to 09/15/2019 mammogram 3. RTC in 3 months

## 2019-08-18 NOTE — Assessment & Plan Note (Signed)
Follows with Dr. Michiel Cowboy at [facility] for chronic right shoulder pain.  Recently was given a right shoulder steroid injection and reports that the provider believes she may benefit from a right shoulder replacement.  Discussed with patient that the steroid injections may be increasing her glucose levels, which could be the reason her A1C went from 5.8% at last visit to 6.0% today.  Reports follow up with [facility] for right shoulder pain in 6 weeks.  No acute concerns today.

## 2019-08-18 NOTE — Assessment & Plan Note (Signed)
Has followed with retinal specialist and reports her visual changes have resolved.  Reports retinal specialist had her on multiple eye drops and her vision in her right eye has returned to 20/20 and left eye to 20/40.  Reports prior to starting eye drops her left eye vision was reported at 20/400.  No concerns today and continues to follow with retina specialist.

## 2019-08-18 NOTE — Assessment & Plan Note (Signed)
A1C 6.0%, increased from 5.8% at last visit on 05/14/2019.  Has recently had a right shoulder steroid injection with orthopedics, which can be cause for this increase, discussed this with Ms. Shillingford.  Encouraged dietary modifications and that we will continue to recheck her A1C every 3 months.  Plan: 1. Continue working on dietary and lifestyle modifications 2. RTC in 3 months for re-evaluation

## 2019-08-18 NOTE — Assessment & Plan Note (Signed)
Controlled hypertension.  BP at goal < 130/80.  Pt is working on lifestyle modifications.  Taking medications tolerating well without side effects.   Complications: morbid obesity, OSA, GERD, prediabetes  Plan: 1. Continue taking losartan 25mg  daily, triamterene-HCTZ 37.5-12.5mg  2. Obtain labs in 3 months  3. Encouraged heart healthy diet and increasing exercise to 30 minutes most days of the week, going no more than 2 days in a row without exercise. 4. Check BP 1-2 x per week at home, keep log, and bring to clinic at next appointment. 5. Follow up 3 months.

## 2019-08-24 ENCOUNTER — Other Ambulatory Visit: Payer: Self-pay | Admitting: Nurse Practitioner

## 2019-08-24 DIAGNOSIS — F324 Major depressive disorder, single episode, in partial remission: Secondary | ICD-10-CM

## 2019-09-15 ENCOUNTER — Other Ambulatory Visit: Payer: Self-pay

## 2019-09-15 ENCOUNTER — Other Ambulatory Visit: Payer: Self-pay | Admitting: Family Medicine

## 2019-09-15 ENCOUNTER — Telehealth: Payer: Self-pay

## 2019-09-15 ENCOUNTER — Ambulatory Visit
Admission: RE | Admit: 2019-09-15 | Discharge: 2019-09-15 | Disposition: A | Discharge status: Home / Self Care | Payer: Medicare Other | Source: Ambulatory Visit | Attending: Family Medicine | Admitting: Family Medicine

## 2019-09-15 DIAGNOSIS — N6459 Other signs and symptoms in breast: Secondary | ICD-10-CM

## 2019-09-15 DIAGNOSIS — Z1231 Encounter for screening mammogram for malignant neoplasm of breast: Secondary | ICD-10-CM | POA: Insufficient documentation

## 2019-09-15 NOTE — Telephone Encounter (Signed)
Copied from CRM [id number]. Topic: Referral - Request for Referral >> Sep 15, 2019 10:45 AM Crist Infante wrote: [facility] calling to request a diagnostic mammogram for the pt.  Pt has thickening in the left breast and rash in that area. Fax  [phone]

## 2019-09-16 DIAGNOSIS — R059 Cough, unspecified: Secondary | ICD-10-CM | POA: Insufficient documentation

## 2019-10-03 ENCOUNTER — Other Ambulatory Visit: Payer: Self-pay | Admitting: Family Medicine

## 2019-10-03 DIAGNOSIS — N6459 Other signs and symptoms in breast: Secondary | ICD-10-CM

## 2019-10-16 ENCOUNTER — Other Ambulatory Visit: Payer: Medicare Other

## 2019-10-17 ENCOUNTER — Ambulatory Visit
Admission: RE | Admit: 2019-10-17 | Discharge: 2019-10-17 | Disposition: A | Discharge status: Home / Self Care | Payer: Medicare Other | Source: Ambulatory Visit | Attending: Family Medicine | Admitting: Family Medicine

## 2019-10-17 ENCOUNTER — Other Ambulatory Visit: Payer: Self-pay

## 2019-10-17 DIAGNOSIS — N6459 Other signs and symptoms in breast: Secondary | ICD-10-CM

## 2019-10-17 IMAGING — MG DIGITAL DIAGNOSTIC BILAT W/ TOMO W/ CAD
6 of 10 series · 6 of 30 positions shown · non-contrast
Comparison: Previous exams.

ACR Breast Density Category a: The breast tissue is almost entirely
fatty.

CLINICAL DATA: 84-year-old female with an area of thickening
involving the left breast.

EXAM:
DIGITAL DIAGNOSTIC BILATERAL MAMMOGRAM WITH TOMO AND CAD; ULTRASOUND
LEFT BREAST LIMITED

[L CC synth-2D]
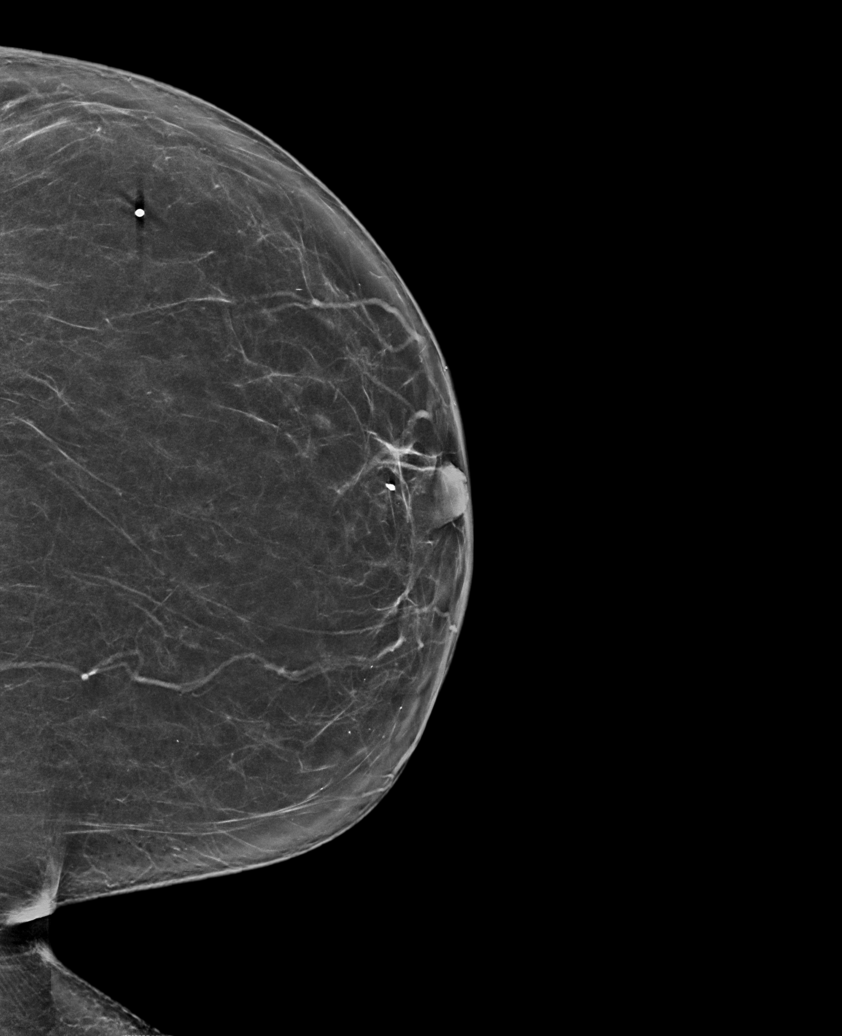

[L MLO synth-2D]
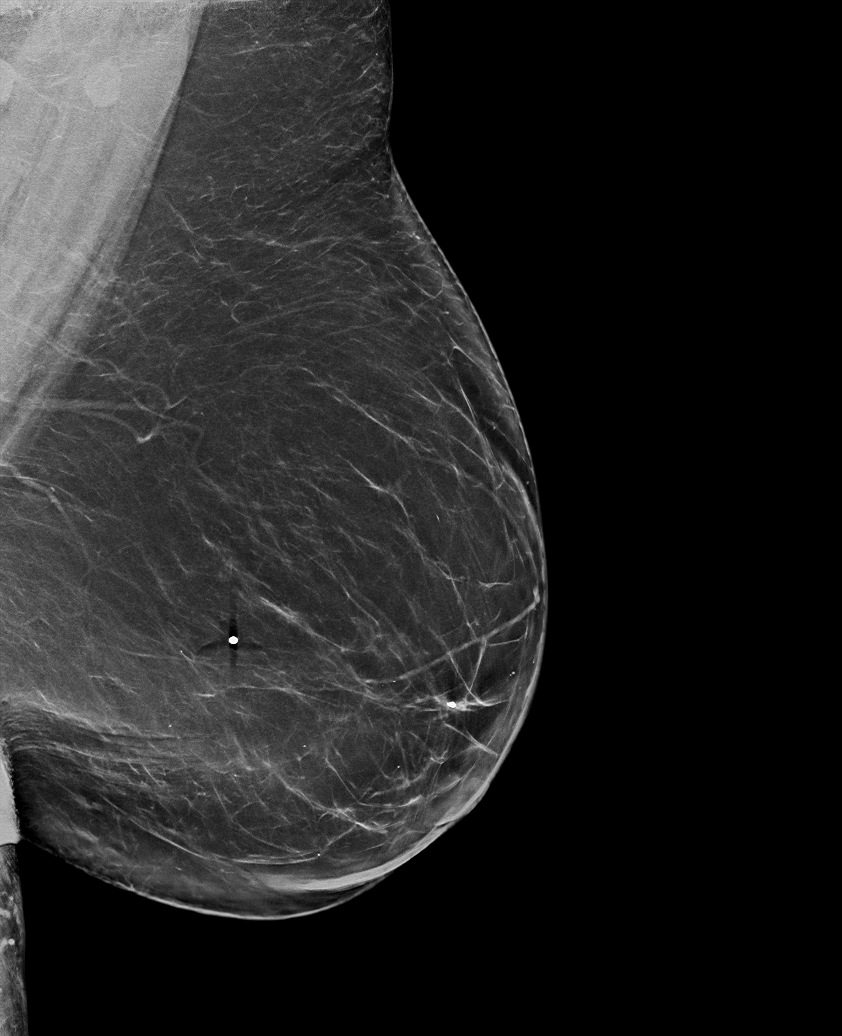

[L XCCL synth-2D]
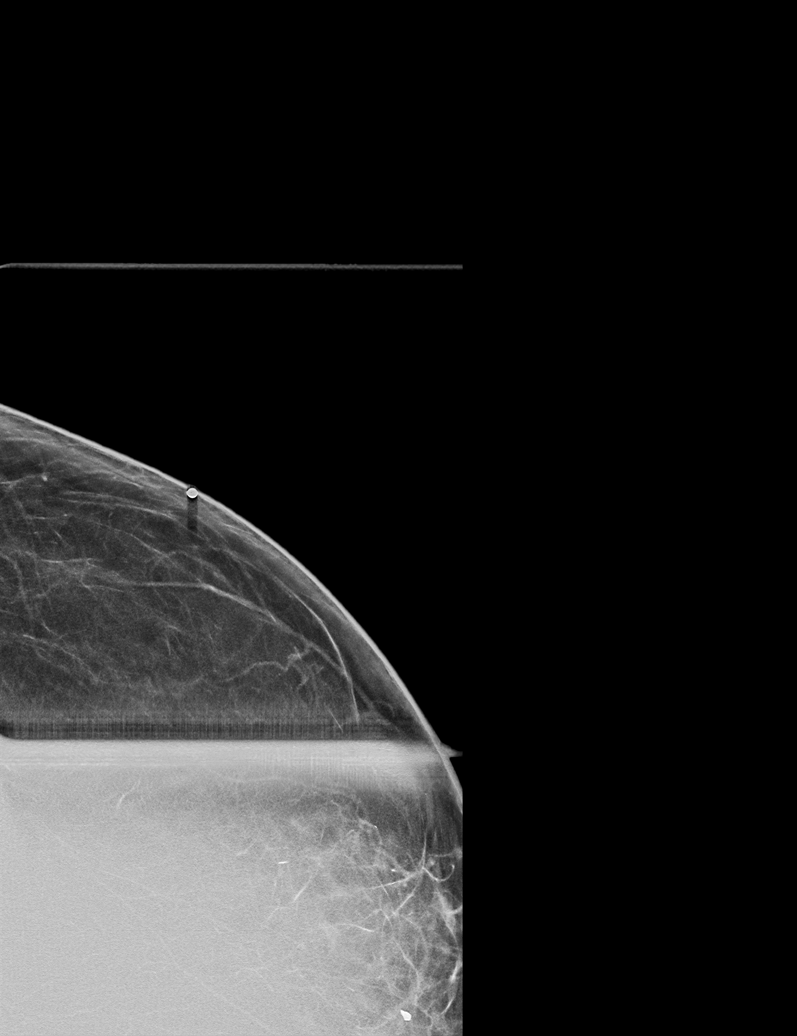

[R MLO synth-2D]
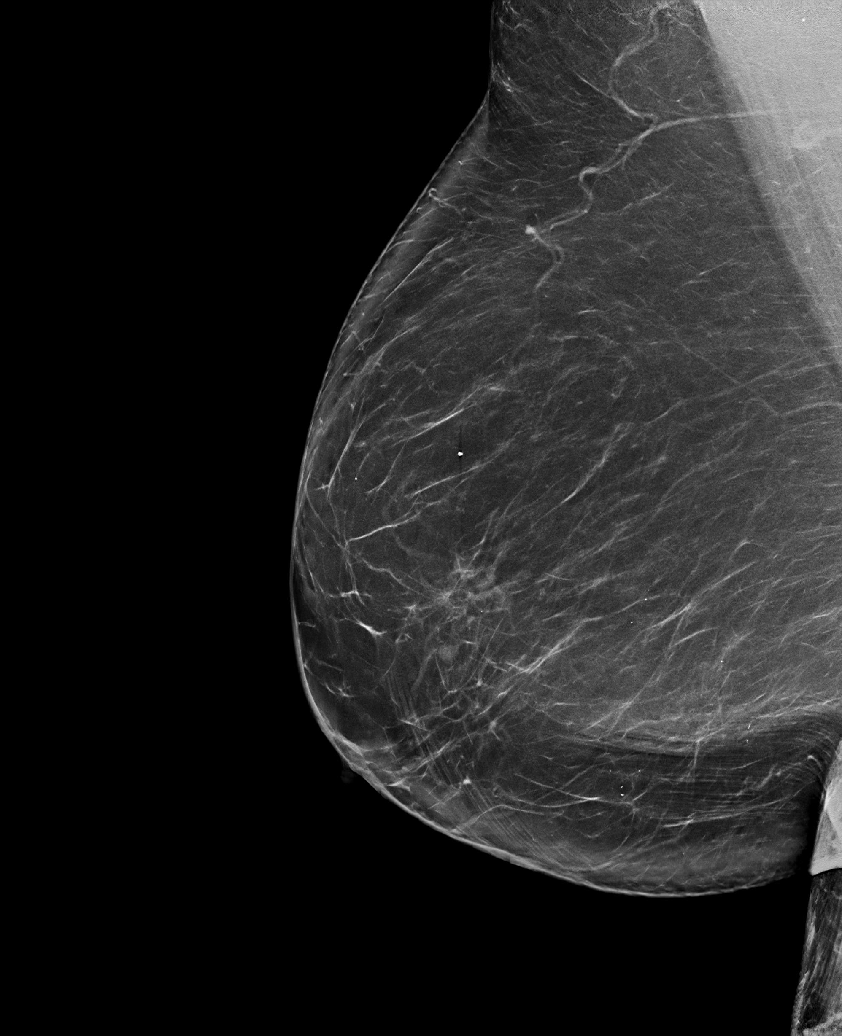

[R CC synth-2D]
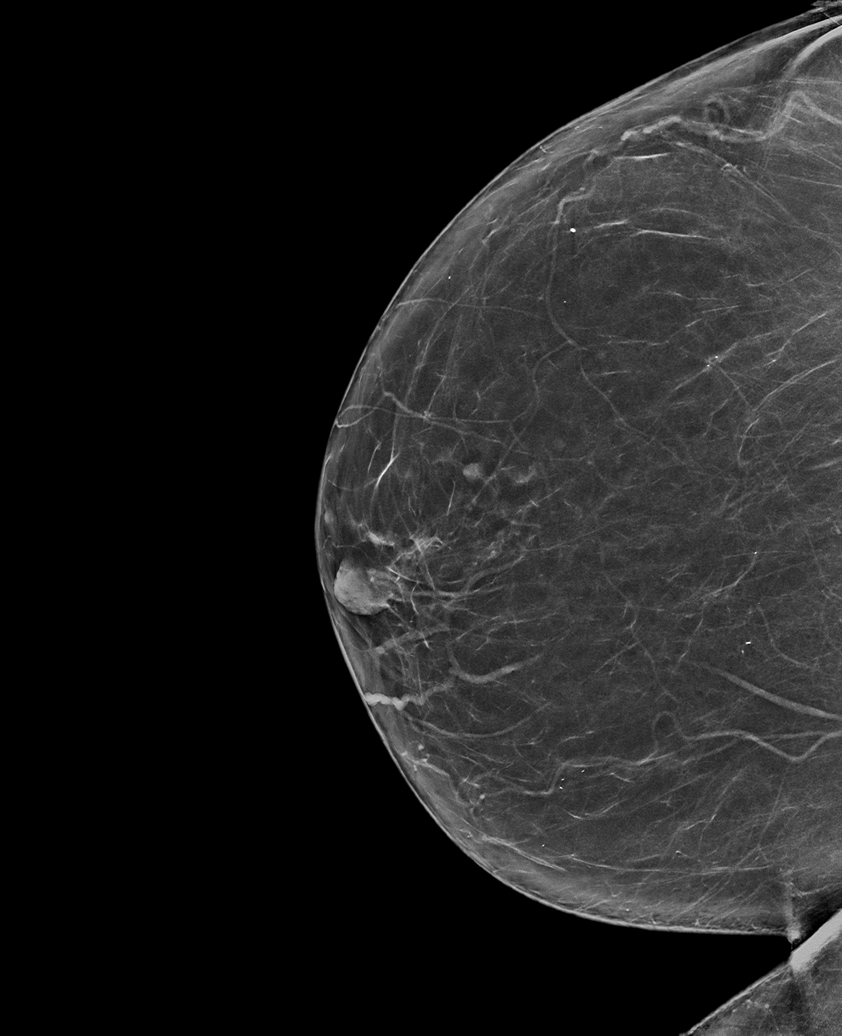

[L MLO tomo · tomo slice 45/88.0]
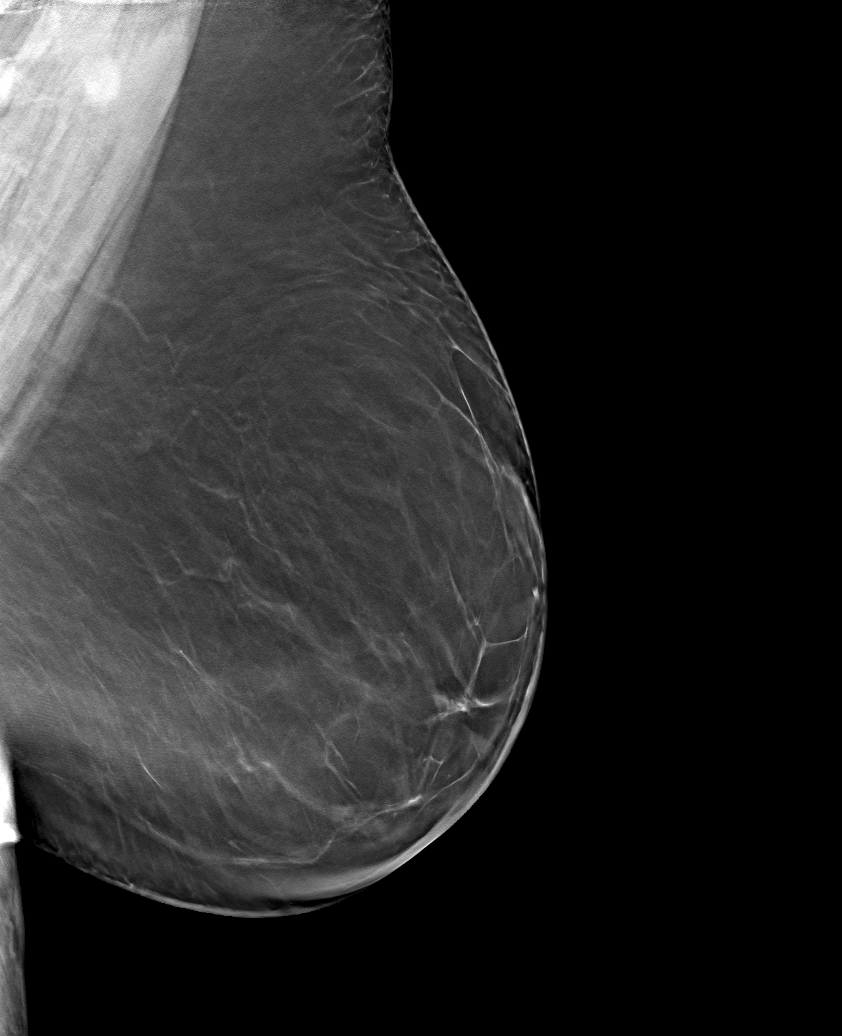

[6 of 30 positions shown; findings below may reference images not displayed]

FINDINGS: No suspicious masses or calcifications are seen in either breast.
Spot compression cc tomograms were performed over the palpable area
of concern in the left breast with no definite abnormality be seen.

Mammographic images were processed with CAD.

Physical examination in the region of palpable concern does not
reveal any palpable masses. The patient states the palpable area of
concern is an area of thickening/firmness involving the entire upper
outer quadrant of the left breast.

Targeted ultrasound of the left breast was performed. No suspicious
masses or abnormality seen, only normal-appearing fibrofatty tissue
identified.
IMPRESSION: 1. No mammographic or sonographic abnormalities at the site of
palpable concern in the left breast.

2.  No mammographic evidence of malignancy in either breast.

RECOMMENDATION:
1. Recommend further management of the left breast palpable area of
concern be based on clinical assessment.

2.  Screening mammogram in one year.(Code:[C6])

I have discussed the findings and recommendations with the patient.
If applicable, a reminder letter will be sent to the patient
regarding the next appointment.

BI-RADS CATEGORY  1: Negative.

## 2019-10-17 IMAGING — US US BREAST*L* LIMITED INC AXILLA
1 series · 5 of 5 positions shown · non-contrast
Comparison: Previous exams.

ACR Breast Density Category a: The breast tissue is almost entirely
fatty.

CLINICAL DATA: 84-year-old female with an area of thickening
involving the left breast.

EXAM:
DIGITAL DIAGNOSTIC BILATERAL MAMMOGRAM WITH TOMO AND CAD; ULTRASOUND
LEFT BREAST LIMITED

[Series 1: us breast*left* limited inc axilla · 0.09mm/px · 5 of 5 slices shown]
[im 1/5]
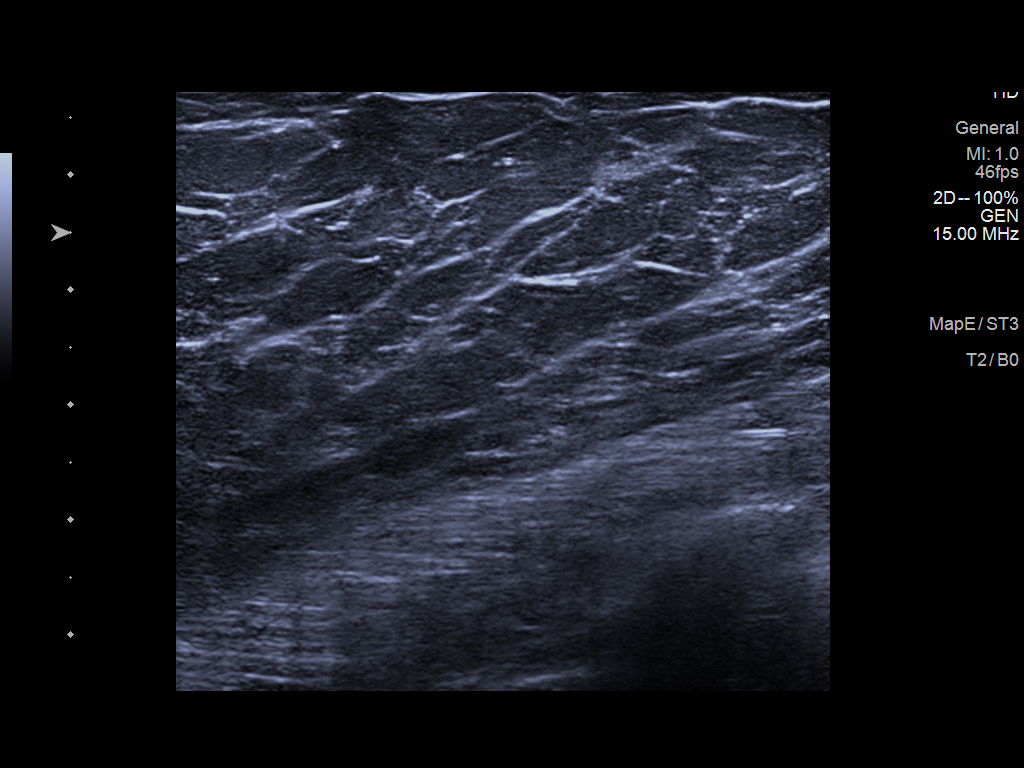
[im 2/5]
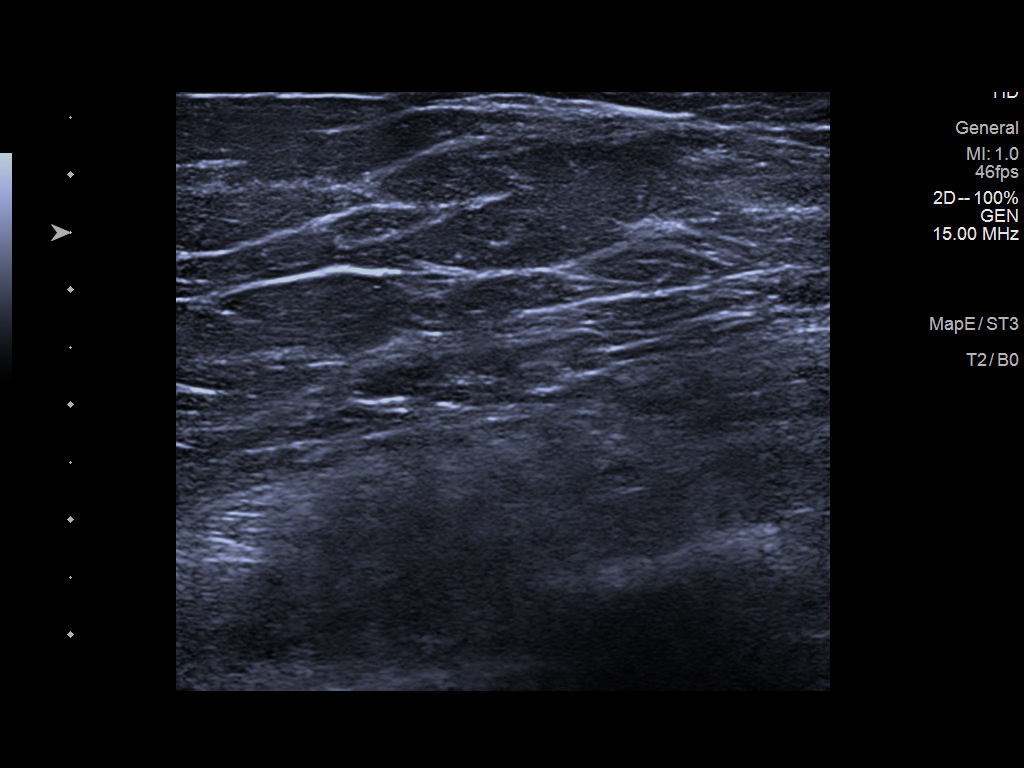
[im 3/5]
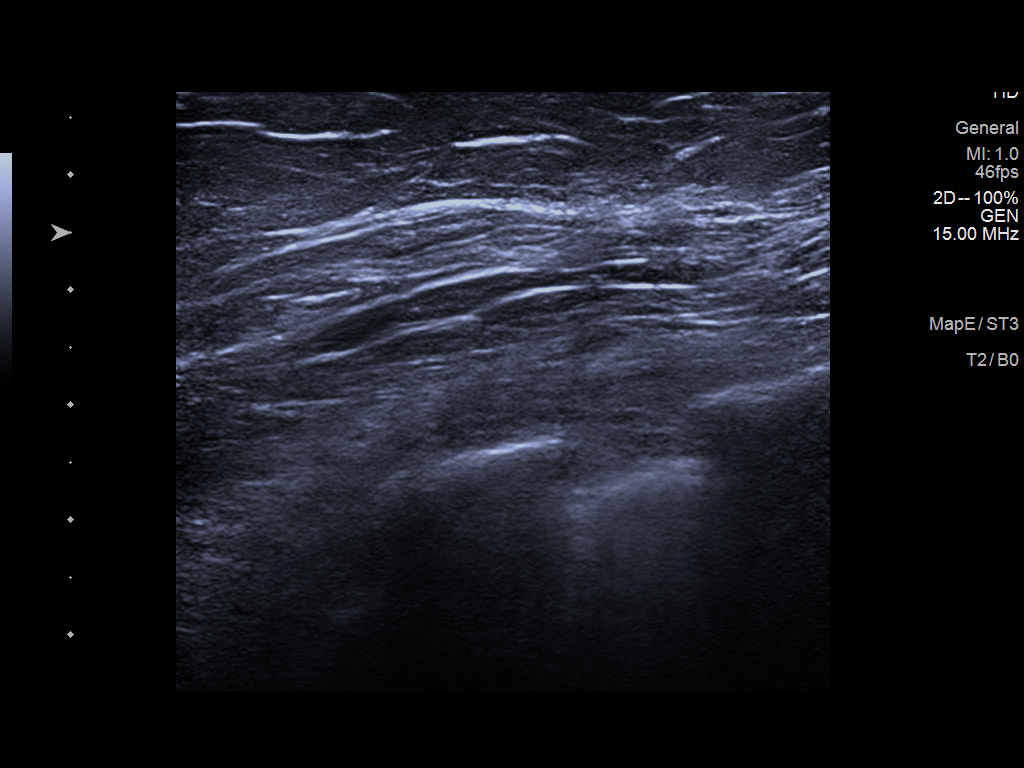
[im 4/5]
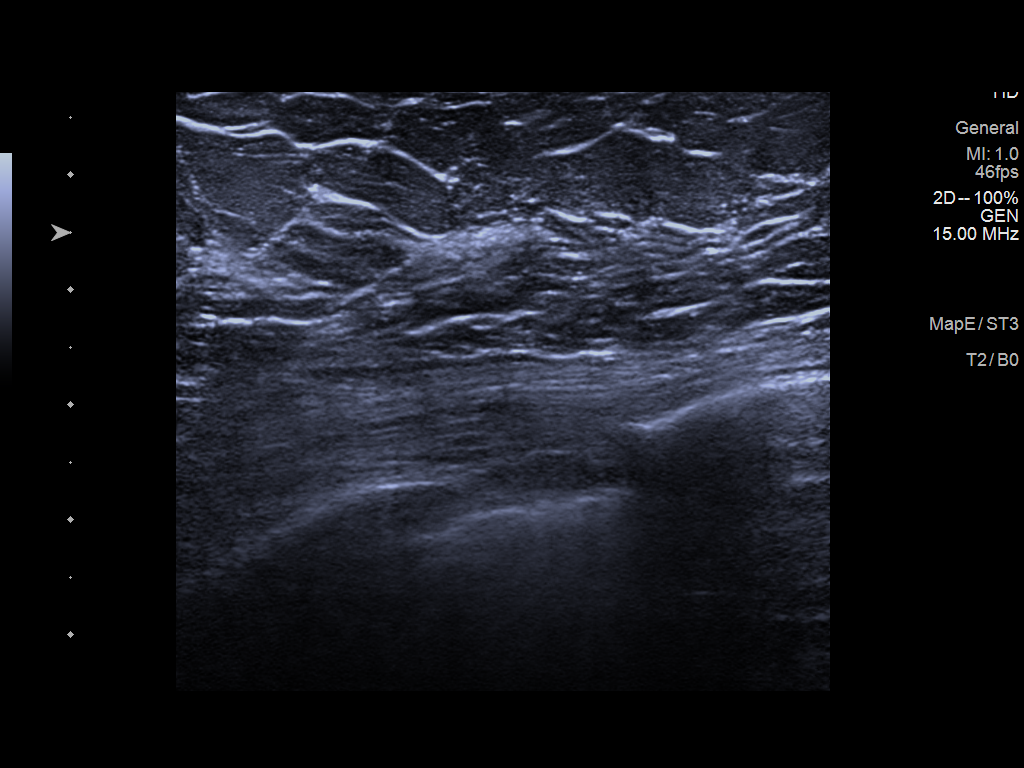
[im 5/5]
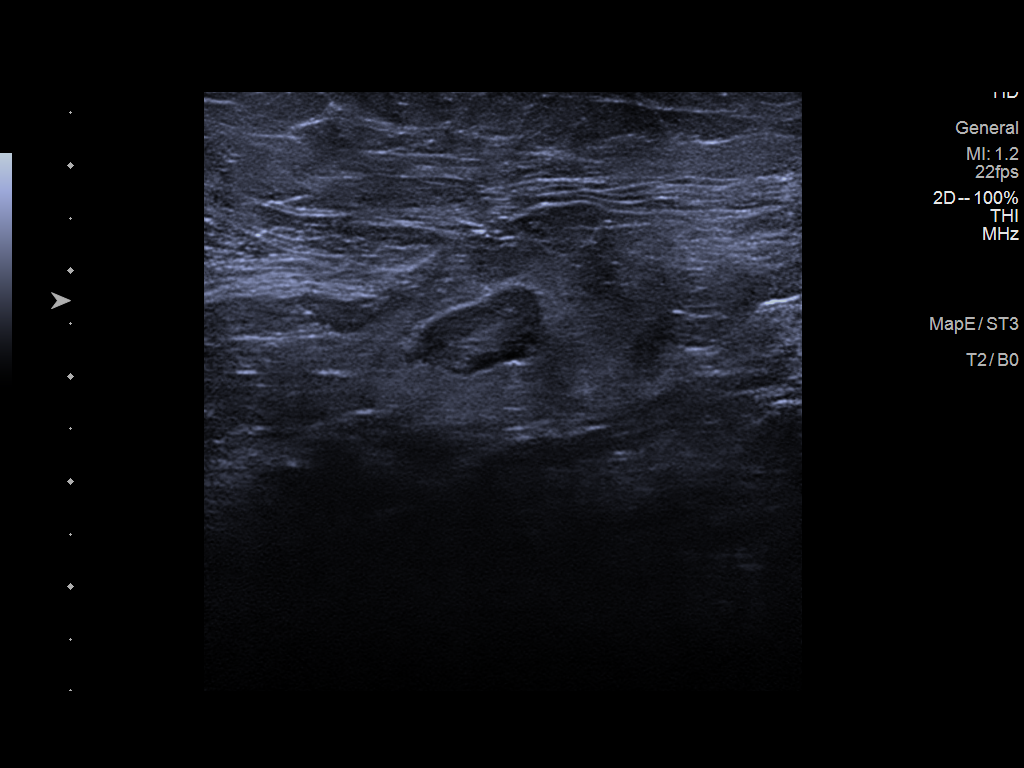

[5 of 5 positions shown; findings below may reference images not displayed]

FINDINGS: No suspicious masses or calcifications are seen in either breast.
Spot compression cc tomograms were performed over the palpable area
of concern in the left breast with no definite abnormality be seen.

Mammographic images were processed with CAD.

Physical examination in the region of palpable concern does not
reveal any palpable masses. The patient states the palpable area of
concern is an area of thickening/firmness involving the entire upper
outer quadrant of the left breast.

Targeted ultrasound of the left breast was performed. No suspicious
masses or abnormality seen, only normal-appearing fibrofatty tissue
identified.
IMPRESSION: 1. No mammographic or sonographic abnormalities at the site of
palpable concern in the left breast.

2.  No mammographic evidence of malignancy in either breast.

RECOMMENDATION:
1. Recommend further management of the left breast palpable area of
concern be based on clinical assessment.

2.  Screening mammogram in one year.(Code:[C6])

I have discussed the findings and recommendations with the patient.
If applicable, a reminder letter will be sent to the patient
regarding the next appointment.

BI-RADS CATEGORY  1: Negative.

## 2019-10-21 ENCOUNTER — Other Ambulatory Visit: Payer: Self-pay | Admitting: Neurology

## 2019-10-21 DIAGNOSIS — R1310 Dysphagia, unspecified: Secondary | ICD-10-CM

## 2019-11-04 ENCOUNTER — Other Ambulatory Visit: Payer: Self-pay | Admitting: Family Medicine

## 2019-11-04 DIAGNOSIS — I10 Essential (primary) hypertension: Secondary | ICD-10-CM

## 2019-11-04 NOTE — Telephone Encounter (Signed)
Requested Prescriptions  Pending Prescriptions Disp Refills   triamterene-hydrochlorothiazide (MAXZIDE-25) 37.5-25 MG tablet [Pharmacy Med Name: TRIAMTERENE/HYDROCHLOROTHIAZIDE TAB 37.5/25MG ] 90 tablet 0    Sig: TAKE 1 TABLET DAILY (DOSE CHANGED FROM 75-25. NEED OFFICE VISIT)     Cardiovascular: Diuretic Combos Failed - 11/04/2019  3:19 AM      Failed - Cr in normal range and within 360 days    Creat  Date Value Ref Range Status  06/25/2019 0.97 (H) 0.60 - 0.88 mg/dL Final    Comment:    For patients >38 years of age, the reference limit for Creatinine is approximately 13% higher for people identified as African-American. .          Passed - K in normal range and within 360 days    Potassium  Date Value Ref Range Status  06/25/2019 4.0 3.5 - 5.3 mmol/L Final         Passed - Na in normal range and within 360 days    Sodium  Date Value Ref Range Status  06/25/2019 138 135 - 146 mmol/L Final         Passed - Ca in normal range and within 360 days    Calcium  Date Value Ref Range Status  06/25/2019 10.2 8.6 - 10.4 mg/dL Final         Passed - Last BP in normal range    BP Readings from Last 1 Encounters:  08/18/19 127/66         Passed - Valid encounter within last 6 months    Recent Outpatient Visits          2 months ago Prediabetes   [facility], Jodelle Gross, FNP   4 months ago Arthralgia, unspecified joint   [facility], Jodelle Gross, FNP   5 months ago Essential hypertension   [facility], Jodelle Gross, FNP   7 months ago Gastroesophageal reflux disease without esophagitis   [facility], Netta Neat, DO   9 months ago Acute left-sided low back pain with left-sided sciatica   [facility] Smitty Cords, DO      Future Appointments            In 1 week [place], Jodelle Gross, FNP [facility], [facility]

## 2019-11-06 ENCOUNTER — Other Ambulatory Visit: Payer: Self-pay

## 2019-11-06 ENCOUNTER — Ambulatory Visit
Admission: RE | Admit: 2019-11-06 | Discharge: 2019-11-06 | Disposition: A | Discharge status: Home / Self Care | Payer: Medicare Other | Source: Ambulatory Visit | Attending: Neurology | Admitting: Neurology

## 2019-11-06 DIAGNOSIS — R1310 Dysphagia, unspecified: Secondary | ICD-10-CM

## 2019-11-06 IMAGING — MR MR HEAD W/O CM
12 series · 48 of 48 positions shown · non-contrast
Comparison: None.

CLINICAL DATA: Coughing and trouble swallowing.

EXAM:
MRI HEAD WITHOUT CONTRAST
TECHNIQUE: Multiplanar, multiecho pulse sequences of the brain and surrounding
structures were obtained without intravenous contrast.

[Series 5: T1 · sagittal · 5.0mm · 0.62mm/px · 3 of 24 slices shown (1 of 2)]
[im 1/24]
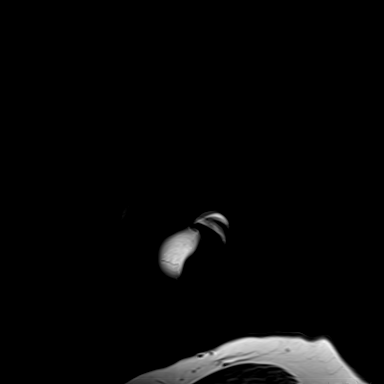
[im 12/24]
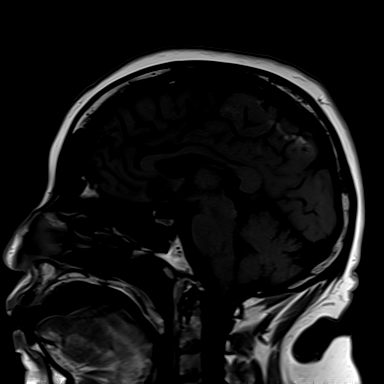
[im 24/24]
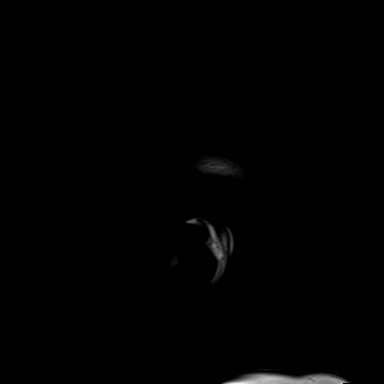

[Series 6: ax dwi_tracew · axial · 3.0mm · 0.60mm/px · z∈[-133,+21]mm · 3 of 48 slices shown]
[im 1/48]
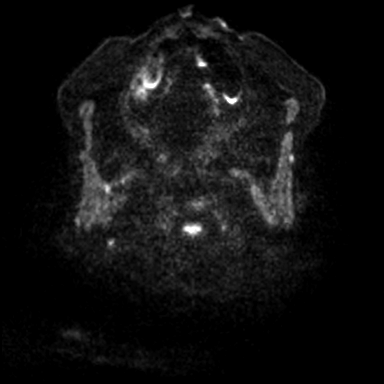
[im 24/48]
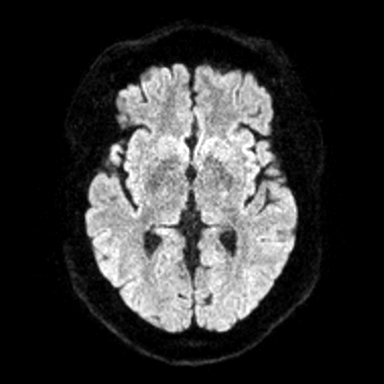
[im 48/48]
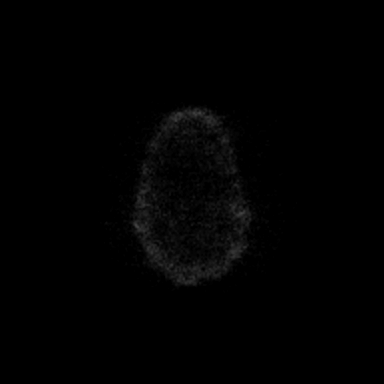

[Series 7: ax dwi_adc · axial · 3.0mm · 0.60mm/px · z∈[-133,+21]mm · 3 of 48 slices shown]
[im 1/48]
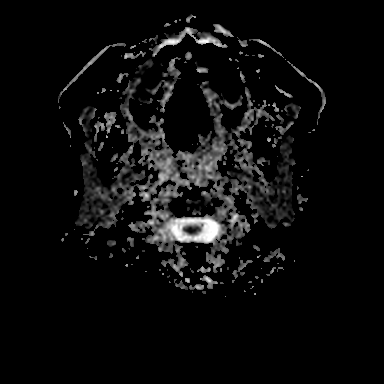
[im 24/48]
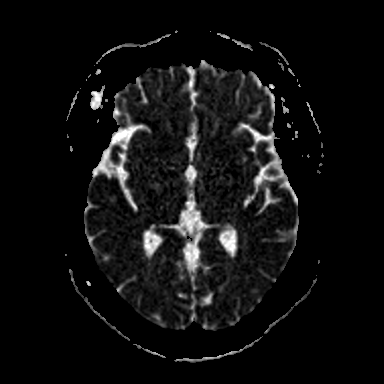
[im 48/48]
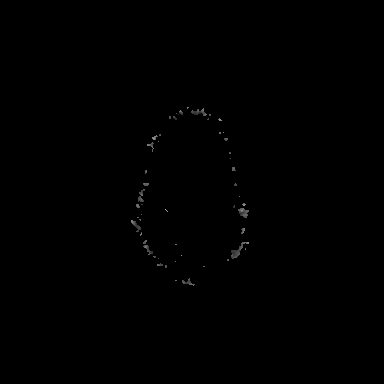

[Series 8: cor dwi_tracew · coronal · 5.0mm · 0.60mm/px · 3 of 38 slices shown]
[im 1/38]
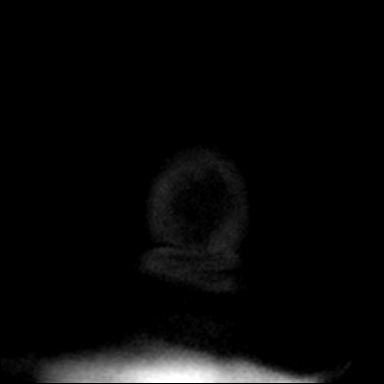
[im 19/38]
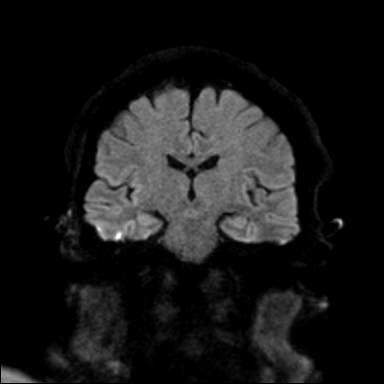
[im 38/38]
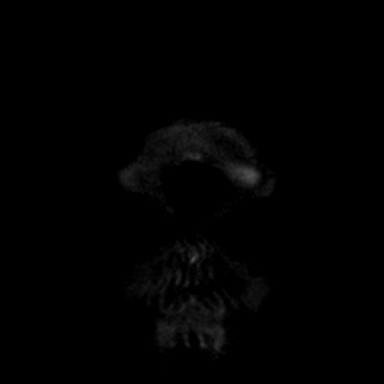

[Series 9: cor dwi_adc · coronal · 5.0mm · 0.60mm/px · 3 of 38 slices shown]
[im 1/38]
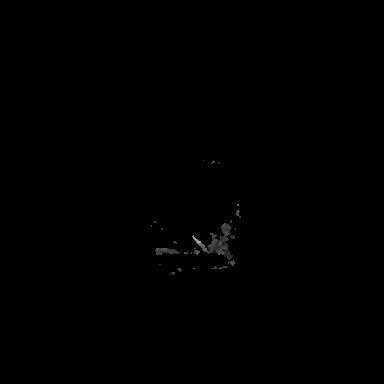
[im 19/38]
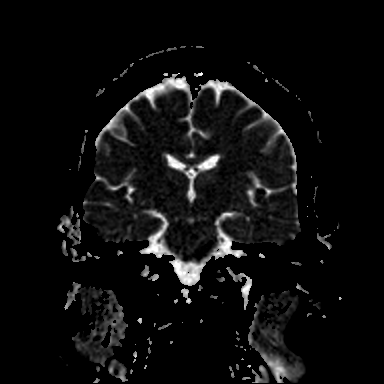
[im 38/38]
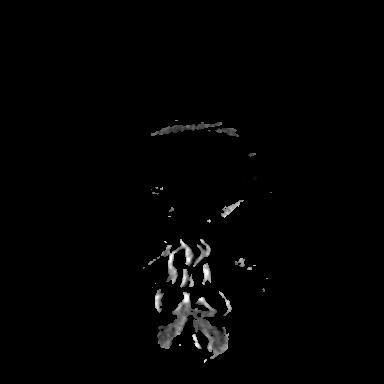

[Series 10: T2 · axial · 5.0mm · 0.53mm/px · z∈[-133,+21]mm · 2 of 27 slices shown (1 of 2)]
[im 1/27]
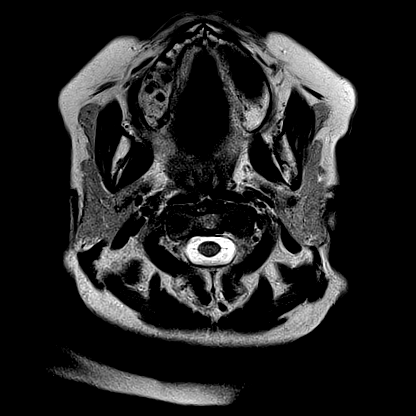
[im 27/27]
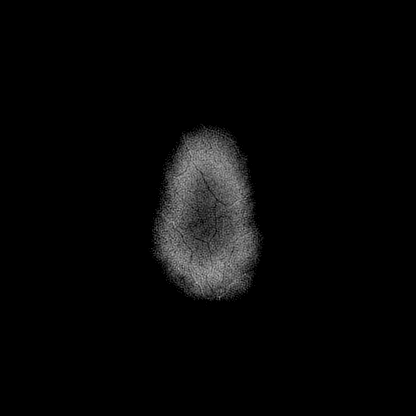

[Series 11: FLAIR · axial · 3.0mm · 0.53mm/px · z∈[-134,+26]mm · 4 of 55 slices shown]
[im 1/55]
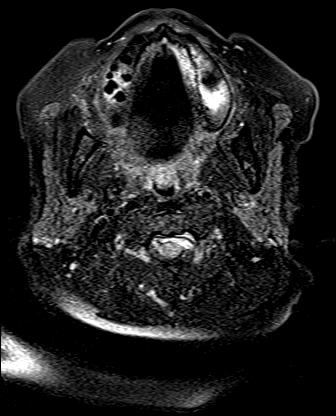
[im 19/55]
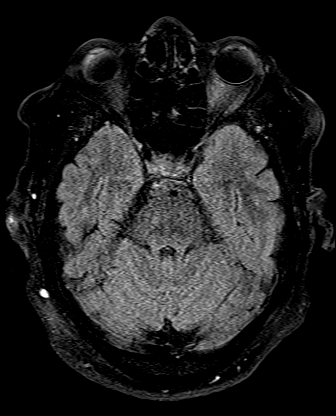
[im 37/55]
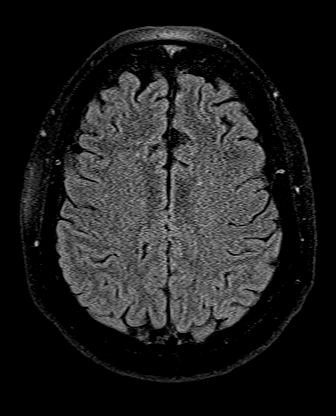
[im 55/55]
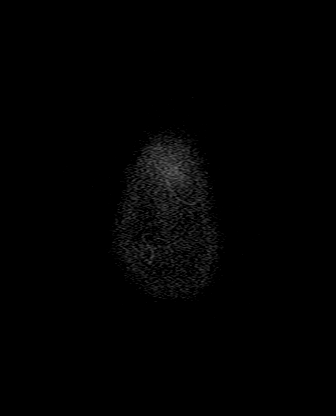

[Series 12: mag_images · axial · 3.0mm · 0.90mm/px · z∈[-140,+35]mm · 4 of 60 slices shown]
[im 1/60]
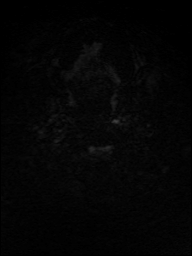
[im 20/60]
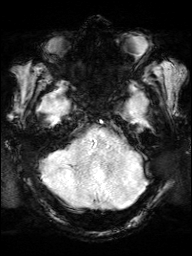
[im 40/60]
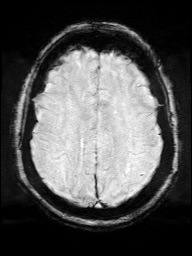
[im 60/60]
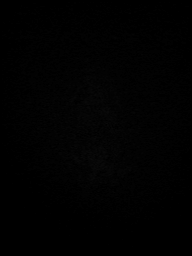

[Series 13: pha_images · axial · 3.0mm · 0.90mm/px · z∈[-140,+29]mm · 4 of 58 slices shown]
[im 1/58]
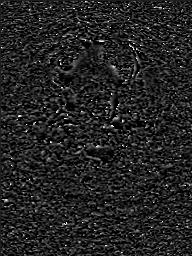
[im 20/58]
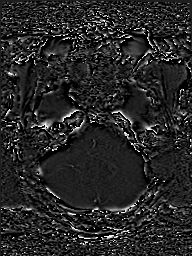
[im 39/58]
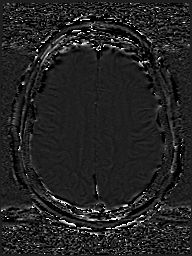
[im 58/58]
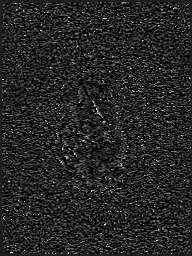

[Series 14: swi_images · axial · 3.0mm · 0.90mm/px · z∈[-140,+35]mm · 4 of 60 slices shown]
[im 1/60]
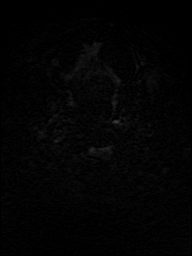
[im 20/60]
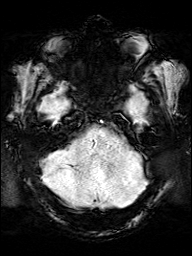
[im 40/60]
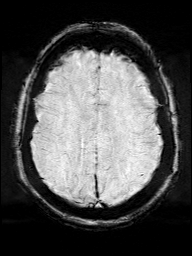
[im 60/60]
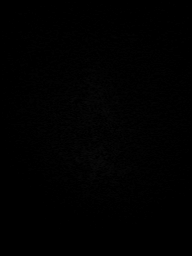

[Series 16: T1 · axial · 1.0mm · 0.98mm/px · z∈[-132,+41]mm · 13 of 176 slices shown (2 of 2)]
[im 1/176]
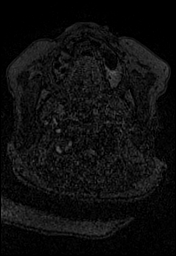
[im 15/176]
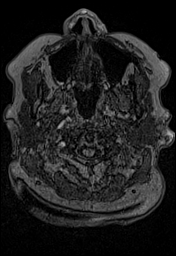
[im 30/176]
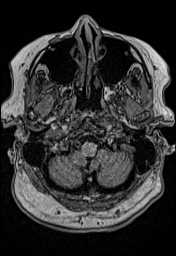
[im 44/176]
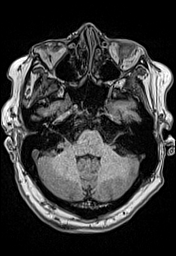
[im 59/176]
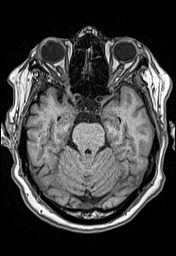
[im 73/176]
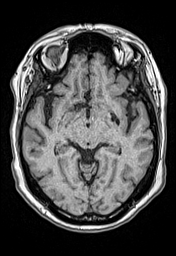
[im 88/176]
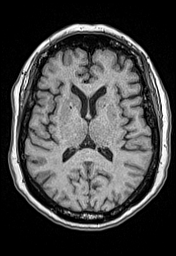
[im 103/176]
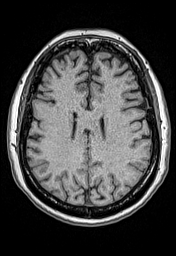
[im 117/176]
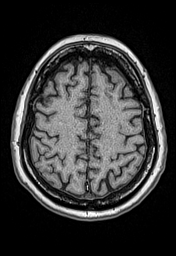
[im 132/176]
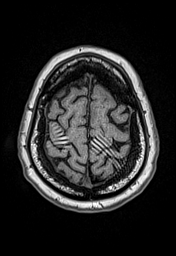
[im 146/176]
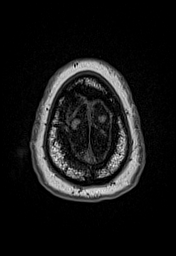
[im 161/176]
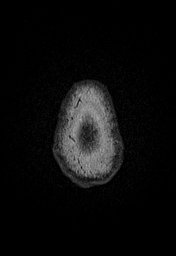
[im 176/176]
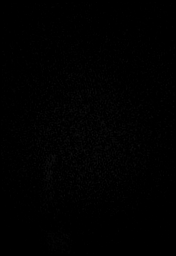

[Series 17: T2 · coronal · 5.0mm · 0.57mm/px · 2 of 29 slices shown (2 of 2)]
[im 1/29]
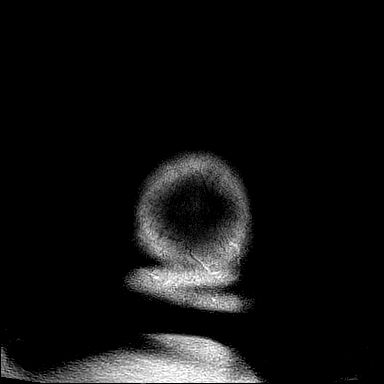
[im 29/29]
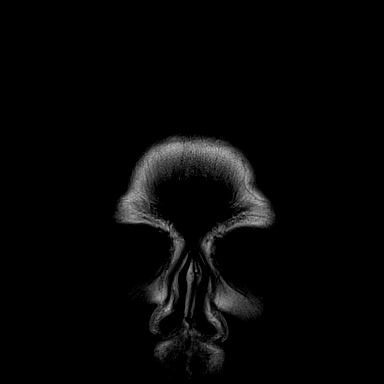

[48 of 48 positions shown; findings below may reference images not displayed]

FINDINGS: Brain: No diffusion-weighted signal abnormality. No intracranial
hemorrhage. No midline shift, ventriculomegaly or extra-axial fluid
collection. No mass lesion. Cerebral volume is within normal limits.
Mild chronic microvascular ischemic changes.

Vascular: T2 hyperintense signal involving the proximal left V4
segment may reflect slow flow versus chronic thrombosis. Remaining
major intracranial flow voids are proximally preserved.

Skull and upper cervical spine: Normal marrow signal.

Sinuses/Orbits: Normal orbits. Clear paranasal sinuses. Trace left
mastoid effusion.

Other: None.
IMPRESSION: No acute intracranial process. Mild chronic microvascular ischemic
changes.

Left V4 segment T2 hyperintense signal, slow flow versus chronic
thrombosis.

## 2019-11-17 ENCOUNTER — Ambulatory Visit: Payer: Medicare Other | Admitting: Family Medicine

## 2019-11-19 ENCOUNTER — Other Ambulatory Visit: Payer: Self-pay | Admitting: Family Medicine

## 2019-11-19 DIAGNOSIS — F324 Major depressive disorder, single episode, in partial remission: Secondary | ICD-10-CM

## 2019-11-19 NOTE — Telephone Encounter (Signed)
Requested Prescriptions  Pending Prescriptions Disp Refills   DULoxetine (CYMBALTA) 60 MG capsule [Pharmacy Med Name: DULOXETINE HCL DR CAPS 60MG ] 90 capsule 0    Sig: TAKE 1 CAPSULE DAILY. ( NEED APPOINTMENT IN OCTOBER )     Psychiatry: Antidepressants - SNRI Passed - 11/19/2019  3:26 AM      Passed - Completed PHQ-2 or PHQ-9 in the last 360 days      Passed - Last BP in normal range    BP Readings from Last 1 Encounters:  08/18/19 127/66         Passed - Valid encounter within last 6 months    Recent Outpatient Visits          3 months ago Prediabetes   [facility] [place], [facility], FNP   4 months ago Arthralgia, unspecified joint   [facility], [facility], FNP   6 months ago Essential hypertension   [facility], [facility], FNP   8 months ago Gastroesophageal reflux disease without esophagitis   [facility], DO   9 months ago Acute left-sided low back pain with left-sided sciatica   [facility] [place], [place], [place]

## 2019-11-20 ENCOUNTER — Encounter: Payer: Self-pay | Admitting: Family Medicine

## 2019-11-20 ENCOUNTER — Other Ambulatory Visit: Payer: Self-pay

## 2019-11-20 ENCOUNTER — Ambulatory Visit (INDEPENDENT_AMBULATORY_CARE_PROVIDER_SITE_OTHER): Payer: Medicare Other | Admitting: Family Medicine

## 2019-11-20 VITALS — BP 132/65 | HR 58 | Resp 18 | Ht 64.5 in | Wt 315.0 lb

## 2019-11-20 DIAGNOSIS — Z23 Encounter for immunization: Secondary | ICD-10-CM

## 2019-11-20 DIAGNOSIS — I10 Essential (primary) hypertension: Secondary | ICD-10-CM

## 2019-11-20 DIAGNOSIS — R8290 Unspecified abnormal findings in urine: Secondary | ICD-10-CM | POA: Diagnosis not present

## 2019-11-20 DIAGNOSIS — R7303 Prediabetes: Secondary | ICD-10-CM | POA: Diagnosis not present

## 2019-11-20 DIAGNOSIS — N390 Urinary tract infection, site not specified: Secondary | ICD-10-CM

## 2019-11-20 LAB — POCT URINALYSIS DIPSTICK
Bilirubin, UA: NEGATIVE
Blood, UA: NEGATIVE
Glucose, UA: NEGATIVE
Ketones, UA: NEGATIVE
Nitrite, UA: NEGATIVE
Protein, UA: NEGATIVE
Spec Grav, UA: <=1.005 — AB (ref 1.010–1.025)
Urobilinogen, UA: 0.2 E.U./dL
pH, UA: 5 (ref 5.0–8.0)

## 2019-11-20 LAB — POCT GLYCOSYLATED HEMOGLOBIN (HGB A1C): Hemoglobin A1C: 5.6 % (ref 4.0–5.6)

## 2019-11-20 MED ORDER — NITROFURANTOIN MONOHYD MACRO 100 MG PO CAPS: 100.0000 mg | ORAL_CAPSULE | Freq: Two times a day (BID) | ORAL | 0 refills | Status: AC

## 2019-11-20 NOTE — Assessment & Plan Note (Signed)
A1C 6.0%, stable from last visit at 6.0% on 08/18/2019.  Continued encouraged dietary and lifestyle modifications and will continue to recheck A1C every 3 months.  Plan: 1. Continue dietary and lifestyle modifications 2. RTC in 3 months for re-evaluation

## 2019-11-20 NOTE — Assessment & Plan Note (Signed)
Pt > age 84.  Needing shingles vaccine.  VIS provided.  Plan: 1. Administer shingles vaccine today.

## 2019-11-20 NOTE — Patient Instructions (Addendum)
Continue all medications as directed  Follow up with Dr. Daisy Blossom office at [phone] review your MRI results.  As we discussed, you can stop the Losartan due to your cough at this time.  You can learn more information online about your diabetes at American Diabetes Association: http://www.diabetes.org/ - General self-care (diet, medications, blood sugar checks). - Diet recommendations - There are even recipes available for you to look at and try.  We will plan to see you back in 3 months for hypertension and prediabetes follow up visit  You will receive a survey after today's visit either digitally by e-mail or paper by USPS mail. Your experiences and feedback matter to [place].  Please respond so we know how we are doing as we provide care for you.  Call us with any questions/concerns/needs.  It is my goal to be available to you for your health concerns.  Thanks for choosing me to be a partner in your healthcare needs!  Charlaine Dalton, FNP-C Family Nurse Practitioner [facility] Phone: [phone]

## 2019-11-20 NOTE — Assessment & Plan Note (Signed)
See UTI A/P

## 2019-11-20 NOTE — Progress Notes (Signed)
Subjective:    Patient ID: Lindsey Jacobs, female    DOB: 09/25/1935, 84 y.o.   MRN: [id number]  Lindsey Jacobs is a 84 y.o. female presenting on 11/20/2019 for Hypertension and Prediabetes (foul odor urine x 3 weeks, she denies any other symtpoms of UTI.)   HPI   Lindsey Jacobs presents to clinic for a follow up on her hypertension, prediabetes and concerns for foul urine odor x 3 weeks.  Denies fevers, dysuria, urinary frequency, urgency, hesitancy, feeling of incomplete emptying, or hematuria  Hypertension - She is checking BP at home or outside of clinic.    - Current medications: losartan 103m daily and triamterene-HCTZ 37.5-25mg daily, tolerating well - She is symptomatic with intermittent cough. - Pt denies headache, lightheadedness, dizziness, changes in vision, chest tightness/pressure, palpitations, leg swelling, sudden loss of speech or loss of consciousness. - She  reports no regular exercise routine. - Her diet is moderate in salt, moderate in fat, and moderate in carbohydrates.  [facility] 03/14/2019 02/03/2019 10/15/2018  Decreased Interest 0 0 0  Down, Depressed, Hopeless 0 0 0  PHQ - 2 Score 0 0 0  Altered sleeping 0 0 0  Tired, decreased energy 0 0 0  Change in appetite 0 0 0  Feeling bad or failure about yourself  0 0 0  Trouble concentrating 0 0 0  Moving slowly or fidgety/restless 0 0 0  Suicidal thoughts 0 0 0  PHQ-9 Score 0 0 0  Difficult doing work/chores Not difficult at all Not difficult at all Not difficult at all    Social History   Tobacco Use  . Smoking status: Never Smoker  . Smokeless tobacco: Never Used  Vaping Use  . Vaping Use: Never used  Substance Use Topics  . Alcohol use: Never  . Drug use: Never    Review of Systems  Constitutional: Negative.   HENT: Negative.   Eyes: Negative.   Respiratory: Positive for cough. Negative for apnea, choking, chest tightness, shortness of breath, wheezing and stridor.   Cardiovascular:  Negative.   Gastrointestinal: Negative.   Endocrine: Negative.   Genitourinary: Negative.        Urinary odor  Musculoskeletal: Negative.   Skin: Negative.   Allergic/Immunologic: Negative.   Neurological: Negative.   Hematological: Negative.   Psychiatric/Behavioral: Negative.    Per HPI unless specifically indicated above     Objective:    BP 132/65 (BP Location: Right Arm, Patient Position: Sitting, Cuff Size: Normal)   Pulse (!) 58   Resp 18   Ht 5' 4.5" (1.638 m)   Wt (!) 315 lb (142.9 kg)   LMP  (LMP Unknown)   SpO2 100%   BMI 53.23 kg/m   Wt Readings from Last 3 Encounters:  11/20/19 (!) 315 lb (142.9 kg)  08/18/19 (!) 313 lb (142 kg)  06/25/19 (!) 315 lb (142.9 kg)    Physical Exam Vitals and nursing note reviewed.  Constitutional:      General: She is not in acute distress.    Appearance: Normal appearance. She is well-developed and well-groomed. She is morbidly obese. She is not ill-appearing or toxic-appearing.  HENT:     Head: Normocephalic and atraumatic.     Nose:     Comments: FLizbeth Barkis in place, covering mouth and nose. Eyes:     General: Lids are normal. Vision grossly intact.        Right eye: No discharge.        Left  eye: No discharge.     Extraocular Movements: Extraocular movements intact.     Conjunctiva/sclera: Conjunctivae normal.     Pupils: Pupils are equal, round, and reactive to light.  Cardiovascular:     Rate and Rhythm: Normal rate and regular rhythm.     Pulses: Normal pulses.          Dorsalis pedis pulses are 2+ on the right side and 2+ on the left side.     Heart sounds: Normal heart sounds. No murmur heard.  No friction rub. No gallop.   Pulmonary:     Effort: Pulmonary effort is normal. No respiratory distress.     Breath sounds: Normal breath sounds.  Abdominal:     General: Bowel sounds are normal. There is no distension.     Palpations: Abdomen is soft.     Tenderness: There is no abdominal tenderness. There is no  right CVA tenderness, left CVA tenderness or guarding.  Musculoskeletal:     Right lower leg: No edema.     Left lower leg: No edema.  Skin:    General: Skin is warm and dry.     Capillary Refill: Capillary refill takes less than 2 seconds.  Neurological:     General: No focal deficit present.     Mental Status: She is alert and oriented to person, place, and time.     Comments: In wheelchair  Psychiatric:        Attention and Perception: Attention and perception normal.        Mood and Affect: Mood and affect normal.        Speech: Speech normal.        Behavior: Behavior normal. Behavior is cooperative.        Thought Content: Thought content normal.        Cognition and Memory: Cognition and memory normal.        Judgment: Judgment normal.    Results for orders placed or performed in visit on 11/20/19  POCT glycosylated hemoglobin (Hb A1C)  Result Value Ref Range   Hemoglobin A1C 5.6 4.0 - 5.6 %   HbA1c POC (<> result, manual entry)     HbA1c, POC (prediabetic range)     HbA1c, POC (controlled diabetic range)    POCT Urinalysis Dipstick  Result Value Ref Range   Color, UA Yellow    Clarity, UA clear    Glucose, UA Negative Negative   Bilirubin, UA negative    Ketones, UA negative    Spec Grav, UA <=1.005 (A) 1.010 - 1.025   Blood, UA negative    pH, UA 5.0 5.0 - 8.0   Protein, UA Negative Negative   Urobilinogen, UA 0.2 0.2 or 1.0 E.U./dL   Nitrite, UA negative    Leukocytes, UA Small (1+) (A) Negative   Appearance     Odor        Assessment & Plan:   Problem List Items Addressed This Visit      Cardiovascular and Mediastinum   Essential hypertension - Primary    Controlled hypertension.  BP is at goal < 130/80.  Pt is working on lifestyle modifications.  Taking medications tolerating well without side effects.  Reports she had met with Lindsey Jacobs and was discussed to stop her losartan, reports she had stopped it and her cough had gotten better, has restarted  the medications, discussed can trial off of the medications to look at cough resolution and to monitor her blood pressure 1-2x  per day Complications: Morbid obesity, OSA, GERD, prediabetes  Plan: 1. Can stop the losartan 65m daily and continue the triamterene-HCTZ 37.5-12.576mdaily 2. Obtain labs at next visit  3. Encouraged heart healthy diet and increasing exercise to 30 minutes most days of the week, going no more than 2 days in a row without exercise. 4. Check BP 1-2 x per week at home, keep log, and bring to clinic at next appointment. 5. Follow up 3 months.         Genitourinary   Urinary tract infection without hematuria    Abnormal urine odor, POCT U/A showing leukocytes, will send for culture.  Will treat with Macrobid 10069mID x 5 days while awaiting urine culture results.  Plan: 1. Begin macrobid 100m73mD x 5 days 2. Urine sent to the lab for culture 3. RTC PRN      Relevant Medications   nitrofurantoin, macrocrystal-monohydrate, (MACROBID) 100 MG capsule     Other   Prediabetes    A1C 6.0%, stable from last visit at 6.0% on 08/18/2019.  Continued encouraged dietary and lifestyle modifications and will continue to recheck A1C every 3 months.  Plan: 1. Continue dietary and lifestyle modifications 2. RTC in 3 months for re-evaluation      Relevant Orders   POCT glycosylated hemoglobin (Hb A1C) (Completed)   Flu vaccine need    Pt > age 103. 62eeds annual influenza vaccine.  VIS provided.  Plan: 1. Administer high dose fluzone today.       Relevant Orders   Flu Vaccine QUAD High Dose(Fluad) (Completed)   Need for shingles vaccine    Pt > age 103. 89eeding shingles vaccine.  VIS provided.  Plan: 1. Administer shingles vaccine today.       Relevant Orders   Varicella-zoster vaccine IM (Shingrix) (Completed)   Abnormal urine odor    See UTI A/P      Relevant Orders   POCT Urinalysis Dipstick (Completed)    Other Visit Diagnoses    Abnormal  urinalysis       Relevant Orders   Urine Culture      Meds ordered this encounter  Medications  . nitrofurantoin, macrocrystal-monohydrate, (MACROBID) 100 MG capsule    Sig: Take 1 capsule (100 mg total) by mouth 2 (two) times daily for 5 days.    Dispense:  10 capsule    Refill:  0    Follow up plan: Return in about 3 months (around 02/20/2020) for HTN, Prediabetes follow up.   NicoHarlin RainP Butler[place] Nurse Practitioner [facility] 11/20/2019, 3:05 PM

## 2019-11-20 NOTE — Assessment & Plan Note (Signed)
Controlled hypertension.  BP is at goal < 130/80.  Pt is working on lifestyle modifications.  Taking medications tolerating well without side effects.  Reports she had met with Dr. Melrose Nakayama and was discussed to stop her losartan, reports she had stopped it and her cough had gotten better, has restarted the medications, discussed can trial off of the medications to look at cough resolution and to monitor her blood pressure 2-4S per day Complications: Morbid obesity, OSA, GERD, prediabetes  Plan: 1. Can stop the losartan 25mg  daily and continue the triamterene-HCTZ 37.5-12.5mg  daily 2. Obtain labs at next visit  3. Encouraged heart healthy diet and increasing exercise to 30 minutes most days of the week, going no more than 2 days in a row without exercise. 4. Check BP 1-2 x per week at home, keep log, and bring to clinic at next appointment. 5. Follow up 3 months.

## 2019-11-20 NOTE — Assessment & Plan Note (Signed)
Abnormal urine odor, POCT U/A showing leukocytes, will send for culture.  Will treat with Macrobid 100mg  BID x 5 days while awaiting urine culture results.  Plan: 1. Begin macrobid 100mg  BID x 5 days 2. Urine sent to the lab for culture 3. RTC PRN

## 2019-11-20 NOTE — Assessment & Plan Note (Signed)
Pt > age 82.  Needs annual influenza vaccine.  VIS provided.  Plan: 1. Administer high dose fluzone today.  

## 2019-11-21 LAB — URINE CULTURE
MICRO NUMBER:: 11161035
SPECIMEN QUALITY:: ADEQUATE

## 2019-11-25 ENCOUNTER — Telehealth: Payer: Self-pay

## 2019-11-25 NOTE — Telephone Encounter (Signed)
Pt called back in to follow up on call back. Pt says that she hasn't started taking medication due to reading the side effects. Pt would like to discuss medication and/or be prescribed something different. Pt says that she wasn't initially told that she had a Rx sent in to pharmacy. Pt wasn't given her results.    Pt would like further assistance as soon as possible.   [phone] -

## 2019-11-25 NOTE — Telephone Encounter (Signed)
Tried to call the patient to get the side effects that she is experiencing from the macrobid but she did not answer. Left a detailed message asking the patient to call back and give further details letting us know the side effects she is experiencing and how many more pills she has left of the abx. She was prescribed this on 11/20/19.  Awaiting call back from the patient.   Copied from CRM [id number]. Topic: General - Other >> Nov 24, 2019  1:22 PM Dalphine Handing A wrote: Patient would like a callback from [facility] nurse in regards to an alternative medication being called in for the  nitrofurantoin, macrocrystal-monohydrate, (MACROBID) 100 MG capsule . Patient is concerned with side effects associated with medication. Please advise

## 2019-11-26 NOTE — Telephone Encounter (Signed)
Called pts house phone could not leave message. Phone kept ringing.  Will route result note to [facility] Nurse Triage for follow up when patient returns call to clinic. Nurse may give results to patient if they return call. CRM created for this message.   KP

## 2019-11-26 NOTE — Telephone Encounter (Signed)
Patient returned call- she is very concerned about possible deadly SE listed with original antibiotic- she has never taken it before and would like alternative. Informed patient PCP advised Bactrim alternative- Patient is agreeable and request Rx. Patient states thank you

## 2019-11-26 NOTE — Telephone Encounter (Signed)
Each medication comes with a side effect.  Which side effects is she concerned with?  We have the option to switch to bactrim DS.  If she is not having continued urinary symptoms, we can hold off on the treatment.

## 2019-11-26 NOTE — Telephone Encounter (Signed)
Please Advise.  KP

## 2019-11-27 ENCOUNTER — Other Ambulatory Visit: Payer: Self-pay | Admitting: Family Medicine

## 2019-11-27 ENCOUNTER — Other Ambulatory Visit: Payer: Self-pay

## 2019-11-27 DIAGNOSIS — N390 Urinary tract infection, site not specified: Secondary | ICD-10-CM

## 2019-11-27 MED ORDER — SULFAMETHOXAZOLE-TRIMETHOPRIM 800-160 MG PO TABS: 1.0000 | ORAL_TABLET | Freq: Two times a day (BID) | ORAL | 0 refills | Status: AC

## 2019-11-27 NOTE — Telephone Encounter (Signed)
Rx sent to pharmacy on file for Bactrim DS

## 2019-11-27 NOTE — Telephone Encounter (Signed)
Pt. Calling to report the Bactrim has not been sent to her pharmacy. "I'm still having symptoms and I really need it." Please advise pt.

## 2019-11-27 NOTE — Telephone Encounter (Signed)
Called pt let her know that bactrim DS was sent to pharmacy. Pt verbalized understanding.  KP

## 2019-11-27 NOTE — Telephone Encounter (Signed)
Please Advise. Pt wants to try Bactrim DS.  KP

## 2019-11-28 ENCOUNTER — Telehealth: Payer: Self-pay

## 2019-11-28 ENCOUNTER — Other Ambulatory Visit: Payer: Self-pay | Admitting: Family Medicine

## 2019-11-28 DIAGNOSIS — I10 Essential (primary) hypertension: Secondary | ICD-10-CM

## 2019-11-28 MED ORDER — AMLODIPINE BESYLATE 5 MG PO TABS: 5.0000 mg | ORAL_TABLET | Freq: Every day | ORAL | 0 refills | Status: DC

## 2019-11-28 NOTE — Telephone Encounter (Signed)
She is on triamterene-HCTZ and losartan for her BP.  She had a discussion with Neurology about stopping her losartan for cough.  We discussed should could stop the losartan for evaluation if that makes her cough lessen but should be continuing the triamterene-HCTZ for her BP.  TY

## 2019-11-28 NOTE — Telephone Encounter (Signed)
Copied from CRM [id number]. Topic: General - Inquiry >> Nov 27, 2019  4:33 PM Daphine Deutscher D wrote: Pt called asking if she is supposed to be taking something for her blood pressure.  She was taking Losartin because of her cough.  Her blood pressure has been 116/65 pulse was 69 today.    She wants to know if she needs to be taking something else.  CB#  [phone]

## 2019-11-28 NOTE — Telephone Encounter (Signed)
Sent in Rx for amlodipine.  Have patient schedule appt for 2-3 weeks for now for BP recheck in office.  TY

## 2020-02-02 ENCOUNTER — Other Ambulatory Visit: Payer: Self-pay | Admitting: Family Medicine

## 2020-02-02 DIAGNOSIS — I10 Essential (primary) hypertension: Secondary | ICD-10-CM

## 2020-02-10 ENCOUNTER — Other Ambulatory Visit: Payer: Self-pay

## 2020-02-10 ENCOUNTER — Encounter: Payer: Self-pay | Admitting: Family Medicine

## 2020-02-10 ENCOUNTER — Telehealth (INDEPENDENT_AMBULATORY_CARE_PROVIDER_SITE_OTHER): Payer: Medicare Other | Admitting: Family Medicine

## 2020-02-10 DIAGNOSIS — I10 Essential (primary) hypertension: Secondary | ICD-10-CM

## 2020-02-10 MED ORDER — LOSARTAN POTASSIUM 50 MG PO TABS: 25.0000 mg | ORAL_TABLET | Freq: Every day | ORAL | 1 refills | Status: DC

## 2020-02-10 NOTE — Progress Notes (Signed)
Virtual Visit via Telephone  The purpose of this virtual visit is to provide medical care while limiting exposure to the novel coronavirus (COVID19) for both patient and office staff.  Consent was obtained for phone visit:  Yes.   Answered questions that patient had about telehealth interaction:  Yes.   I discussed the limitations, risks, security and privacy concerns of performing an evaluation and management service by telephone. I also discussed with the patient that there may be a patient responsible charge related to this service. The patient expressed understanding and agreed to proceed.  Patient is at home and is accessed via telephone Services are provided by Charlaine Dalton, FNP-C from [facility])  ---------------------------------------------------------------------- Chief Complaint  Patient presents with  . Hypertension    123/73, pulse 69     S: Reviewed CMA documentation. I have called patient and gathered additional HPI as follows:  Patient presents for virtual telemedicine visit via telephone for follow up on her hypertension.  Reports that she has been taking her medications as directed and BP this morning was 123/73.  Denies any headaches, visual changes, chest pain, palpitations, or leg swelling.  Patient is currently home Denies any high risk travel to areas of current concern for COVID19. Denies any known or suspected exposure to person with or possibly with COVID19.  Past Medical History:  Diagnosis Date  . Allergy   . Colon polyps   . Depression   . GERD (gastroesophageal reflux disease)   . Glaucoma   . Hyperlipidemia   . Hypertension   . Sleep apnea   . Urinary incontinence    Social History   Tobacco Use  . Smoking status: Never Smoker  . Smokeless tobacco: Never Used  Vaping Use  . Vaping Use: Never used  Substance Use Topics  . Alcohol use: Never  . Drug use: Never    Current Outpatient Medications:  .   Carboxymeth-Glyc-Polysorb PF (REFRESH DIGITAL PF) 0.5-1-0.5 % SOLN, Apply to eye., Disp: , Rfl:  .  Cholecalciferol (VITAMIN D3 MAXIMUM STRENGTH PO), Take by mouth., Disp: , Rfl:  .  diclofenac sodium (VOLTAREN) 1 % GEL, Apply 4 g topically 4 (four) times daily., Disp: 200 g, Rfl: 5 .  dorzolamide (TRUSOPT) 2 % ophthalmic solution, 1 drop 3 (three) times daily., Disp: , Rfl:  .  dorzolamide-timolol (COSOPT) 22.3-6.8 MG/ML ophthalmic solution, , Disp: , Rfl:  .  DULoxetine (CYMBALTA) 60 MG capsule, TAKE 1 CAPSULE DAILY. ( NEED APPOINTMENT IN OCTOBER ), Disp: 90 capsule, Rfl: 0 .  Magnesium Oxide 500 MG TABS, Take by mouth. Patient is taking 2 tablet once daily., Disp: , Rfl:  .  meclizine (ANTIVERT) 12.5 MG tablet, Take 1 tablet (12.5 mg total) by mouth 3 (three) times daily as needed for dizziness., Disp: 90 tablet, Rfl: 1 .  Multiple Vitamins-Minerals (ADVANCED EYE HEALTH PO), Take by mouth., Disp: , Rfl:  .  naproxen (NAPROSYN) 500 MG tablet, Take by mouth., Disp: , Rfl:  .  pantoprazole (PROTONIX) 40 MG tablet, TAKE 1 TABLET DAILY, Disp: 90 tablet, Rfl: 3 .  triamterene-hydrochlorothiazide (MAXZIDE-25) 37.5-25 MG tablet, TAKE 1 TABLET DAILY (DOSE CHANGED FROM 75-25. NEED OFFICE VISIT), Disp: 90 tablet, Rfl: 1 .  losartan (COZAAR) 50 MG tablet, Take 0.5 tablets (25 mg total) by mouth daily., Disp: 90 tablet, Rfl: 1  Depression screen [facility] 2/9 03/14/2019 02/03/2019 10/15/2018  Decreased Interest 0 0 0  Down, Depressed, Hopeless 0 0 0  PHQ - 2 Score  0 0 0  Altered sleeping 0 0 0  Tired, decreased energy 0 0 0  Change in appetite 0 0 0  Feeling bad or failure about yourself  0 0 0  Trouble concentrating 0 0 0  Moving slowly or fidgety/restless 0 0 0  Suicidal thoughts 0 0 0  PHQ-9 Score 0 0 0  Difficult doing work/chores Not difficult at all Not difficult at all Not difficult at all    GAD 7 : Generalized Anxiety Score 05/13/2018  Nervous, Anxious, on Edge 1  Control/stop worrying 0  Worry  too much - different things 0  Trouble relaxing 0  Restless 0  Easily annoyed or irritable 0  Afraid - awful might happen 0  Total GAD 7 Score 1  Anxiety Difficulty Not difficult at all    -------------------------------------------------------------------------- O: No physical exam performed due to remote telephone encounter.  Physical Exam: Patient remotely monitored without video.  Verbal communication appropriate.  Cognition normal.  Recent Results (from the past 2160 hour(s))  Urine Culture     Status: None   Collection Time: 11/20/19 11:56 AM   Specimen: Urine  Result Value Ref Range   MICRO NUMBER: [id number]    SPECIMEN QUALITY: Adequate    Sample Source NOT GIVEN    STATUS: FINAL    ISOLATE 1:      Greater than 100,000 CFU/mL of Non-uropathogenic Gram positive organism May represent colonizers from external and internal genitalia. No further testing (including susceptibility) will be performed.  POCT glycosylated hemoglobin (Hb A1C)     Status: Normal   Collection Time: 11/20/19  1:58 PM  Result Value Ref Range   Hemoglobin A1C 5.6 4.0 - 5.6 %   HbA1c POC (<> result, manual entry)     HbA1c, POC (prediabetic range)     HbA1c, POC (controlled diabetic range)    POCT Urinalysis Dipstick     Status: Abnormal   Collection Time: 11/20/19  1:58 PM  Result Value Ref Range   Color, UA Yellow    Clarity, UA clear    Glucose, UA Negative Negative   Bilirubin, UA negative    Ketones, UA negative    Spec Grav, UA <=1.005 (A) 1.010 - 1.025   Blood, UA negative    pH, UA 5.0 5.0 - 8.0   Protein, UA Negative Negative   Urobilinogen, UA 0.2 0.2 or 1.0 E.U./dL   Nitrite, UA negative    Leukocytes, UA Small (1+) (A) Negative   Appearance     Odor      -------------------------------------------------------------------------- A&P:  Problem List Items Addressed This Visit      Cardiovascular and Mediastinum   Essential hypertension    Currently stable and well controlled  with losartan 25mg  daily, and triamterene-hydrochlorothiazide 37.5-25mg  daily.  Will continue this medication regimen.  RTC in 6 months for follow up visit.      Relevant Medications   losartan (COZAAR) 50 MG tablet      Meds ordered this encounter  Medications  . losartan (COZAAR) 50 MG tablet    Sig: Take 0.5 tablets (25 mg total) by mouth daily.    Dispense:  90 tablet    Refill:  1    Follow-up: - Return in 6 months for hypertension follow up visit  Patient verbalizes understanding with the above medical recommendations including the limitation of remote medical advice.  Specific follow-up and call-back criteria were given for patient to follow-up or seek medical care more urgently if needed.  -  Time spent in direct consultation with patient on phone: 6 minutes  Charlaine Dalton, FNP-C [facility] 02/10/2020, 2:05 PM

## 2020-02-10 NOTE — Assessment & Plan Note (Signed)
Currently stable and well controlled with losartan 25mg  daily, and triamterene-hydrochlorothiazide 37.5-25mg  daily.  Will continue this medication regimen.  RTC in 6 months for follow up visit.

## 2020-02-11 ENCOUNTER — Telehealth: Payer: Self-pay | Admitting: Family Medicine

## 2020-02-11 NOTE — Telephone Encounter (Signed)
Pt states she has gotten her covid vaccine and her booster. Pt went through a drive through with her daughter.

## 2020-02-12 ENCOUNTER — Ambulatory Visit: Payer: Medicare Other | Admitting: Family Medicine

## 2020-02-18 ENCOUNTER — Other Ambulatory Visit: Payer: Self-pay

## 2020-02-18 DIAGNOSIS — F324 Major depressive disorder, single episode, in partial remission: Secondary | ICD-10-CM

## 2020-02-18 MED ORDER — DULOXETINE HCL 60 MG PO CPEP: ORAL_CAPSULE | ORAL | 0 refills | Status: DC

## 2020-02-23 ENCOUNTER — Ambulatory Visit: Payer: Medicare Other | Admitting: Family Medicine

## 2020-04-06 ENCOUNTER — Ambulatory Visit: Payer: Medicare Other

## 2020-04-07 ENCOUNTER — Other Ambulatory Visit: Payer: Self-pay | Admitting: Family Medicine

## 2020-04-07 DIAGNOSIS — M159 Polyosteoarthritis, unspecified: Secondary | ICD-10-CM

## 2020-04-07 DIAGNOSIS — M8949 Other hypertrophic osteoarthropathy, multiple sites: Secondary | ICD-10-CM

## 2020-04-07 MED ORDER — NAPROXEN 500 MG PO TABS: 500.0000 mg | ORAL_TABLET | Freq: Every day | ORAL | 1 refills | Status: DC | PRN

## 2020-04-07 NOTE — Telephone Encounter (Signed)
naproxen (NAPROSYN) 500 MG tablet   01/04/2016    Sig - Route: Take by mouth. - Oral   Class: Historical Med    Pt has called and states that Dr Kirtland Bouchard had told her in the last few times she saw him that it would be ok for her to start taking the above med again. Pt states at one time he told her to stop but since he said she could take a few now when her leg was bothering her that she needs a refill. If ok with Dr Kirtland Bouchard send to  [facility] - [place], [place] - [address] Phone:  [phone]  Fax:  [phone]

## 2020-05-03 ENCOUNTER — Telehealth: Payer: Self-pay

## 2020-05-03 NOTE — Telephone Encounter (Signed)
Copied from CRM [id number]. Topic: General - Other >> May 03, 2020  9:17 AM Lindsey Jacobs wrote: Reason for CRM: Pt requested appt today for possible uti ut there were no appts available. Pt would like to know if she could just bring in an urine sample. Pt requests call back. Cb# [phone]

## 2020-05-04 ENCOUNTER — Ambulatory Visit (INDEPENDENT_AMBULATORY_CARE_PROVIDER_SITE_OTHER): Payer: Medicare Other | Admitting: Family Medicine

## 2020-05-04 ENCOUNTER — Ambulatory Visit (INDEPENDENT_AMBULATORY_CARE_PROVIDER_SITE_OTHER): Payer: Medicare Other

## 2020-05-04 ENCOUNTER — Encounter: Payer: Self-pay | Admitting: Family Medicine

## 2020-05-04 ENCOUNTER — Other Ambulatory Visit: Payer: Self-pay

## 2020-05-04 VITALS — Ht 65.0 in | Wt 315.0 lb

## 2020-05-04 VITALS — BP 141/72 | HR 58 | Ht 65.0 in | Wt 315.0 lb

## 2020-05-04 DIAGNOSIS — N3001 Acute cystitis with hematuria: Secondary | ICD-10-CM | POA: Diagnosis not present

## 2020-05-04 DIAGNOSIS — Z0000 Encounter for general adult medical examination without abnormal findings: Secondary | ICD-10-CM | POA: Diagnosis not present

## 2020-05-04 DIAGNOSIS — R350 Frequency of micturition: Secondary | ICD-10-CM

## 2020-05-04 LAB — POCT URINALYSIS DIPSTICK
Bilirubin, UA: NEGATIVE
Glucose, UA: NEGATIVE
Ketones, UA: NEGATIVE
Nitrite, UA: NEGATIVE
Protein, UA: POSITIVE — AB
Spec Grav, UA: 1.01 (ref 1.010–1.025)
Urobilinogen, UA: 0.2 E.U./dL
pH, UA: 7 (ref 5.0–8.0)

## 2020-05-04 MED ORDER — CIPROFLOXACIN HCL 500 MG PO TABS: 500.0000 mg | ORAL_TABLET | Freq: Two times a day (BID) | ORAL | 0 refills | Status: DC

## 2020-05-04 NOTE — Progress Notes (Signed)
I connected with Lindsey Jacobs today by telephone and verified that I am speaking with the correct person using two identifiers. Location patient: home Location provider: work Persons participating in the virtual visit: Jazlynn, [place] LPN.   I discussed the limitations, risks, security and privacy concerns of performing an evaluation and management service by telephone and the availability of in person appointments. I also discussed with the patient that there may be a patient responsible charge related to this service. The patient expressed understanding and verbally consented to this telephonic visit.    Interactive audio and video telecommunications were attempted between this provider and patient, however failed, due to patient having technical difficulties OR patient did not have access to video capability.  We continued and completed visit with audio only.     Vital signs may be patient reported or missing.  Subjective:   Lindsey Jacobs is a 85 y.o. female who presents for Medicare Annual (Subsequent) preventive examination.  Review of Systems     Cardiac Risk Factors include: advanced age (>77men, >3 women);hypertension;obesity (BMI >30kg/m2);sedentary lifestyle     Objective:    Today's Vitals   05/04/20 1056 05/04/20 1057  Weight: (!) 315 lb (142.9 kg)   Height: 5\' 5"  (1.651 m)   PainSc:  7    Body mass index is 52.42 kg/m.  Advanced Directives 05/04/2020 05/20/2019  Does Patient Have a Medical Advance Directive? Yes Yes  Type of 07/20/2019 of [place];Living will -  Copy of Healthcare Power of Attorney in Chart? No - copy requested -    Current Medications (verified) Outpatient Encounter Medications as of 05/04/2020  Medication Sig  . Carboxymeth-Glyc-Polysorb PF (REFRESH DIGITAL PF) 0.5-1-0.5 % SOLN Apply to eye.  . Cholecalciferol (VITAMIN D3 MAXIMUM STRENGTH PO) Take by mouth.  . ciprofloxacin (CIPRO) 500 MG tablet Take 1 tablet  (500 mg total) by mouth 2 (two) times daily. For 5 days  . diclofenac sodium (VOLTAREN) 1 % GEL Apply 4 g topically 4 (four) times daily.  . dorzolamide-timolol (COSOPT) 22.3-6.8 MG/ML ophthalmic solution   . DULoxetine (CYMBALTA) 60 MG capsule TAKE 1 CAPSULE DAILY. ( NEED APPOINTMENT IN OCTOBER )  . latanoprost (XALATAN) 0.005 % ophthalmic solution 1 drop at bedtime.  . Magnesium Oxide 500 MG TABS Take by mouth. Patient is taking 2 tablet once daily.  . meclizine (ANTIVERT) 12.5 MG tablet Take 1 tablet (12.5 mg total) by mouth 3 (three) times daily as needed for dizziness.  . Multiple Vitamins-Minerals (ADVANCED EYE HEALTH PO) Take by mouth.  . naproxen (NAPROSYN) 500 MG tablet Take 1 tablet (500 mg total) by mouth daily as needed for moderate pain. Take with meal  . pantoprazole (PROTONIX) 40 MG tablet TAKE 1 TABLET DAILY  . triamterene-hydrochlorothiazide (MAXZIDE-25) 37.5-25 MG tablet TAKE 1 TABLET DAILY (DOSE CHANGED FROM 75-25. NEED OFFICE VISIT)  . dorzolamide (TRUSOPT) 2 % ophthalmic solution 1 drop 3 (three) times daily. (Patient not taking: Reported on 05/04/2020)  . losartan (COZAAR) 50 MG tablet Take 0.5 tablets (25 mg total) by mouth daily. (Patient not taking: Reported on 05/04/2020)   No facility-administered encounter medications on file as of 05/04/2020.    Allergies (verified) Amoxapine and related, Diamox [acetazolamide], Keflex [cephalexin], and Tape   History: Past Medical History:  Diagnosis Date  . Allergy   . Colon polyps   . Depression   . GERD (gastroesophageal reflux disease)   . Glaucoma   . Hyperlipidemia   . Hypertension   .  Sleep apnea   . Urinary incontinence    Past Surgical History:  Procedure Laterality Date  . ABDOMINAL HYSTERECTOMY  1984  . BREAST EXCISIONAL BIOPSY Right    benign  . CARPAL TUNNEL RELEASE Right 1990  . CATARACT EXTRACTION, BILATERAL Bilateral 2004  . CESAREAN SECTION  1974  . CHOLECYSTECTOMY  1974  .  ESOPHAGOGASTRODUODENOSCOPY (EGD) WITH PROPOFOL N/A 05/20/2019   Procedure: ESOPHAGOGASTRODUODENOSCOPY (EGD) WITH PROPOFOL;  Surgeon: Pasty Spillersahiliani, Varnita B, MD;  Location: [facility] ENDOSCOPY;  Service: Endoscopy;  Laterality: N/A;  . EYE SURGERY    . JOINT REPLACEMENT    . REPLACEMENT TOTAL KNEE Left 2007  . REPLACEMENT TOTAL KNEE BILATERAL Right 2006  . VARICOSE VEIN SURGERY Left 1965   Family History  Problem Relation Age of Onset  . Heart disease Father   . Colon polyps Father   . Alcohol abuse Father    Social History   Socioeconomic History  . Marital status: Widowed    Spouse name: Not on file  . Number of children: Not on file  . Years of education: Not on file  . Highest education level: Not on file  Occupational History  . Occupation: retired  Tobacco Use  . Smoking status: Never Smoker  . Smokeless tobacco: Never Used  Vaping Use  . Vaping Use: Never used  Substance and Sexual Activity  . Alcohol use: Never  . Drug use: Never  . Sexual activity: Not Currently  Other Topics Concern  . Not on file  Social History Narrative  . Not on file   Social Determinants of Health   Financial Resource Strain: Low Risk   . Difficulty of Paying Living Expenses: Not hard at all  Food Insecurity: No Food Insecurity  . Worried About Programme researcher, broadcasting/film/videounning Out of Food in the Last Year: Never true  . Ran Out of Food in the Last Year: Never true  Transportation Needs: No Transportation Needs  . Lack of Transportation (Medical): No  . Lack of Transportation (Non-Medical): No  Physical Activity: Inactive  . Days of Exercise per Week: 0 days  . Minutes of Exercise per Session: 0 min  Stress: No Stress Concern Present  . Feeling of Stress : Not at all  Social Connections: Not on file    Tobacco Counseling Counseling given: Not Answered   Clinical Intake:  Pre-visit preparation completed: Yes  Pain : 0-10 Pain Score: 7  Pain Type: Chronic pain Pain Location: Back Pain Orientation:  Upper Pain Radiating Towards: down both arms Pain Descriptors / Indicators: Aching Pain Onset: More than a month ago Pain Frequency: Constant Pain Relieving Factors: naproxen  Pain Relieving Factors: naproxen  Nutritional Status: BMI > 30  Obese Nutritional Risks: None Diabetes: No  How often do you need to have someone help you when you read instructions, pamphlets, or other written materials from your doctor or pharmacy?: 1 - Never What is the last grade level you completed in school?: 1212yrs  Diabetic? no  Interpreter Needed?: No  Information entered by :: NAllen LPN   Activities of Daily Living In your present state of health, do you have any difficulty performing the following activities: 05/04/2020  Hearing? Y  Vision? N  Difficulty concentrating or making decisions? Y  Walking or climbing stairs? Y  Dressing or bathing? N  Doing errands, shopping? Y  Comment doesn't really drive  Preparing Food and eating ? Y  Comment daughter manages  Using the Toilet? N  In the past six months, have you  accidently leaked urine? Y  Do you have problems with loss of bowel control? N  Managing your Medications? N  Managing your Finances? N  Housekeeping or managing your Housekeeping? Y  Some recent data might be hidden    Patient Care Team: Malfi, Jodelle Gross, FNP as PCP - General (Family Medicine)  Indicate any recent Medical Services you may have received from other than [facility] providers in the past year (date may be approximate).     Assessment:   This is a routine wellness examination for Oneisha.  Hearing/Vision screen  Hearing Screening   125Hz  250Hz  500Hz  1000Hz  2000Hz  3000Hz  4000Hz  6000Hz  8000Hz   Right ear:           Left ear:           Vision Screening Comments: Regular eye exams, Eye  Dietary issues and exercise activities discussed: Current Exercise Habits: The patient does not participate in regular exercise at present  Goals    . Patient Stated      05/04/2020, no goals      Depression Screen PHQ 2/9 Scores 05/04/2020 03/14/2019 02/03/2019 10/15/2018 05/30/2018 05/13/2018  PHQ - 2 Score 0 0 0 0 0 0  PHQ- 9 Score - 0 0 0 0 0    Fall Risk Fall Risk  05/04/2020 03/14/2019 02/03/2019 10/15/2018 05/30/2018  Falls in the past year? 0 0 0 0 0  Number falls in past yr: - 0 0 0 -  Injury with Fall? - 0 - - -  Risk for fall due to : Impaired balance/gait;Impaired mobility;Medication side effect - - - -  Follow up Falls evaluation completed;Education provided;Falls prevention discussed Falls evaluation completed Falls evaluation completed - Falls evaluation completed    FALL RISK PREVENTION PERTAINING TO THE HOME:  Any stairs in or around the home? No  If so, are there any without handrails? n/a Home free of loose throw rugs in walkways, pet beds, electrical cords, etc? Yes  Adequate lighting in your home to reduce risk of falls? Yes   ASSISTIVE DEVICES UTILIZED TO PREVENT FALLS:  Life alert? No  Use of a cane, walker or w/c? Yes  Grab bars in the bathroom? Yes  Shower chair or bench in shower? Yes  Elevated toilet seat or a handicapped toilet? Yes   TIMED UP AND GO:  Was the test performed? No   Cognitive Function:     6CIT Screen 05/04/2020  What Year? 0 points  What month? 0 points  What time? 0 points  Count back from 20 0 points  Months in reverse 0 points  Repeat phrase 0 points  Total Score 0    Immunizations Immunization History  Administered Date(s) Administered  . Fluad Quad(high Dose 65+) 10/15/2018, 11/20/2019  . Influenza-Unspecified 09/27/2017  . Pneumococcal Conjugate-13 10/30/2013  . Pneumococcal Polysaccharide-23 11/22/2000, 11/18/2010  . Td 02/18/2001  . Zoster 06/19/2006  . Zoster Recombinat (Shingrix) 11/20/2019    TDAP status: Due, Education has been provided regarding the importance of this vaccine. Advised may receive this vaccine at local pharmacy or Health Dept. Aware to provide a copy of the  vaccination record if obtained from local pharmacy or Health Dept. Verbalized acceptance and understanding.  Flu Vaccine status: Up to date  Pneumococcal vaccine status: Up to date  Covid-19 vaccine status: Completed vaccines  Qualifies for Shingles Vaccine? Yes   Zostavax completed Yes   Shingrix Completed?: Yes  Screening Tests Health Maintenance  Topic Date Due  . COVID-19 Vaccine (1)  Never done  . TETANUS/TDAP  02/19/2011  . INFLUENZA VACCINE  08/16/2020  . DEXA SCAN  Completed  . PNA vac Low Risk Adult  Completed  . HPV VACCINES  Aged Out    Health Maintenance  Health Maintenance Due  Topic Date Due  . COVID-19 Vaccine (1) Never done  . TETANUS/TDAP  02/19/2011    Colorectal cancer screening: No longer required.   Mammogram status: Completed 10/17/2019. Repeat every year  Bone Density status: Completed 04/08/2015.   Lung Cancer Screening: (Low Dose CT Chest recommended if Age 54-80 years, 30 pack-year currently smoking OR have quit w/in 15years.) does not qualify.   Lung Cancer Screening Referral: no  Additional Screening:  Hepatitis C Screening: does not qualify;   Vision Screening: Recommended annual ophthalmology exams for early detection of glaucoma and other disorders of the eye. Is the patient up to date with their annual eye exam?  Yes  Who is the provider or what is the name of the office in which the patient attends annual eye exams? [facility] If pt is not established with a provider, would they like to be referred to a provider to establish care? No .   Dental Screening: Recommended annual dental exams for proper oral hygiene  Community Resource Referral / Chronic Care Management: CRR required this visit?  No   CCM required this visit?  No      Plan:     I have personally reviewed and noted the following in the patient's chart:   . Medical and social history . Use of alcohol, tobacco or illicit drugs  . Current medications and  supplements . Functional ability and status . Nutritional status . Physical activity . Advanced directives . List of other physicians . Hospitalizations, surgeries, and ER visits in previous 12 months . Vitals . Screenings to include cognitive, depression, and falls . Referrals and appointments  In addition, I have reviewed and discussed with patient certain preventive protocols, quality metrics, and best practice recommendations. A written personalized care plan for preventive services as well as general preventive health recommendations were provided to patient.     Barb Merino, LPN   07/16/1599   Nurse Notes:

## 2020-05-04 NOTE — Progress Notes (Signed)
Subjective:    Patient ID: Lindsey Jacobs, female    DOB: 16-May-1935, 85 y.o.   MRN: [id number]  Lindsey Jacobs is a 85 y.o. female presenting on 05/04/2020 for Urinary Frequency  Additionally she is scheduled for [facility] today with Barb Merino LPN.  Here with daughter.  HPI   UTI Reports urinary tract symptoms onset about 2 weeks ago with urinary odor, and onset dysuria, lower abdominal pain, urinary frequency. History of prior UTI, similar symptoms. 11/2019 last episode treated w/ Bactrim, limited results. Allergy to PCN / Keflex. She has been on Cipro in past with good results.   Depression screen [facility] 2/9 05/04/2020 03/14/2019 02/03/2019  Decreased Interest 0 0 0  Down, Depressed, Hopeless 0 0 0  PHQ - 2 Score 0 0 0  Altered sleeping - 0 0  Tired, decreased energy - 0 0  Change in appetite - 0 0  Feeling bad or failure about yourself  - 0 0  Trouble concentrating - 0 0  Moving slowly or fidgety/restless - 0 0  Suicidal thoughts - 0 0  PHQ-9 Score - 0 0  Difficult doing work/chores - Not difficult at all Not difficult at all    Social History   Tobacco Use  . Smoking status: Never Smoker  . Smokeless tobacco: Never Used  Vaping Use  . Vaping Use: Never used  Substance Use Topics  . Alcohol use: Never  . Drug use: Never    Review of Systems Per HPI unless specifically indicated above     Objective:    BP (!) 141/72   Pulse (!) 58   Ht 5\' 5"  (1.651 m)   Wt (!) 315 lb (142.9 kg)   LMP  (LMP Unknown)   SpO2 100%   BMI 52.42 kg/m   Wt Readings from Last 3 Encounters:  05/04/20 (!) 315 lb (142.9 kg)  05/04/20 (!) 315 lb (142.9 kg)  11/20/19 (!) 315 lb (142.9 kg)    Physical Exam Vitals and nursing note reviewed.  Constitutional:      General: She is not in acute distress.    Appearance: She is well-developed. She is not diaphoretic.     Comments: Well-appearing, comfortable, cooperative  HENT:     Head: Normocephalic and atraumatic.  Eyes:      General:        Right eye: No discharge.        Left eye: No discharge.     Conjunctiva/sclera: Conjunctivae normal.  Cardiovascular:     Rate and Rhythm: Normal rate.  Pulmonary:     Effort: Pulmonary effort is normal.  Skin:    General: Skin is warm and dry.     Findings: No erythema or rash.  Neurological:     Mental Status: She is alert and oriented to person, place, and time.  Psychiatric:        Behavior: Behavior normal.     Comments: Well groomed, good eye contact, normal speech and thoughts    Results for orders placed or performed in visit on 05/04/20  POCT Urinalysis Dipstick  Result Value Ref Range   Color, UA Yellow    Clarity, UA Cloudy    Glucose, UA Negative Negative   Bilirubin, UA Negative    Ketones, UA Negative    Spec Grav, UA 1.010 1.010 - 1.025   Blood, UA Trace    pH, UA 7.0 5.0 - 8.0   Protein, UA Positive (A) Negative   Urobilinogen, UA  0.2 0.2 or 1.0 E.U./dL   Nitrite, UA Negative    Leukocytes, UA Moderate (2+) (A) Negative   Appearance     Odor        Assessment & Plan:   Problem List Items Addressed This Visit   None   Visit Diagnoses    Acute cystitis with hematuria    -  Primary   Relevant Medications   ciprofloxacin (CIPRO) 500 MG tablet   Urinary frequency       Relevant Orders   POCT Urinalysis Dipstick (Completed)   Urine Culture      Clinically consistent with UTI and confirmed on UA. No recent UTIs or abx courses.  Previously improved on CIpro in past. Last rx Bactrim for prior UTI 11/2019, unsuccessful. She has allergy to Keflex  Plan: 1. UA abnormal 2. Ordered Urine culture - re collect 3. Cipro 500mg  BID x 5 days 4. Improve PO hydration 5. RTC if no improvement 1-2 weeks, red flags given to return sooner   Meds ordered this encounter  Medications  . ciprofloxacin (CIPRO) 500 MG tablet    Sig: Take 1 tablet (500 mg total) by mouth 2 (two) times daily. For 5 days    Dispense:  10 tablet    Refill:  0       Follow up plan: Return if symptoms worsen or fail to improve, for UTI.   , DO [facility] 05/04/2020, 8:57 AM

## 2020-05-04 NOTE — Patient Instructions (Signed)
Ms. Lindsey Jacobs , Thank you for taking time to come for your Medicare Wellness Visit. I appreciate your ongoing commitment to your health goals. Please review the following plan we discussed and let me know if I can assist you in the future.   Screening recommendations/referrals: Colonoscopy: not required Mammogram: completed 10/17/2019 Bone Density: completed 04/08/2015 Recommended yearly ophthalmology/optometry visit for glaucoma screening and checkup Recommended yearly dental visit for hygiene and checkup  Vaccinations: Influenza vaccine: completed 11/20/2019, due 08/16/2020 Pneumococcal vaccine: completed 10/30/2013 Tdap vaccine: due Shingles vaccine: completed   Covid-19: completed  Advanced directives: Please bring a copy of your POA (Power of [place]) and/or Living Will to your next appointment.   Conditions/risks identified: none  Next appointment: Follow up in one year for your annual wellness visit    Preventive Care 65 Years and Older, Female Preventive care refers to lifestyle choices and visits with your health care provider that can promote health and wellness. What does preventive care include?  A yearly physical exam. This is also called an annual well check.  Dental exams once or twice a year.  Routine eye exams. Ask your health care provider how often you should have your eyes checked.  Personal lifestyle choices, including:  Daily care of your teeth and gums.  Regular physical activity.  Eating a healthy diet.  Avoiding tobacco and drug use.  Limiting alcohol use.  Practicing safe sex.  Taking low-dose aspirin every day.  Taking vitamin and mineral supplements as recommended by your health care provider. What happens during an annual well check? The services and screenings done by your health care provider during your annual well check will depend on your age, overall health, lifestyle risk factors, and family history of disease. Counseling  Your  health care provider may ask you questions about your:  Alcohol use.  Tobacco use.  Drug use.  Emotional well-being.  Home and relationship well-being.  Sexual activity.  Eating habits.  History of falls.  Memory and ability to understand (cognition).  Work and work Astronomer.  Reproductive health. Screening  You may have the following tests or measurements:  Height, weight, and BMI.  Blood pressure.  Lipid and cholesterol levels. These may be checked every 5 years, or more frequently if you are over 92 years old.  Skin check.  Lung cancer screening. You may have this screening every year starting at age 2 if you have a 30-pack-year history of smoking and currently smoke or have quit within the past 15 years.  Fecal occult blood test (FOBT) of the stool. You may have this test every year starting at age 64.  Flexible sigmoidoscopy or colonoscopy. You may have a sigmoidoscopy every 5 years or a colonoscopy every 10 years starting at age 13.  Hepatitis C blood test.  Hepatitis B blood test.  Sexually transmitted disease (STD) testing.  Diabetes screening. This is done by checking your blood sugar (glucose) after you have not eaten for a while (fasting). You may have this done every 1-3 years.  Bone density scan. This is done to screen for osteoporosis. You may have this done starting at age 48.  Mammogram. This may be done every 1-2 years. Talk to your health care provider about how often you should have regular mammograms. Talk with your health care provider about your test results, treatment options, and if necessary, the need for more tests. Vaccines  Your health care provider may recommend certain vaccines, such as:  Influenza vaccine. This is recommended every  year.  Tetanus, diphtheria, and acellular pertussis (Tdap, Td) vaccine. You may need a Td booster every 10 years.  Zoster vaccine. You may need this after age 20.  Pneumococcal 13-valent  conjugate (PCV13) vaccine. One dose is recommended after age 63.  Pneumococcal polysaccharide (PPSV23) vaccine. One dose is recommended after age 62. Talk to your health care provider about which screenings and vaccines you need and how often you need them. This information is not intended to replace advice given to you by your health care provider. Make sure you discuss any questions you have with your health care provider. Document Released: 01/29/2015 Document Revised: 09/22/2015 Document Reviewed: 11/03/2014 Elsevier Interactive Patient Education  2017 [place] Prevention in the Home Falls can cause injuries. They can happen to people of all ages. There are many things you can do to make your home safe and to help prevent falls. What can I do on the outside of my home?  Regularly fix the edges of walkways and driveways and fix any cracks.  Remove anything that might make you trip as you walk through a door, such as a raised step or threshold.  Trim any bushes or trees on the path to your home.  Use bright outdoor lighting.  Clear any walking paths of anything that might make someone trip, such as rocks or tools.  Regularly check to see if handrails are loose or broken. Make sure that both sides of any steps have handrails.  Any raised decks and porches should have guardrails on the edges.  Have any leaves, snow, or ice cleared regularly.  Use sand or salt on walking paths during winter.  Clean up any spills in your garage right away. This includes oil or grease spills. What can I do in the bathroom?  Use night lights.  Install grab bars by the toilet and in the tub and shower. Do not use towel bars as grab bars.  Use non-skid mats or decals in the tub or shower.  If you need to sit down in the shower, use a plastic, non-slip stool.  Keep the floor dry. Clean up any water that spills on the floor as soon as it happens.  Remove soap buildup in the tub or  shower regularly.  Attach bath mats securely with double-sided non-slip rug tape.  Do not have throw rugs and other things on the floor that can make you trip. What can I do in the bedroom?  Use night lights.  Make sure that you have a light by your bed that is easy to reach.  Do not use any sheets or blankets that are too big for your bed. They should not hang down onto the floor.  Have a firm chair that has side arms. You can use this for support while you get dressed.  Do not have throw rugs and other things on the floor that can make you trip. What can I do in the kitchen?  Clean up any spills right away.  Avoid walking on wet floors.  Keep items that you use a lot in easy-to-reach places.  If you need to reach something above you, use a strong step stool that has a grab bar.  Keep electrical cords out of the way.  Do not use floor polish or wax that makes floors slippery. If you must use wax, use non-skid floor wax.  Do not have throw rugs and other things on the floor that can make you trip. What can  I do with my stairs?  Do not leave any items on the stairs.  Make sure that there are handrails on both sides of the stairs and use them. Fix handrails that are broken or loose. Make sure that handrails are as long as the stairways.  Check any carpeting to make sure that it is firmly attached to the stairs. Fix any carpet that is loose or worn.  Avoid having throw rugs at the top or bottom of the stairs. If you do have throw rugs, attach them to the floor with carpet tape.  Make sure that you have a light switch at the top of the stairs and the bottom of the stairs. If you do not have them, ask someone to add them for you. What else can I do to help prevent falls?  Wear shoes that:  Do not have high heels.  Have rubber bottoms.  Are comfortable and fit you well.  Are closed at the toe. Do not wear sandals.  If you use a stepladder:  Make sure that it is fully  opened. Do not climb a closed stepladder.  Make sure that both sides of the stepladder are locked into place.  Ask someone to hold it for you, if possible.  Clearly mark and make sure that you can see:  Any grab bars or handrails.  First and last steps.  Where the edge of each step is.  Use tools that help you move around (mobility aids) if they are needed. These include:  Canes.  Walkers.  Scooters.  Crutches.  Turn on the lights when you go into a dark area. Replace any light bulbs as soon as they burn out.  Set up your furniture so you have a clear path. Avoid moving your furniture around.  If any of your floors are uneven, fix them.  If there are any pets around you, be aware of where they are.  Review your medicines with your doctor. Some medicines can make you feel dizzy. This can increase your chance of falling. Ask your doctor what other things that you can do to help prevent falls. This information is not intended to replace advice given to you by your health care provider. Make sure you discuss any questions you have with your health care provider. Document Released: 10/29/2008 Document Revised: 06/10/2015 Document Reviewed: 02/06/2014 Elsevier Interactive Patient Education  2017 [employer].

## 2020-05-04 NOTE — Patient Instructions (Addendum)
Thank you for coming to the office today.  1. You have a Urinary Tract Infection - this is very common, your symptoms are reassuring and you should get better within 1 week on the antibiotics - Start Cipro 500mg  2 times daily for next 5 days, complete entire course, even if feeling better - We sent urine for a culture, we will call you within next few days if we need to change antibiotics - Please drink plenty of fluids, improve hydration over next 1 week  If symptoms worsening, developing nausea / vomiting, worsening back pain, fevers / chills / sweats, then please return for re-evaluation sooner.  If you take AZO OTC - limit this to 2-3 days MAX to avoid affecting kidneys  D-Mannose is a natural supplement that can actually help bind to urinary bacteria and reduce their effectiveness it can help prevent UTI from forming, and may reduce some symptoms. It likely cannot cure an active UTI but it is worth a try and good to prevent them with. Try 500mg  twice a day at a full dose if you want, or check package instructions for more info  -------------------------------------------------------------  Consider 2nd opinion  If want to return and ask about Ankylosing Spondylitis treatments , MD  [address]  [place], [address] [place]  [phone] (Work)   OR can do 2nd opinion other location in [place] or [place] / [place].   - Upcoming visit goal to meet new provider [place] NP   Please schedule a Follow-up Appointment to: Return if symptoms worsen or fail to improve, for UTI.  If you have any other questions or concerns, please feel free to call the office or send a message through MyChart. You may also schedule an earlier appointment if necessary.  Additionally, you may be receiving a survey about your experience at our office within a few days to 1 week by e-mail or mail. We value your feedback.  [place], DO [facility], Saralyn Pilar

## 2020-05-06 LAB — URINE CULTURE
MICRO NUMBER:: 11787589
SPECIMEN QUALITY:: ADEQUATE

## 2020-05-17 ENCOUNTER — Other Ambulatory Visit: Payer: Self-pay | Admitting: Unknown Physician Specialty

## 2020-05-17 DIAGNOSIS — F324 Major depressive disorder, single episode, in partial remission: Secondary | ICD-10-CM

## 2020-05-31 ENCOUNTER — Ambulatory Visit: Payer: Self-pay | Admitting: *Deleted

## 2020-05-31 NOTE — Telephone Encounter (Signed)
There are appts available before 5/23, I don't know who's schedule she was looking at. Please call pt to see if she wants to be seen sooner.

## 2020-05-31 NOTE — Telephone Encounter (Signed)
FYI  Pt has an appt 06/07/20.  KP

## 2020-05-31 NOTE — Telephone Encounter (Signed)
I returned pt's call.   She has a rectocele that she has been dealing with for over 30 years.   She was told during her last colonoscopy she has internal hemorrhoids too.   Over the last couple of weeks she has noticed the opening to her rectum seems to be more narrow and like it's trying to close up.    She keeps her stools soft by taking magnesium on a daily basis and using Preparation H for the hemorrhoids which gives her relief. She is concerned that the opening has become very narrow and it's becoming more difficult to pass BMs.   No bleeding.  The Preparation H resolves the itching, pain and swelling when the hemorrhoids act up. "I think it's time I had this checked before I get into a mess and can't go to the bathroom".  She has an appt next Monday morning with Nicki Reaper, NP-C for this issue.   Per the protocol she needs to be seen within 2 weeks.   There are no appts available before then.  I instructed her to call us back or go to an urgent care of ED if she got in a situation where she could not pass her BM.   "Oh I will call and let someone know".  I sent my notes to [facility] for [facility], NP-C to have for her information prior to seeing pt. Next Monday.  Reason for Disposition . [1] Recurrent episodes of unexplained rectal pain AND [2] NO rectal symptoms now    Rectocele  Answer Assessment - Initial Assessment Questions 1. SYMPTOM:  "What's the main symptom you're concerned about?" (e.g., pain, itching, swelling, rash)     For couple of wks now.   I had a rectocele for many yrs.   I keep my stools soft and regular.   I think I had hemorrhoids.  I used Preparation H which helped because I had a hard time having a BM. There is a very small place where my rectum allows the stool to come out.   I use magnesium.     It takes too much for me to have an elimination.      It's been over 30 yrs I've dealt with this. 2. ONSET: "When did the opening get real small   start?"     Over the last 10 days or so.  3. RECTAL PAIN: "Do you have any pain around your rectum?" "How bad is the pain?"  (Scale 0-10; or mild, moderate, severe)   - NONE (0): no pain   - MILD (1-3): doesn't interfere with normal activities    - MODERATE (4-7): interferes with normal activities or awakens from sleep, limping    - SEVERE (8-10): excruciating pain, unable to have a bowel movement      I had some pain with my BM until I started using the Preparation H.  It helped a lot. 4. RECTAL ITCHING: "Do you have any itching in this area?" "How bad is the itching?"  (Scale 0-10; or mild, moderate, severe)   - NONE: no itching   - MILD: doesn't interfere with normal activities    - MODERATE-SEVERE: interferes with normal activities or awakens from sleep     No itching now but I did before using the Preparation H. 5. CONSTIPATION: "Do you have constipation?" If Yes, ask: "How bad is it?"     No  I use magnesium daily 6. CAUSE: "What do you think  is causing the anus symptoms?"     I have a rectocele that I've had for over 30 yrs.  7. OTHER SYMPTOMS: "Do you have any other symptoms?"  (e.g., abdomen pain, fever, rectal bleeding, vomiting)     The preparation H has helped with the pain and itching.   8. PREGNANCY: "Is there any chance you are pregnant?" "When was your last menstrual period?"     N/A due to age  Protocols used: RECTAL [facility]

## 2020-06-02 ENCOUNTER — Ambulatory Visit (INDEPENDENT_AMBULATORY_CARE_PROVIDER_SITE_OTHER): Payer: Medicare Other | Admitting: Internal Medicine

## 2020-06-02 ENCOUNTER — Other Ambulatory Visit: Payer: Self-pay

## 2020-06-02 ENCOUNTER — Encounter: Payer: Self-pay | Admitting: Internal Medicine

## 2020-06-02 VITALS — BP 139/65 | HR 57 | Temp 97.3°F | Resp 22 | Ht 65.0 in | Wt 315.0 lb

## 2020-06-02 DIAGNOSIS — Z6841 Body Mass Index (BMI) 40.0 and over, adult: Secondary | ICD-10-CM | POA: Diagnosis not present

## 2020-06-02 DIAGNOSIS — K6289 Other specified diseases of anus and rectum: Secondary | ICD-10-CM | POA: Diagnosis not present

## 2020-06-02 NOTE — Patient Instructions (Signed)
Proctalgia Fugax  Proctalgia fugax is a condition that involves short episodes of intense pain in the rectum. The rectum is the last part of the large intestine. The pain can last from seconds to minutes. Episodes often occur during the night and wake the person from sleep. This condition is not a sign of cancer, but your health care provider may want to rule out other conditions. What are the causes? The exact cause of this condition is not known. One possible cause may be spasm of the pelvic muscles or spasms of the lowest part of the large intestine. What are the signs or symptoms? The only symptom of this condition is rectal pain. The pain may:  Be intense or severe.  Last for only a few seconds, or it may last up to 30 minutes.  Occur at night and wake you up from sleep. How is this diagnosed? This condition may be diagnosed based on your medical history, a physical exam, and by ruling out other problems that could cause the pain. You may have various tests, such as:  Anoscopy. In this test, a lighted scope is put into the rectum to look for abnormalities.  Barium enema. In this test, X-rays are taken after a white, chalky substance called barium is put into the colon. The barium shows up well on the X-rays and makes it easier to see any problems.  Blood tests to rule out infections or other problems. How is this treated? There is no specific treatment for this condition. This condition may be managed with:  Medicines.  Warm baths.  Relaxation techniques, such as deep breathing exercises or gentle yoga.  Gentle massage of the painful area.  Biofeedback. Follow these instructions at home:  Take over-the-counter and prescription medicines only as told by your health care provider.  Follow instructions from your health care provider about eating or drinking restrictions.  Ask your health care provider what activities are safe for you.  Try warm baths, massaging the area, or  progressive relaxation techniques as told by your health care provider.  Keep all follow-up visits as told by your health care provider. This is important.   Contact a health care provider if:  Your pain gets worse.  You develop new symptoms.  You have anal pain along with a fever. Get help right away if you:  Have blood in your stool or blood coming from the rectal area. Summary  Proctalgia fugax is a condition that involves short episodes of intense pain in the rectum. Episodes often occur during the night and wake the person from sleep.  The cause of this condition is not known.  Medicines, warm baths, massage, relaxation techniques, and biofeedback may help to manage the pain.  Get help right away if you have blood in your stool or blood coming from the rectal area. This information is not intended to replace advice given to you by your health care provider. Make sure you discuss any questions you have with your health care provider. Document Revised: 08/15/2017 Document Reviewed: 08/15/2017 Elsevier Patient Education  2021 [employer].

## 2020-06-02 NOTE — Progress Notes (Signed)
Subjective:    Patient ID: Lindsey Jacobs, female    DOB: 09-Jan-1936, 85 y.o.   MRN: [id number]  HPI  Patient presents the clinic today with complaint of difficulty passing gas and stool. She reports this occurred earlier in the week, but symptoms have now resolved. She reports mild rectal pain but no itching or bleeding. She has been taking Magnesium OTC and she took Preperation H OTC with complete resolution of symptoms. She has a history of rectocele but no noted hemorrhoids. She denies current constipation, diarrhea, abdominal pain, nausea or vomiting. There is no colonoscopy on file.  Review of Systems  Past Medical History:  Diagnosis Date  . Allergy   . Colon polyps   . Depression   . GERD (gastroesophageal reflux disease)   . Glaucoma   . Hyperlipidemia   . Hypertension   . Sleep apnea   . Urinary incontinence     Current Outpatient Medications  Medication Sig Dispense Refill  . Carboxymeth-Glyc-Polysorb PF (REFRESH DIGITAL PF) 0.5-1-0.5 % SOLN Apply to eye.    . Cholecalciferol (VITAMIN D3 MAXIMUM STRENGTH PO) Take by mouth.    . ciprofloxacin (CIPRO) 500 MG tablet Take 1 tablet (500 mg total) by mouth 2 (two) times daily. For 5 days 10 tablet 0  . diclofenac sodium (VOLTAREN) 1 % GEL Apply 4 g topically 4 (four) times daily. 200 g 5  . dorzolamide (TRUSOPT) 2 % ophthalmic solution 1 drop 3 (three) times daily. (Patient not taking: Reported on 05/04/2020)    . dorzolamide-timolol (COSOPT) 22.3-6.8 MG/ML ophthalmic solution     . DULoxetine (CYMBALTA) 60 MG capsule TAKE 1 CAPSULE DAILY (NEED APPOINTMENT IN OCTOBER) 90 capsule 0  . latanoprost (XALATAN) 0.005 % ophthalmic solution 1 drop at bedtime.    Marland Kitchen losartan (COZAAR) 50 MG tablet Take 0.5 tablets (25 mg total) by mouth daily. (Patient not taking: Reported on 05/04/2020) 90 tablet 1  . Magnesium Oxide 500 MG TABS Take by mouth. Patient is taking 2 tablet once daily.    . meclizine (ANTIVERT) 12.5 MG tablet Take 1 tablet  (12.5 mg total) by mouth 3 (three) times daily as needed for dizziness. 90 tablet 1  . Multiple Vitamins-Minerals (ADVANCED EYE HEALTH PO) Take by mouth.    . naproxen (NAPROSYN) 500 MG tablet Take 1 tablet (500 mg total) by mouth daily as needed for moderate pain. Take with meal 90 tablet 1  . pantoprazole (PROTONIX) 40 MG tablet TAKE 1 TABLET DAILY 90 tablet 3  . triamterene-hydrochlorothiazide (MAXZIDE-25) 37.5-25 MG tablet TAKE 1 TABLET DAILY (DOSE CHANGED FROM 75-25. NEED OFFICE VISIT) 90 tablet 1   No current facility-administered medications for this visit.    Allergies  Allergen Reactions  . Amoxapine And Related     Hives  . Diamox [Acetazolamide]   . Keflex [Cephalexin] Rash  . Tape Rash    Family History  Problem Relation Age of Onset  . Heart disease Father   . Colon polyps Father   . Alcohol abuse Father     Social History   Socioeconomic History  . Marital status: Widowed    Spouse name: Not on file  . Number of children: Not on file  . Years of education: Not on file  . Highest education level: Not on file  Occupational History  . Occupation: retired  Tobacco Use  . Smoking status: Never Smoker  . Smokeless tobacco: Never Used  Vaping Use  . Vaping Use: Never used  Substance and Sexual  Activity  . Alcohol use: Never  . Drug use: Never  . Sexual activity: Not Currently  Other Topics Concern  . Not on file  Social History Narrative  . Not on file   Social Determinants of Health   Financial Resource Strain: Low Risk   . Difficulty of Paying Living Expenses: Not hard at all  Food Insecurity: No Food Insecurity  . Worried About Programme researcher, broadcasting/film/video in the Last Year: Never true  . Ran Out of Food in the Last Year: Never true  Transportation Needs: No Transportation Needs  . Lack of Transportation (Medical): No  . Lack of Transportation (Non-Medical): No  Physical Activity: Inactive  . Days of Exercise per Week: 0 days  . Minutes of Exercise per  Session: 0 min  Stress: No Stress Concern Present  . Feeling of Stress : Not at all  Social Connections: Not on file  Intimate Partner Violence: Not on file     Constitutional: Denies fever, malaise, fatigue, headache or abrupt weight changes.  Respiratory: Denies difficulty breathing, shortness of breath, cough or sputum production.   Cardiovascular: Denies chest pain, chest tightness, palpitations or swelling in the hands or feet.  Gastrointestinal: Pt reports rectal pain, now resolved. Denies abdominal pain, bloating, constipation, diarrhea or blood in the stool.  GU: Pt reports urinary incontinence. Denies urgency, frequency, pain with urination, burning sensation, blood in urine, odor or discharge.  No other specific complaints in a complete review of systems (except as listed in HPI above).      Objective:   Physical Exam  BP 139/65 (BP Location: Left Arm, Patient Position: Sitting, Cuff Size: Large)   Pulse (!) 57   Temp (!) 97.3 F (36.3 C) (Temporal)   Resp (!) 22   Ht 5\' 5"  (1.651 m)   Wt (!) 315 lb (142.9 kg)   LMP  (LMP Unknown)   SpO2 100%   BMI 52.42 kg/m   Wt Readings from Last 3 Encounters:  05/04/20 (!) 315 lb (142.9 kg)  05/04/20 (!) 315 lb (142.9 kg)  11/20/19 (!) 315 lb (142.9 kg)    General: Appears her stated age, obese, in NAD. HEENT: Head: normal shape and size; Eyes: sclera white and EOMs intact; Cardiovascular: Bradycardic with normal rhythm. S1,S2 noted.  No murmur, rubs or gallops noted.  Pulmonary/Chest: Normal effort and positive vesicular breath sounds. No respiratory distress. No wheezes, rales or ronchi noted.  Abdomen: Soft and nontender. Normal bowel sounds. No distention or masses noted.  Rectal: No external hemorrhoid or fissure noted. Normal rectal tone. No fecal impaction noted. Musculoskeletal: Wheelchair bound.  Neurological: Alert and oriented.   BMET    Component Value Date/Time   NA 138 06/25/2019 1139   K 4.0 06/25/2019  1139   CL 100 06/25/2019 1139   CO2 28 06/25/2019 1139   GLUCOSE 99 06/25/2019 1139   BUN 16 06/25/2019 1139   CREATININE 0.97 (H) 06/25/2019 1139   CALCIUM 10.2 06/25/2019 1139   GFRNONAA 54 (L) 06/25/2019 1139   GFRAA 63 06/25/2019 1139    Lipid Panel     Component Value Date/Time   CHOL 181 05/14/2019 0945   TRIG 83 05/14/2019 0945   HDL 59 05/14/2019 0945   CHOLHDL 3.1 05/14/2019 0945   LDLCALC 105 (H) 05/14/2019 0945    CBC    Component Value Date/Time   WBC 8.0 06/25/2019 1139   RBC 4.96 06/25/2019 1139   HGB 14.0 06/25/2019 1139   HCT 42.4  06/25/2019 1139   PLT 244 06/25/2019 1139   MCV 85.5 06/25/2019 1139   MCH 28.2 06/25/2019 1139   MCHC 33.0 06/25/2019 1139   RDW 14.8 06/25/2019 1139   LYMPHSABS 1,592 06/25/2019 1139   EOSABS 520 (H) 06/25/2019 1139   BASOSABS 48 06/25/2019 1139    Hgb A1C Lab Results  Component Value Date   HGBA1C 5.6 11/20/2019          Assessment & Plan:   Rectal Pain:  Exam benign and symptoms have resolved Encouraged high fiber diet, adequate water intake Ok to continue Magnesium and Preparation H OTC as needed No indication for referral to GI or colonoscopy at this time  RTC in 6 months for follow up chronic conditions Nicki Reaper, NP This visit occurred during the SARS-CoV-2 public health emergency.  Safety protocols were in place, including screening questions prior to the visit, additional usage of staff PPE, and extensive cleaning of exam room while observing appropriate contact time as indicated for disinfecting solutions.

## 2020-06-07 ENCOUNTER — Ambulatory Visit: Payer: Medicare Other | Admitting: Internal Medicine

## 2020-06-11 ENCOUNTER — Other Ambulatory Visit: Payer: Self-pay

## 2020-06-11 DIAGNOSIS — K219 Gastro-esophageal reflux disease without esophagitis: Secondary | ICD-10-CM

## 2020-06-11 MED ORDER — PANTOPRAZOLE SODIUM 40 MG PO TBEC: 1.0000 | DELAYED_RELEASE_TABLET | Freq: Every day | ORAL | 3 refills | Status: DC

## 2020-08-04 ENCOUNTER — Other Ambulatory Visit: Payer: Self-pay

## 2020-08-04 DIAGNOSIS — I10 Essential (primary) hypertension: Secondary | ICD-10-CM

## 2020-08-04 MED ORDER — TRIAMTERENE-HCTZ 37.5-25 MG PO TABS: ORAL_TABLET | ORAL | 1 refills | Status: DC

## 2020-08-16 ENCOUNTER — Other Ambulatory Visit: Payer: Self-pay | Admitting: Unknown Physician Specialty

## 2020-08-16 DIAGNOSIS — F324 Major depressive disorder, single episode, in partial remission: Secondary | ICD-10-CM

## 2020-08-16 NOTE — Telephone Encounter (Signed)
Requested medication (s) are due for refill today: Yes  Requested medication (s) are on the active medication list: Yes  Last refill:  05/2020  Future visit scheduled: Yes  Notes to clinic:  Unable to refill per protocol, last refill by another provider.      Requested Prescriptions  Pending Prescriptions Disp Refills   DULoxetine (CYMBALTA) 60 MG capsule [Pharmacy Med Name: DULOXETINE HCL DR CAPS 60MG ] 90 capsule 1    Sig: TAKE 1 CAPSULE DAILY (NEED APPOINTMENT IN OCTOBER)      Psychiatry: Antidepressants - SNRI Passed - 08/16/2020  3:23 AM      Passed - Completed PHQ-2 or PHQ-9 in the last 360 days      Passed - Last BP in normal range    BP Readings from Last 1 Encounters:  06/02/20 139/65          Passed - Valid encounter within last 6 months    Recent Outpatient Visits           2 months ago Rectal pain   [facility] [place], [place], NP   3 months ago Acute cystitis with hematuria   [facility] [place], [place], DO   6 months ago Essential hypertension   [facility], [facility], FNP   9 months ago Essential hypertension   [facility], [facility], FNP   12 months ago Prediabetes   [facility], [facility], FNP       Future Appointments             In 3 months Baity, Jodelle Gross, NP [facility], [facility]   In 8 months  [facility], [facility]

## 2020-09-14 ENCOUNTER — Ambulatory Visit (INDEPENDENT_AMBULATORY_CARE_PROVIDER_SITE_OTHER): Payer: Medicare Other | Admitting: Internal Medicine

## 2020-09-14 ENCOUNTER — Other Ambulatory Visit: Payer: Self-pay

## 2020-09-14 ENCOUNTER — Encounter: Payer: Self-pay | Admitting: Internal Medicine

## 2020-09-14 VITALS — BP 132/71 | HR 57 | Temp 97.5°F | Resp 17 | Ht 65.0 in | Wt 312.0 lb

## 2020-09-14 DIAGNOSIS — N183 Chronic kidney disease, stage 3 unspecified: Secondary | ICD-10-CM | POA: Insufficient documentation

## 2020-09-14 DIAGNOSIS — R8290 Unspecified abnormal findings in urine: Secondary | ICD-10-CM | POA: Diagnosis not present

## 2020-09-14 DIAGNOSIS — Z23 Encounter for immunization: Secondary | ICD-10-CM | POA: Diagnosis not present

## 2020-09-14 DIAGNOSIS — E785 Hyperlipidemia, unspecified: Secondary | ICD-10-CM | POA: Insufficient documentation

## 2020-09-14 LAB — POCT URINALYSIS DIPSTICK
Bilirubin, UA: NEGATIVE
Blood, UA: NEGATIVE
Glucose, UA: NEGATIVE
Ketones, UA: NEGATIVE
Nitrite, UA: NEGATIVE
Protein, UA: POSITIVE — AB
Spec Grav, UA: <=1.005 — AB (ref 1.010–1.025)
Urobilinogen, UA: 0.2 E.U./dL
pH, UA: 6 (ref 5.0–8.0)

## 2020-09-14 MED ORDER — NITROFURANTOIN MONOHYD MACRO 100 MG PO CAPS: 100.0000 mg | ORAL_CAPSULE | Freq: Two times a day (BID) | ORAL | 0 refills | Status: DC

## 2020-09-14 NOTE — Patient Instructions (Signed)

## 2020-09-14 NOTE — Progress Notes (Signed)
HPI  Pt presents to the clinic today with c/o urine odor.  She reports this started 2 week ago. She denies urgency, frequency, dysuria, blood in her urine. She denies abdominal pain, nausea or low back pain. She denies vaginal discharge itching, odor or abnormal bleeding.  She has been drinking lots of water.  She has not taken anything OTC for her symptoms.    Review of Systems  Past Medical History:  Diagnosis Date   Allergy    Colon polyps    Depression    GERD (gastroesophageal reflux disease)    Glaucoma    Hyperlipidemia    Hypertension    Sleep apnea    Urinary incontinence     Family History  Problem Relation Age of Onset   Heart disease Father    Colon polyps Father    Alcohol abuse Father     Social History   Socioeconomic History   Marital status: Widowed    Spouse name: Not on file   Number of children: Not on file   Years of education: Not on file   Highest education level: Not on file  Occupational History   Occupation: retired  Tobacco Use   Smoking status: Never   Smokeless tobacco: Never  Vaping Use   Vaping Use: Never used  Substance and Sexual Activity   Alcohol use: Never   Drug use: Never   Sexual activity: Not Currently  Other Topics Concern   Not on file  Social History Narrative   Not on file   Social Determinants of Health   Financial Resource Strain: Low Risk    Difficulty of Paying Living Expenses: Not hard at all  Food Insecurity: No Food Insecurity   Worried About Programme researcher, broadcasting/film/video in the Last Year: Never true   Ran Out of Food in the Last Year: Never true  Transportation Needs: No Transportation Needs   Lack of Transportation (Medical): No   Lack of Transportation (Non-Medical): No  Physical Activity: Inactive   Days of Exercise per Week: 0 days   Minutes of Exercise per Session: 0 min  Stress: No Stress Concern Present   Feeling of Stress : Not at all  Social Connections: Not on file  Intimate Partner Violence: Not  on file    Allergies  Allergen Reactions   Amoxapine And Related     Hives   Diamox [Acetazolamide]    Keflex [Cephalexin] Rash   Tape Rash     Constitutional: Denies fever, malaise, fatigue, headache or abrupt weight changes.   GU: Pt reports urine odor.  Denies urgency, frequency, dysuria, burning sensation, blood in urine, or discharge. Skin: Denies redness, rashes, lesions or ulcercations.   No other specific complaints in a complete review of systems (except as listed in HPI above).    Objective:   Physical Exam  BP 132/71 (BP Location: Right Arm, Patient Position: Sitting, Cuff Size: Large)   Pulse (!) 57   Temp (!) 97.5 F (36.4 C) (Temporal)   Resp 17   Ht 5\' 5"  (1.651 m)   Wt (!) 312 lb (141.5 kg)   LMP  (LMP Unknown)   SpO2 100%   BMI 51.92 kg/m   Wt Readings from Last 3 Encounters:  06/02/20 (!) 315 lb (142.9 kg)  05/04/20 (!) 315 lb (142.9 kg)  05/04/20 (!) 315 lb (142.9 kg)    General: Appears her stated age, obese, in NAD. Cardiovascular: Bradycardic with normal rhythm.   Pulmonary/Chest: Normal effort and  positive vesicular breath sounds. No respiratory distress. No wheezes, rales or ronchi noted.  Abdomen: Soft and nontender over the bladder. Normal bowel sounds. No distention or masses noted.  No CVA tenderness.        Assessment & Plan:   Urine odor:  Urinalysis: 3+ leuks Will send urine culture eRx sent if for Macrobid 100 mg BID x 5 days OK to take AZO OTC Drink plenty of fluids  RTC as needed or if symptoms persist. Lindsey Reaper, NP This visit occurred during the SARS-CoV-2 public health emergency.  Safety protocols were in place, including screening questions prior to the visit, additional usage of staff PPE, and extensive cleaning of exam room while observing appropriate contact time as indicated for disinfecting solutions.

## 2020-09-16 ENCOUNTER — Other Ambulatory Visit: Payer: Self-pay

## 2020-09-16 DIAGNOSIS — M8949 Other hypertrophic osteoarthropathy, multiple sites: Secondary | ICD-10-CM

## 2020-09-16 DIAGNOSIS — M159 Polyosteoarthritis, unspecified: Secondary | ICD-10-CM

## 2020-09-16 LAB — URINE CULTURE
MICRO NUMBER:: 12312477
Result:: NO GROWTH
SPECIMEN QUALITY:: ADEQUATE

## 2020-10-21 DIAGNOSIS — R200 Anesthesia of skin: Secondary | ICD-10-CM | POA: Insufficient documentation

## 2020-11-24 ENCOUNTER — Other Ambulatory Visit: Payer: Self-pay | Admitting: Internal Medicine

## 2020-11-24 DIAGNOSIS — Z1231 Encounter for screening mammogram for malignant neoplasm of breast: Secondary | ICD-10-CM

## 2020-11-29 ENCOUNTER — Other Ambulatory Visit: Payer: Self-pay

## 2020-11-29 ENCOUNTER — Encounter: Payer: Self-pay | Admitting: Internal Medicine

## 2020-11-29 ENCOUNTER — Ambulatory Visit (INDEPENDENT_AMBULATORY_CARE_PROVIDER_SITE_OTHER): Payer: Medicare Other | Admitting: Internal Medicine

## 2020-11-29 VITALS — BP 128/68 | HR 64 | Temp 97.5°F | Resp 18 | Ht 65.0 in | Wt 311.0 lb

## 2020-11-29 DIAGNOSIS — M542 Cervicalgia: Secondary | ICD-10-CM | POA: Diagnosis not present

## 2020-11-29 DIAGNOSIS — R7303 Prediabetes: Secondary | ICD-10-CM

## 2020-11-29 DIAGNOSIS — K219 Gastro-esophageal reflux disease without esophagitis: Secondary | ICD-10-CM

## 2020-11-29 DIAGNOSIS — R42 Dizziness and giddiness: Secondary | ICD-10-CM

## 2020-11-29 DIAGNOSIS — M19011 Primary osteoarthritis, right shoulder: Secondary | ICD-10-CM

## 2020-11-29 DIAGNOSIS — H3530 Unspecified macular degeneration: Secondary | ICD-10-CM

## 2020-11-29 DIAGNOSIS — G4733 Obstructive sleep apnea (adult) (pediatric): Secondary | ICD-10-CM

## 2020-11-29 DIAGNOSIS — E7800 Pure hypercholesterolemia, unspecified: Secondary | ICD-10-CM | POA: Diagnosis not present

## 2020-11-29 DIAGNOSIS — N1831 Chronic kidney disease, stage 3a: Secondary | ICD-10-CM

## 2020-11-29 DIAGNOSIS — I10 Essential (primary) hypertension: Secondary | ICD-10-CM | POA: Diagnosis not present

## 2020-11-29 DIAGNOSIS — F325 Major depressive disorder, single episode, in full remission: Secondary | ICD-10-CM

## 2020-11-29 DIAGNOSIS — M19012 Primary osteoarthritis, left shoulder: Secondary | ICD-10-CM

## 2020-11-29 DIAGNOSIS — L309 Dermatitis, unspecified: Secondary | ICD-10-CM | POA: Insufficient documentation

## 2020-11-29 DIAGNOSIS — L2082 Flexural eczema: Secondary | ICD-10-CM

## 2020-11-29 DIAGNOSIS — G8929 Other chronic pain: Secondary | ICD-10-CM

## 2020-11-29 MED ORDER — TRIAMCINOLONE ACETONIDE 0.1 % EX CREA: 1.0000 "application " | TOPICAL_CREAM | Freq: Two times a day (BID) | CUTANEOUS | 0 refills | Status: DC

## 2020-11-29 NOTE — Assessment & Plan Note (Signed)
Stable on her current dose of Duloxetine ?Support offered ?

## 2020-11-29 NOTE — Assessment & Plan Note (Signed)
Encouraged diet and exercise for weight loss ?

## 2020-11-29 NOTE — Assessment & Plan Note (Signed)
Continue Meclizine as needed 

## 2020-11-29 NOTE — Assessment & Plan Note (Signed)
Controlled on Triamterene HCT Reinforced DASH diet and weight loss CMET today

## 2020-11-29 NOTE — Assessment & Plan Note (Signed)
CMET today Encouraged adequate fluid intake

## 2020-11-29 NOTE — Assessment & Plan Note (Signed)
Encouraged weight loss as this can help reduce joint pain Continue Duoxetine and Naproxen as prescribed

## 2020-11-29 NOTE — Assessment & Plan Note (Signed)
Will continue to follow with opthalomology

## 2020-11-29 NOTE — Assessment & Plan Note (Signed)
Encouraged weight loss as this can help reduce sleep apnea symptoms Continue CPAP 

## 2020-11-29 NOTE — Patient Instructions (Signed)
Heart-Healthy Eating Plan Heart-healthy meal planning includes: Eating less unhealthy fats. Eating more healthy fats. Making other changes in your diet. Talk with your doctor or a diet specialist (dietitian) to create an eating plan that is right for you. What is my plan? Your doctor may recommend an eating plan that includes: Total fat: ______% or less of total calories a day. Saturated fat: ______% or less of total calories a day. Cholesterol: less than _________mg a day. What are tips for following this plan? Cooking Avoid frying your food. Try to bake, boil, grill, or broil it instead. You can also reduce fat by: Removing the skin from poultry. Removing all visible fats from meats. Steaming vegetables in water or broth. Meal planning  At meals, divide your plate into four equal parts: Fill one-half of your plate with vegetables and green salads. Fill one-fourth of your plate with whole grains. Fill one-fourth of your plate with lean protein foods. Eat 4-5 servings of vegetables per day. A serving of vegetables is: 1 cup of raw or cooked vegetables. 2 cups of raw leafy greens. Eat 4-5 servings of fruit per day. A serving of fruit is: 1 medium whole fruit.  cup of dried fruit.  cup of fresh, frozen, or canned fruit.  cup of 100% fruit juice. Eat more foods that have soluble fiber. These are apples, broccoli, carrots, beans, peas, and barley. Try to get 20-30 g of fiber per day. Eat 4-5 servings of nuts, legumes, and seeds per week: 1 serving of dried beans or legumes equals  cup after being cooked. 1 serving of nuts is  cup. 1 serving of seeds equals 1 tablespoon. General information Eat more home-cooked food. Eat less restaurant, buffet, and fast food. Limit or avoid alcohol. Limit foods that are high in starch and sugar. Avoid fried foods. Lose weight if you are overweight. Keep track of how much salt (sodium) you eat. This is important if you have high blood  pressure. Ask your doctor to tell you more about this. Try to add vegetarian meals each week. Fats Choose healthy fats. These include olive oil and canola oil, flaxseeds, walnuts, almonds, and seeds. Eat more omega-3 fats. These include salmon, mackerel, sardines, tuna, flaxseed oil, and ground flaxseeds. Try to eat fish at least 2 times each week. Check food labels. Avoid foods with trans fats or high amounts of saturated fat. Limit saturated fats. These are often found in animal products, such as meats, butter, and cream. These are also found in plant foods, such as palm oil, palm kernel oil, and coconut oil. Avoid foods with partially hydrogenated oils in them. These have trans fats. Examples are stick margarine, some tub margarines, cookies, crackers, and other baked goods. What foods can I eat? Fruits All fresh, canned (in natural juice), or frozen fruits. Vegetables Fresh or frozen vegetables (raw, steamed, roasted, or grilled). Green salads. Grains Most grains. Choose whole wheat and whole grains most of the time. Rice and pasta, including brown rice and pastas made with whole wheat. Meats and other proteins Lean, well-trimmed beef, veal, pork, and lamb. Chicken and turkey without skin. All fish and shellfish. Wild duck, rabbit, pheasant, and venison. Egg whites or low-cholesterol egg substitutes. Dried beans, peas, lentils, and tofu. Seeds and most nuts. Dairy Low-fat or nonfat cheeses, including ricotta and mozzarella. Skim or 1% milk that is liquid, powdered, or evaporated. Buttermilk that is made with low-fat milk. Nonfat or low-fat yogurt. Fats and oils Non-hydrogenated (trans-free) margarines. Vegetable oils, including   soybean, sesame, sunflower, olive, peanut, safflower, corn, canola, and cottonseed. Salad dressings or mayonnaise made with a vegetable oil. Beverages Mineral water. Coffee and tea. Diet carbonated beverages. Sweets and desserts Sherbet, gelatin, and fruit ice.  Small amounts of dark chocolate. Limit all sweets and desserts. Seasonings and condiments All seasonings and condiments. The items listed above may not be a complete list of foods and drinks you can eat. Contact a dietitian for more options. What foods should I avoid? Fruits Canned fruit in heavy syrup. Fruit in cream or butter sauce. Fried fruit. Limit coconut. Vegetables Vegetables cooked in cheese, cream, or butter sauce. Fried vegetables. Grains Breads that are made with saturated or trans fats, oils, or whole milk. Croissants. Sweet rolls. Donuts. High-fat crackers, such as cheese crackers. Meats and other proteins Fatty meats, such as hot dogs, ribs, sausage, bacon, rib-eye roast or steak. High-fat deli meats, such as salami and bologna. Caviar. Domestic duck and goose. Organ meats, such as liver. Dairy Cream, sour cream, cream cheese, and creamed cottage cheese. Whole-milk cheeses. Whole or 2% milk that is liquid, evaporated, or condensed. Whole buttermilk. Cream sauce or high-fat cheese sauce. Yogurt that is made from whole milk. Fats and oils Meat fat, or shortening. Cocoa butter, hydrogenated oils, palm oil, coconut oil, palm kernel oil. Solid fats and shortenings, including bacon fat, salt pork, lard, and butter. Nondairy cream substitutes. Salad dressings with cheese or sour cream. Beverages Regular sodas and juice drinks with added sugar. Sweets and desserts Frosting. Pudding. Cookies. Cakes. Pies. Milk chocolate or white chocolate. Buttered syrups. Full-fat ice cream or ice cream drinks. The items listed above may not be a complete list of foods and drinks to avoid. Contact a dietitian for more information. Summary Heart-healthy meal planning includes eating less unhealthy fats, eating more healthy fats, and making other changes in your diet. Eat a balanced diet. This includes fruits and vegetables, low-fat or nonfat dairy, lean protein, nuts and legumes, whole grains, and  heart-healthy oils and fats. This information is not intended to replace advice given to you by your health care provider. Make sure you discuss any questions you have with your health care provider. Document Revised: 05/13/2020 Document Reviewed: 05/13/2020 Elsevier Patient Education  2022 [employer]  

## 2020-11-29 NOTE — Progress Notes (Signed)
Subjective:    Patient ID: Lindsey Jacobs, female    DOB: 02-Feb-1935, 85 y.o.   MRN: [id number]  HPI  Pt presents to the clinic today for follow up of her chronic conditions.  HTN: Her BP today is 128/68. She is taking Triamterene HCT as prescribed. There is no ECG on file.  HLD: Her last LDL was 105, triglycerides 83, 04/2019. She is not taking any cholesterol lowering medication at this time. She tries to consume a low fat diet.  CKD: Her last creatinine was .97, GFR 54, 06/2019. She is not on anything for renal protection. She does not follow with nephrology.  Prediabetes: Her last A1C was 5.6, 11/2019. She is not taking any oral diabetic medication at this time. She does not check her sugars.  GERD: She is not sure what triggers this. She denies breakthrough on Pantoprazole. Upper GI from 05/2019 reviewed.  OSA: She averages 11 hours of sleep per night with the use of her CPAP. There is no sleep study on file.  Depression: Chronic, managed on Duloxetine. She is not currently seeing a therapist. She denies SI/HI.  Macular Degeneration: On eye drops, managed by ophthalmology.  OA: Mainly in her ankles, shoulders, back. She takes Duloxetine, Naproxen with good relief of symptoms.  Vertigo: Intermittent. She takes Meclizine as needed with good relief of symptoms.  She also reports a rash of her upper chest and neck. She noticed this 1 month ago. The rash itches. She denies changes in soaps, lotions or detergents. She has tried Cortisone cream OTC with some relief of symptoms.  Review of Systems  Past Medical History:  Diagnosis Date   Allergy    Colon polyps    Depression    GERD (gastroesophageal reflux disease)    Glaucoma    Hyperlipidemia    Hypertension    Sleep apnea    Urinary incontinence     Current Outpatient Medications  Medication Sig Dispense Refill   Carboxymeth-Glyc-Polysorb PF (REFRESH DIGITAL PF) 0.5-1-0.5 % SOLN Apply to eye.     Cholecalciferol (VITAMIN  D3 MAXIMUM STRENGTH PO) Take by mouth.     diclofenac sodium (VOLTAREN) 1 % GEL Apply 4 g topically 4 (four) times daily. 200 g 5   dorzolamide (TRUSOPT) 2 % ophthalmic solution 1 drop 3 (three) times daily.     dorzolamide-timolol (COSOPT) 22.3-6.8 MG/ML ophthalmic solution      DULoxetine (CYMBALTA) 60 MG capsule TAKE 1 CAPSULE DAILY (NEED APPOINTMENT IN OCTOBER) 90 capsule 0   gabapentin (NEURONTIN) 100 MG capsule Take 200 mg by mouth 2 (two) times daily.     latanoprost (XALATAN) 0.005 % ophthalmic solution 1 drop at bedtime.     losartan (COZAAR) 50 MG tablet Take 0.5 tablets (25 mg total) by mouth daily. (Patient not taking: Reported on 09/14/2020) 90 tablet 1   Magnesium Oxide 500 MG TABS Take by mouth. Patient is taking 2 tablet once daily.     meclizine (ANTIVERT) 12.5 MG tablet Take 1 tablet (12.5 mg total) by mouth 3 (three) times daily as needed for dizziness. 90 tablet 1   Multiple Vitamins-Minerals (ADVANCED EYE HEALTH PO) Take by mouth.     naproxen (NAPROSYN) 500 MG tablet Take 1 tablet (500 mg total) by mouth daily as needed for moderate pain. Take with meal 90 tablet 1   nitrofurantoin, macrocrystal-monohydrate, (MACROBID) 100 MG capsule Take 1 capsule (100 mg total) by mouth 2 (two) times daily. 10 capsule 0   pantoprazole (PROTONIX) 40 MG  tablet Take 1 tablet (40 mg total) by mouth daily. 90 tablet 3   triamterene-hydrochlorothiazide (MAXZIDE-25) 37.5-25 MG tablet TAKE 1 TABLET DAILY (DOSE CHANGED FROM 75-25. NEED OFFICE VISIT) 90 tablet 1   No current facility-administered medications for this visit.    Allergies  Allergen Reactions   Amoxapine And Related     Hives   Diamox [Acetazolamide]    Keflex [Cephalexin] Rash   Tape Rash    Family History  Problem Relation Age of Onset   Heart disease Father    Colon polyps Father    Alcohol abuse Father     Social History   Socioeconomic History   Marital status: Widowed    Spouse name: Not on file   Number of  children: Not on file   Years of education: Not on file   Highest education level: Not on file  Occupational History   Occupation: retired  Tobacco Use   Smoking status: Never   Smokeless tobacco: Never  Vaping Use   Vaping Use: Never used  Substance and Sexual Activity   Alcohol use: Never   Drug use: Never   Sexual activity: Not Currently  Other Topics Concern   Not on file  Social History Narrative   Not on file   Social Determinants of Health   Financial Resource Strain: Low Risk    Difficulty of Paying Living Expenses: Not hard at all  Food Insecurity: No Food Insecurity   Worried About Programme researcher, broadcasting/film/video in the Last Year: Never true   Ran Out of Food in the Last Year: Never true  Transportation Needs: No Transportation Needs   Lack of Transportation (Medical): No   Lack of Transportation (Non-Medical): No  Physical Activity: Inactive   Days of Exercise per Week: 0 days   Minutes of Exercise per Session: 0 min  Stress: No Stress Concern Present   Feeling of Stress : Not at all  Social Connections: Not on file  Intimate Partner Violence: Not on file     Constitutional: Denies fever, malaise, fatigue, headache or abrupt weight changes.  HEENT: Denies eye pain, eye redness, ear pain, ringing in the ears, wax buildup, runny nose, nasal congestion, bloody nose, or sore throat. Respiratory: Denies difficulty breathing, shortness of breath, cough or sputum production.   Cardiovascular: Denies chest pain, chest tightness, palpitations or swelling in the hands or feet.  Gastrointestinal: Denies abdominal pain, bloating, constipation, diarrhea or blood in the stool.  GU: Denies urgency, frequency, pain with urination, burning sensation, blood in urine, odor or discharge. Musculoskeletal: Pt reports intermittent joint pain, difficulty with gait. Denies decrease in range of motion, muscle pain or joint swelling.  Skin: Pt reports rash of chest and neck. Denies lesions or  ulcercations.  Neurological: Pt reports intermittent dizziness. Denies difficulty with memory, difficulty with speech or problems with balance and coordination.  Psych: Pt has a history of depression. Denies anxiety, SI/HI.  No other specific complaints in a complete review of systems (except as listed in HPI above).     Objective:   Physical Exam   BP 128/68 (BP Location: Right Arm, Patient Position: Sitting, Cuff Size: Large)   Pulse 64   Temp (!) 97.5 F (36.4 C) (Temporal)   Resp 18   Ht 5\' 5"  (1.651 m)   Wt (!) 311 lb (141.1 kg)   LMP  (LMP Unknown)   SpO2 100%   BMI 51.75 kg/m   Wt Readings from Last 3 Encounters:  09/14/20 09/16/20)  312 lb (141.5 kg)  06/02/20 (!) 315 lb (142.9 kg)  05/04/20 (!) 315 lb (142.9 kg)    General: Appears her stated age, obese, in NAD. Skin: Warm, dry and intact. HEENT: Head: normal shape and size; Eyes: EOMs intact;  Cardiovascular: Normal rate and rhythm. S1,S2 noted.  No murmur, rubs or gallops noted. No JVD or BLE edema. No carotid bruits noted. Pulmonary/Chest: Normal effort and positive vesicular breath sounds. No respiratory distress. No wheezes, rales or ronchi noted.  Abdomen: Soft and nontender. Normal bowel sounds.  Musculoskeletal: In wheelchair. Neurological: Alert and oriented.  Psychiatric: Mood and affect normal. Behavior is normal. Judgment and thought content normal.    BMET    Component Value Date/Time   NA 138 06/25/2019 1139   K 4.0 06/25/2019 1139   CL 100 06/25/2019 1139   CO2 28 06/25/2019 1139   GLUCOSE 99 06/25/2019 1139   BUN 16 06/25/2019 1139   CREATININE 0.97 (H) 06/25/2019 1139   CALCIUM 10.2 06/25/2019 1139   GFRNONAA 54 (L) 06/25/2019 1139   GFRAA 63 06/25/2019 1139    Lipid Panel     Component Value Date/Time   CHOL 181 05/14/2019 0945   TRIG 83 05/14/2019 0945   HDL 59 05/14/2019 0945   CHOLHDL 3.1 05/14/2019 0945   LDLCALC 105 (H) 05/14/2019 0945    CBC    Component Value Date/Time    WBC 8.0 06/25/2019 1139   RBC 4.96 06/25/2019 1139   HGB 14.0 06/25/2019 1139   HCT 42.4 06/25/2019 1139   PLT 244 06/25/2019 1139   MCV 85.5 06/25/2019 1139   MCH 28.2 06/25/2019 1139   MCHC 33.0 06/25/2019 1139   RDW 14.8 06/25/2019 1139   LYMPHSABS 1,592 06/25/2019 1139   EOSABS 520 (H) 06/25/2019 1139   BASOSABS 48 06/25/2019 1139    Hgb A1C Lab Results  Component Value Date   HGBA1C 5.6 11/20/2019           Assessment & Plan:     Nicki Reaper, NP This visit occurred during the SARS-CoV-2 public health emergency.  Safety protocols were in place, including screening questions prior to the visit, additional usage of staff PPE, and extensive cleaning of exam room while observing appropriate contact time as indicated for disinfecting solutions.

## 2020-11-29 NOTE — Assessment & Plan Note (Signed)
Will trial Triamcinolone cream 0.1% bid prn

## 2020-11-29 NOTE — Assessment & Plan Note (Signed)
A1C today Encouraged her to consume a low carb diet and exercise for weight loss 

## 2020-11-29 NOTE — Assessment & Plan Note (Signed)
CMET and lipid profile today Encouraged her to consume a low fat diet 

## 2020-11-29 NOTE — Assessment & Plan Note (Signed)
Encouraged weight loss as this can help reduce reflux symptoms Continue Pantoprazole 

## 2020-11-30 LAB — LIPID PANEL
Cholesterol: 183 mg/dL (ref <200)
HDL: 58 mg/dL (ref >=50)
LDL Cholesterol (Calc): 106 mg/dL (calc) — ABNORMAL HIGH
Non-HDL Cholesterol (Calc): 125 mg/dL (calc) (ref <130)
Total CHOL/HDL Ratio: 3.2 (calc) (ref <5.0)
Triglycerides: 91 mg/dL (ref <150)

## 2020-11-30 LAB — COMPLETE METABOLIC PANEL WITH GFR
AG Ratio: 1.4 (calc) (ref 1.0–2.5)
ALT: 8 U/L (ref 6–29)
AST: 13 U/L (ref 10–35)
Albumin: 3.9 g/dL (ref 3.6–5.1)
Alkaline phosphatase (APISO): 88 U/L (ref 37–153)
BUN/Creatinine Ratio: 16 (calc) (ref 6–22)
BUN: 20 mg/dL (ref 7–25)
CO2: 28 mmol/L (ref 20–32)
Calcium: 9.5 mg/dL (ref 8.6–10.4)
Chloride: 104 mmol/L (ref 98–110)
Creat: 1.22 mg/dL — ABNORMAL HIGH (ref 0.60–0.95)
Globulin: 2.8 g/dL (calc) (ref 1.9–3.7)
Glucose, Bld: 95 mg/dL (ref 65–99)
Potassium: 4.1 mmol/L (ref 3.5–5.3)
Sodium: 141 mmol/L (ref 135–146)
Total Bilirubin: 0.6 mg/dL (ref 0.2–1.2)
Total Protein: 6.7 g/dL (ref 6.1–8.1)
eGFR: 44 mL/min/{1.73_m2} — ABNORMAL LOW (ref >=60)

## 2020-11-30 LAB — HEMOGLOBIN A1C
Hgb A1c MFr Bld: 5.6 % of total Hgb (ref <5.7)
Mean Plasma Glucose: 114 mg/dL
eAG (mmol/L): 6.3 mmol/L

## 2020-12-13 ENCOUNTER — Ambulatory Visit
Admission: RE | Admit: 2020-12-13 | Discharge: 2020-12-13 | Disposition: A | Discharge status: Home / Self Care | Payer: Medicare Other | Source: Ambulatory Visit | Attending: Internal Medicine | Admitting: Internal Medicine

## 2020-12-13 DIAGNOSIS — G8929 Other chronic pain: Secondary | ICD-10-CM | POA: Insufficient documentation

## 2020-12-13 DIAGNOSIS — M542 Cervicalgia: Secondary | ICD-10-CM | POA: Insufficient documentation

## 2020-12-13 IMAGING — MR MR CERVICAL SPINE W/O CM
5 series · 38 of 48 positions shown · non-contrast
Comparison: None.

CLINICAL DATA: Neck pain, chronic. Degenerative changes in x-ray.
Bilateral shoulder pain and numbness progression.

EXAM:
MRI CERVICAL SPINE WITHOUT CONTRAST
TECHNIQUE: Multiplanar, multisequence MR imaging of the cervical spine was
performed. No intravenous contrast was administered.

[Series 5: T2 · sagittal · 3.0mm · 0.62mm/px · 6 of 15 slices shown (1 of 2)]
[im 1/15]
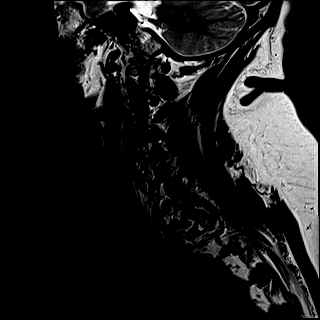
[im 3/15]
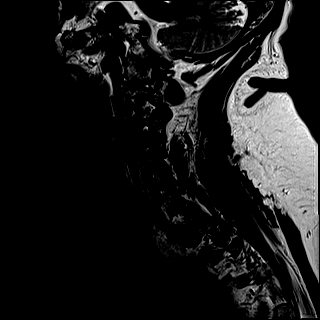
[im 6/15]
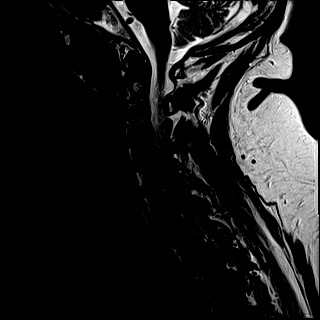
[im 9/15]
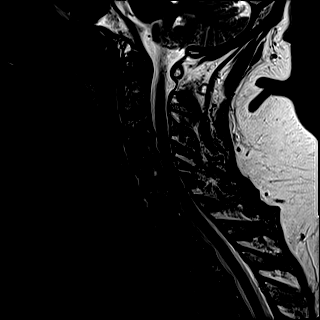
[im 12/15]
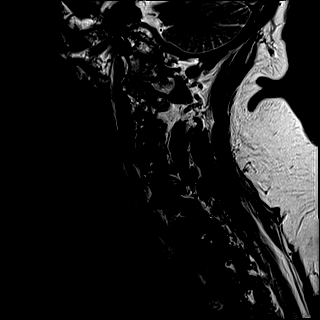
[im 15/15]
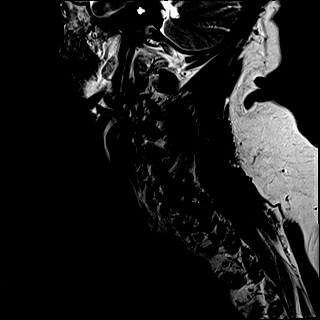

[Series 6: FLAIR · sagittal · 3.0mm · 0.78mm/px · 7 of 15 slices shown]
[im 1/15]
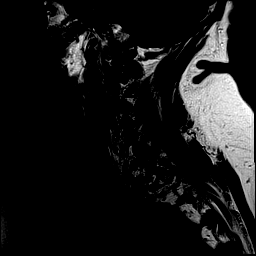
[im 3/15]
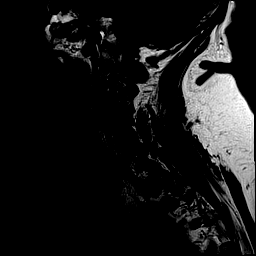
[im 5/15]
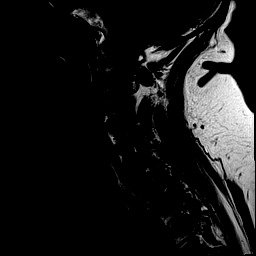
[im 8/15]
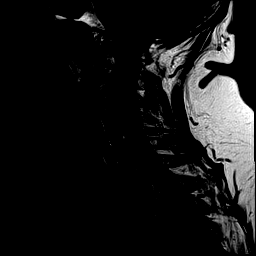
[im 10/15]
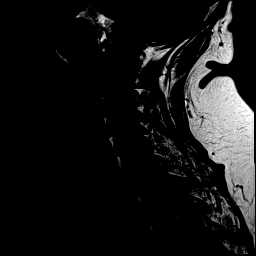
[im 12/15]
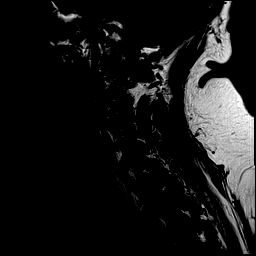
[im 15/15]
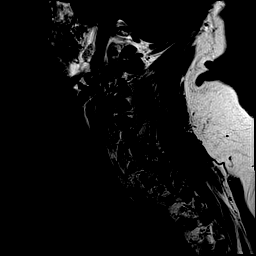

[Series 7: STIR · sagittal · 3.0mm · 0.62mm/px · 7 of 15 slices shown]
[im 1/15]
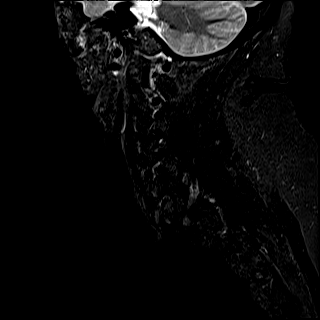
[im 3/15]
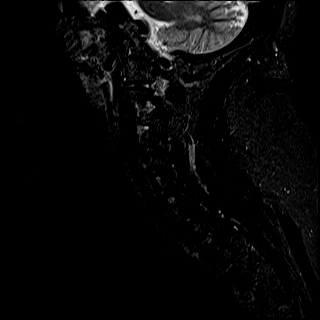
[im 5/15]
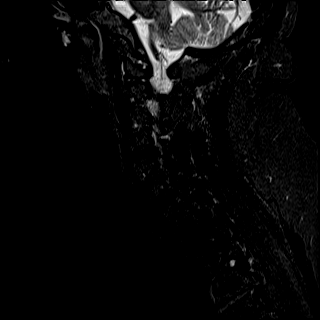
[im 8/15]
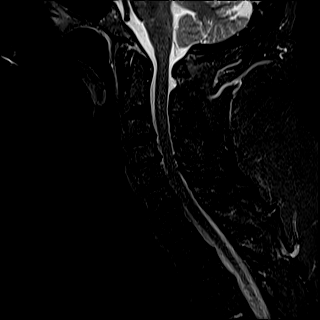
[im 10/15]
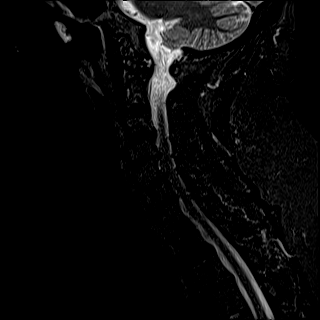
[im 12/15]
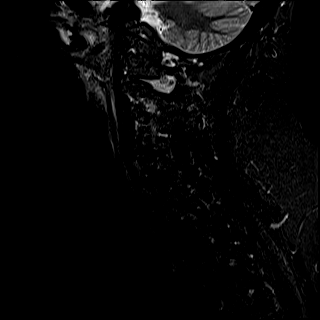
[im 15/15]
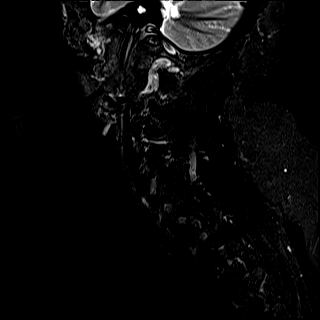

[Series 8: T2 · axial · 3.0mm · 0.70mm/px · z∈[-92,+4]mm · 10 of 29 slices shown (2 of 2)]
[im 1/29]
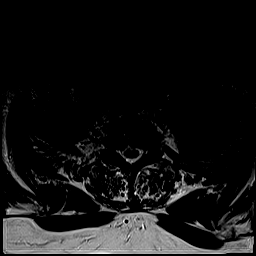
[im 3/29]
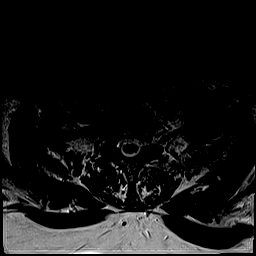
[im 5/29]
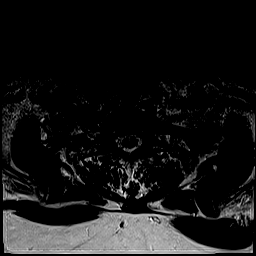
[im 7/29]
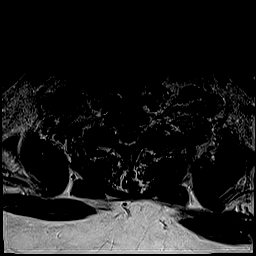
[im 9/29]
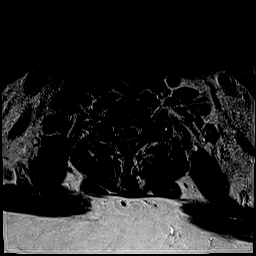
[im 13/29]
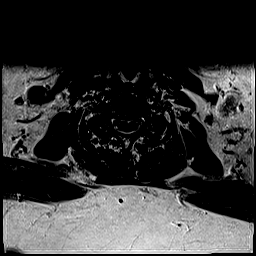
[im 16/29]
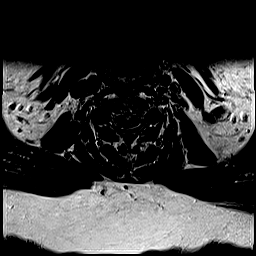
[im 20/29]
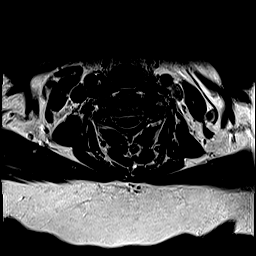
[im 24/29]
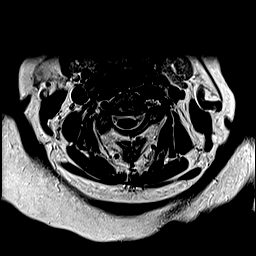
[im 29/29]
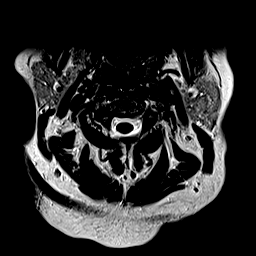

[Series 9: ax mpgr · axial · 3.0mm · 0.35mm/px · z∈[-92,+4]mm · 8 of 29 slices shown]
[im 1/29]
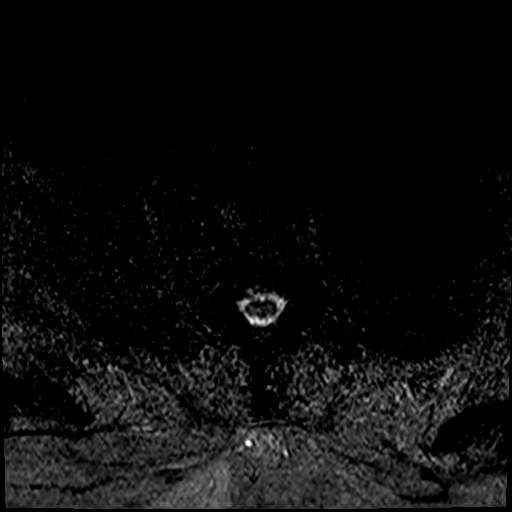
[im 5/29]
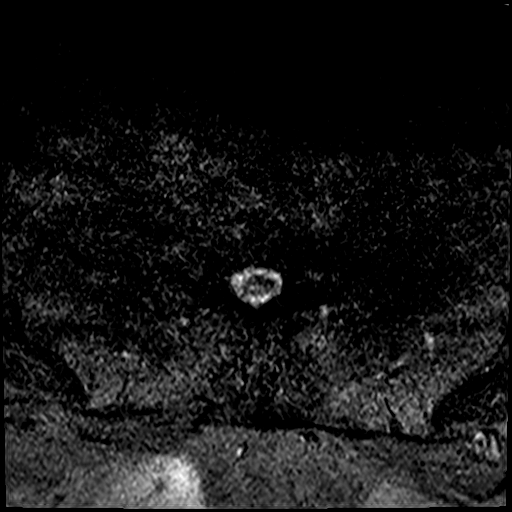
[im 9/29]
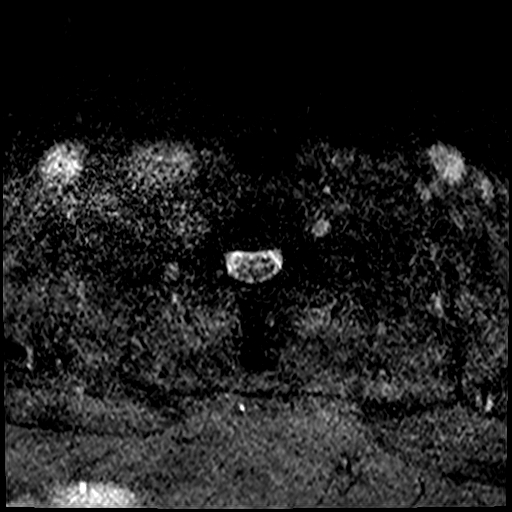
[im 13/29]
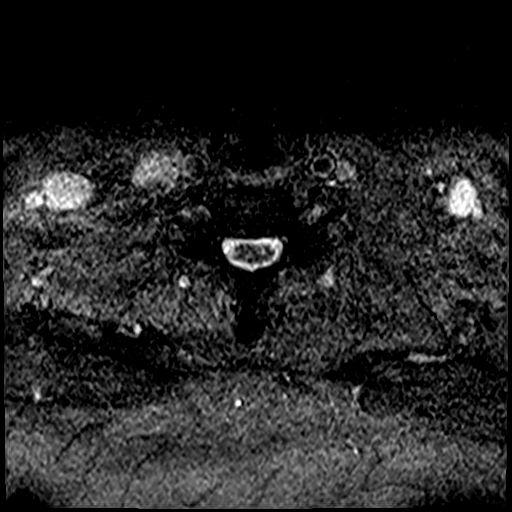
[im 16/29]
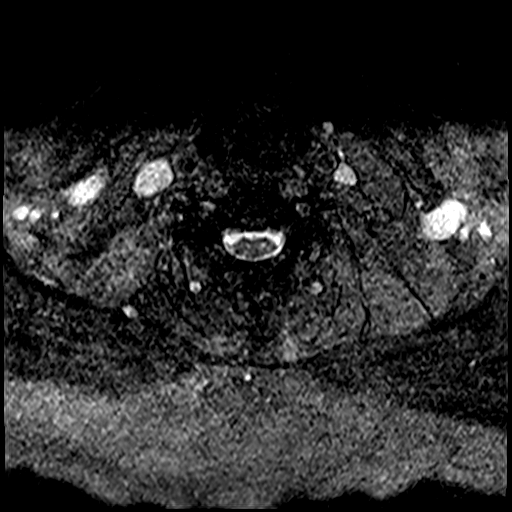
[im 20/29]
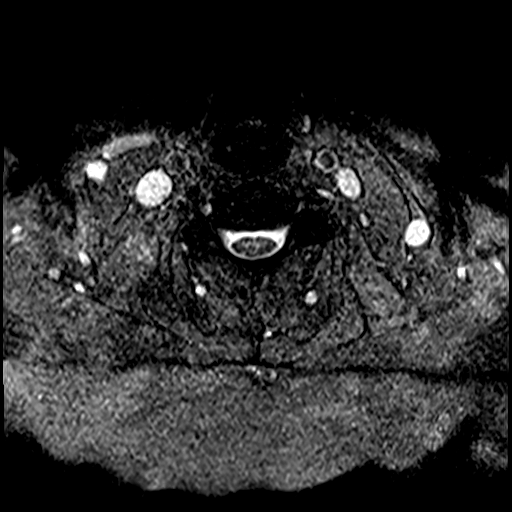
[im 24/29]
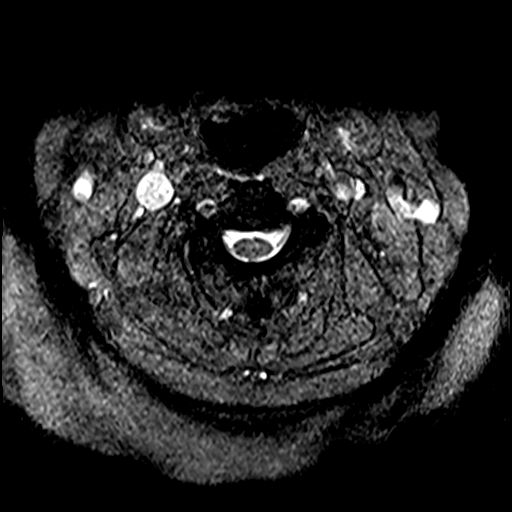
[im 29/29]
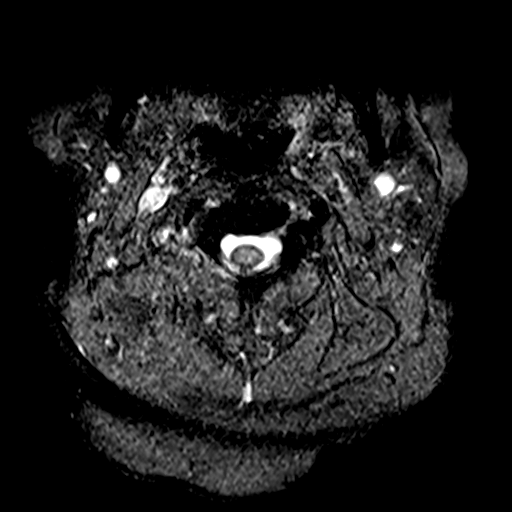

[38 of 48 positions shown; findings below may reference images not displayed]

FINDINGS: Alignment: No significant listhesis is present. Cervical lordosis
preserved.

Vertebrae: Hemangioma is present at T1. Mild endplate changes
present at C4-5, C5-6 and C6-7. Marrow signal and vertebral body
heights are otherwise within normal limits.

Cord: Normal signal and morphology.

Posterior Fossa, vertebral arteries, paraspinal tissues:
Craniocervical junction is normal. Flow is present in the vertebral
arteries bilaterally. Visualized intracranial contents are normal.

Disc levels:

C2-3: Uncovertebral and facet hypertrophy contribute to mild left
foraminal narrowing.

C3-4: A broad-based disc osteophyte complex present. Uncovertebral
and facet hypertrophy present bilaterally. Severe left and moderate
right foraminal narrowing is present. Partial effacement of ventral
CSF noted.

C4-5: A broad-based disc osteophyte complex is present. Asymmetric
effacement of the ventral CSF worse on the right. No abnormal cord
signal is present. Uncovertebral and facet hypertrophy contribute to
severe foraminal narrowing bilaterally.

C5-6: A broad-based disc osteophyte complex is present. Partial
effacement of ventral CSF noted. Moderate foraminal narrowing is
worse left than right, due to uncovertebral and facet disease.

C6-7: A broad-based disc osteophyte complex partially effaces the
ventral CSF. Uncovertebral disease contributes to moderate foraminal
narrowing bilaterally, left greater right.

C7-T1: Mild facet hypertrophy is present. Right foraminal narrowing
is present. No other significant stenosis is present.

T1-2: Uncovertebral and facet hypertrophy contribute to moderate
foraminal narrowing, right greater than left.

## 2020-12-22 ENCOUNTER — Other Ambulatory Visit: Payer: Self-pay | Admitting: Internal Medicine

## 2020-12-22 DIAGNOSIS — F324 Major depressive disorder, single episode, in partial remission: Secondary | ICD-10-CM

## 2020-12-22 MED ORDER — DULOXETINE HCL 60 MG PO CPEP: ORAL_CAPSULE | ORAL | 1 refills | Status: DC

## 2020-12-22 NOTE — Telephone Encounter (Signed)
Requested Prescriptions  Pending Prescriptions Disp Refills  . DULoxetine (CYMBALTA) 60 MG capsule 90 capsule 0    Sig: TAKE 1 CAPSULE DAILY (NEED APPOINTMENT IN OCTOBER)     Psychiatry: Antidepressants - SNRI Passed - 12/22/2020  1:30 PM      Passed - Completed PHQ-2 or PHQ-9 in the last 360 days      Passed - Last BP in normal range    BP Readings from Last 1 Encounters:  11/29/20 128/68         Passed - Valid encounter within last 6 months    Recent Outpatient Visits          3 weeks ago Prediabetes   [facility] [place], Salvadore [place], NP   3 months ago Abnormal urine odor   [facility], Salvadore Oxford, NP   6 months ago Rectal pain   [facility] [place], [place], NP   7 months ago Acute cystitis with hematuria   [facility] [place], Netta Neat, DO   10 months ago Essential hypertension   [facility], Jodelle Gross, FNP      Future Appointments            In 4 months [facility], [facility]

## 2020-12-22 NOTE — Telephone Encounter (Signed)
Copied from CRM [id number]. Topic: Quick Communication - Rx Refill/Question >> Dec 22, 2020 11:53 AM Jaquita Rector A wrote: Medication: DULoxetine (CYMBALTA) 60 MG capsule  Need Rx sent to Pharmacy for 2 week supply   Has the patient contacted their pharmacy? Yes.  Requested by express script  (Agent: If no, request that the patient contact the pharmacy for the refill. If patient does not wish to contact the pharmacy document the reason why and proceed with request.) (Agent: If yes, when and what did the pharmacy advise?)  Preferred Pharmacy (with phone number or street name): [id number] Nicholes Rough, [place] - [address] AT [facility] [address]  Phone:  [phone] Fax:  [phone]    Has the patient been seen for an appointment in the last year OR does the patient have an upcoming appointment? Yes.    Agent: Please be advised that RX refills may take up to 3 business days. We ask that you follow-up with your pharmacy.

## 2020-12-27 ENCOUNTER — Other Ambulatory Visit: Payer: Self-pay

## 2020-12-31 ENCOUNTER — Telehealth: Payer: Self-pay

## 2020-12-31 NOTE — Telephone Encounter (Signed)
The pt would like to proceed with the referral to neurosurgeon.

## 2020-12-31 NOTE — Telephone Encounter (Signed)
Copied from CRM [id number]. Topic: General - Other >> Dec 31, 2020 11:37 AM Gaetana Michaelis A wrote: Reason for CRM: The patient would like to be contacted by a member of staff when possible  The patient would like to review their MRI from 12/13/20  Please contact further when available

## 2021-01-24 ENCOUNTER — Ambulatory Visit
Admission: RE | Admit: 2021-01-24 | Discharge: 2021-01-24 | Disposition: A | Discharge status: Home / Self Care | Payer: Medicare Other | Source: Ambulatory Visit | Attending: Internal Medicine | Admitting: Internal Medicine

## 2021-01-24 ENCOUNTER — Other Ambulatory Visit: Payer: Self-pay

## 2021-01-24 DIAGNOSIS — Z1231 Encounter for screening mammogram for malignant neoplasm of breast: Secondary | ICD-10-CM | POA: Insufficient documentation

## 2021-01-24 IMAGING — MG MM DIGITAL SCREENING BILAT W/ TOMO AND CAD
6 of 10 series · 6 of 30 positions shown · non-contrast
Comparison: Previous exam(s).

ACR Breast Density Category a: The breast tissue is almost entirely
fatty.

CLINICAL DATA: Screening.

EXAM:
DIGITAL SCREENING BILATERAL MAMMOGRAM WITH TOMOSYNTHESIS AND CAD
TECHNIQUE: Bilateral screening digital craniocaudal and mediolateral oblique
mammograms were obtained. Bilateral screening digital breast
tomosynthesis was performed. The images were evaluated with
computer-aided detection. Limited assessment of the posterior
tissues due to patient mobility.

[R CV synth-2D]
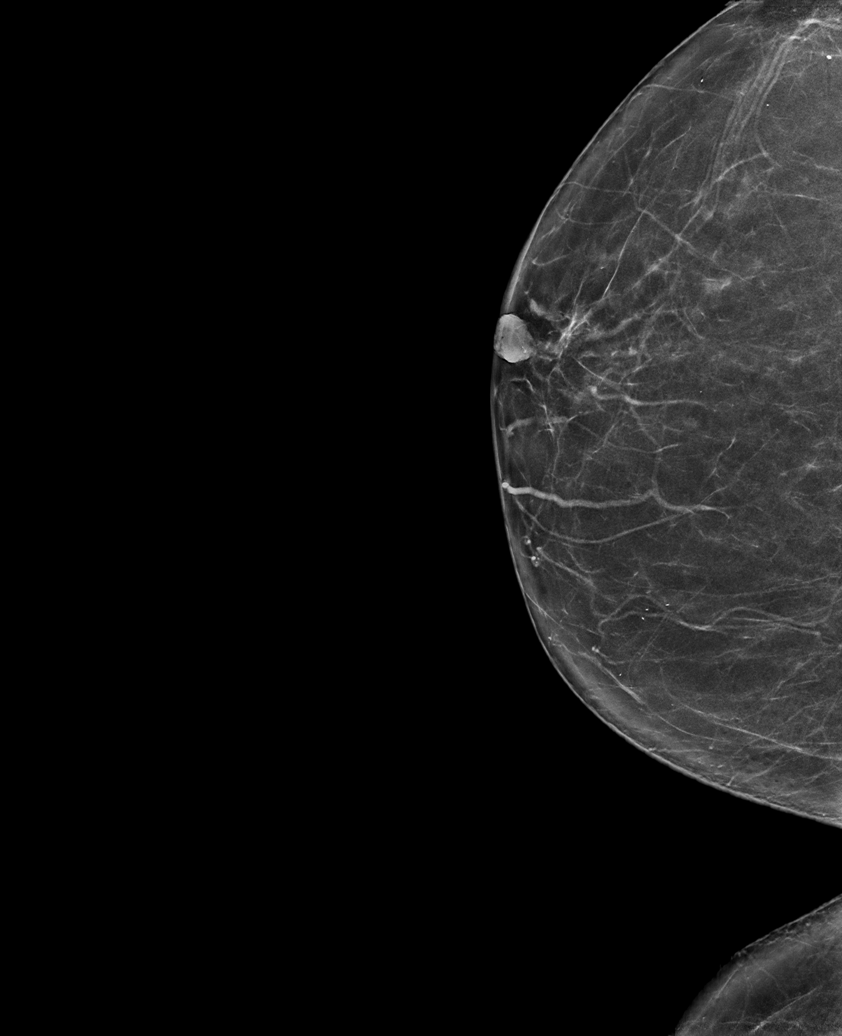

[R CC synth-2D]
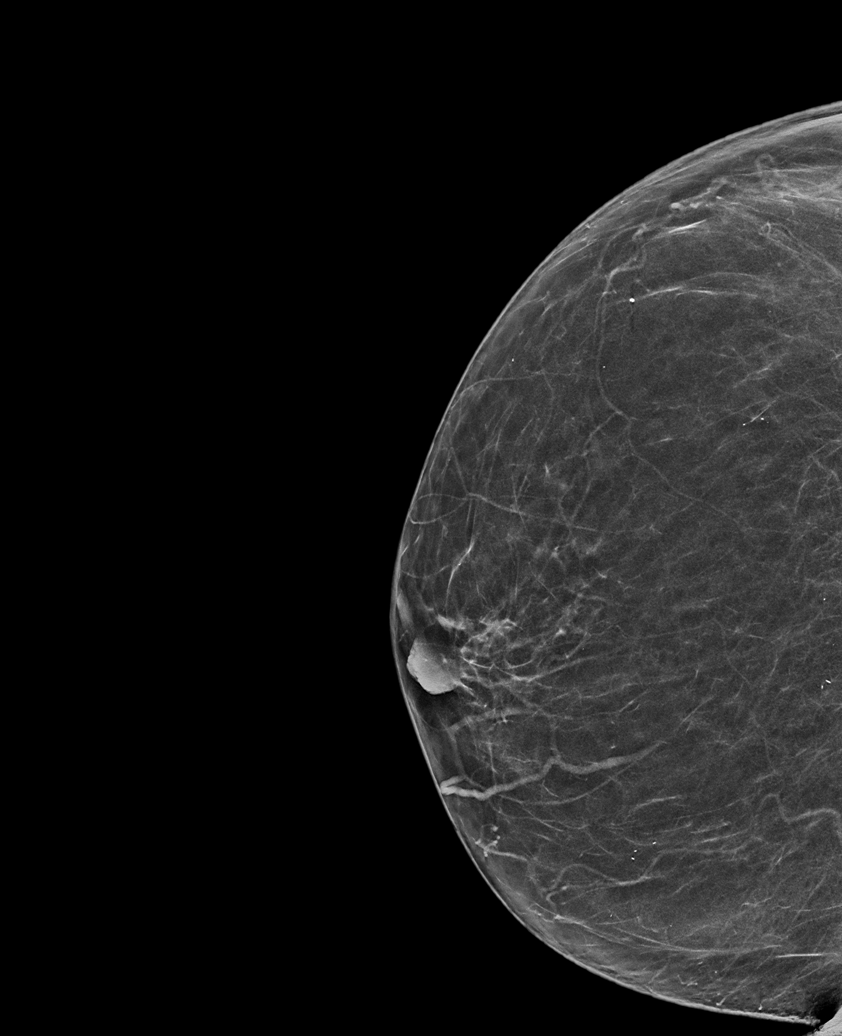

[R MLO synth-2D]
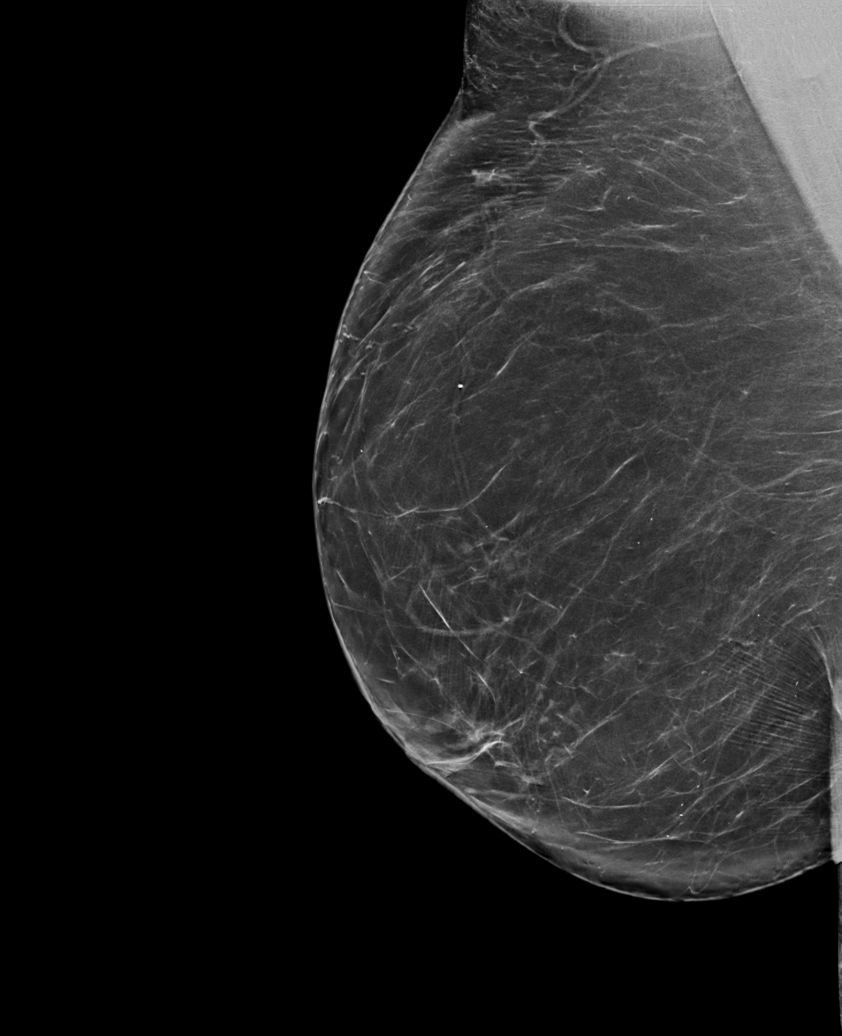

[L MLO synth-2D]
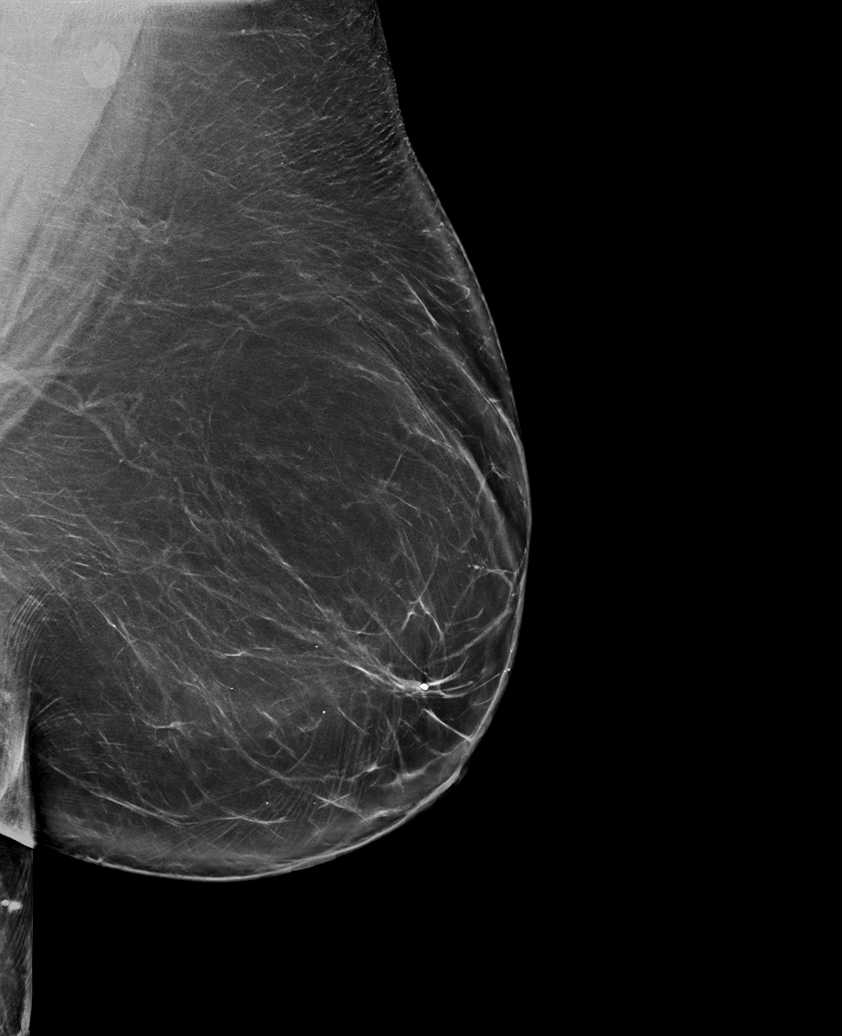

[L CC synth-2D]
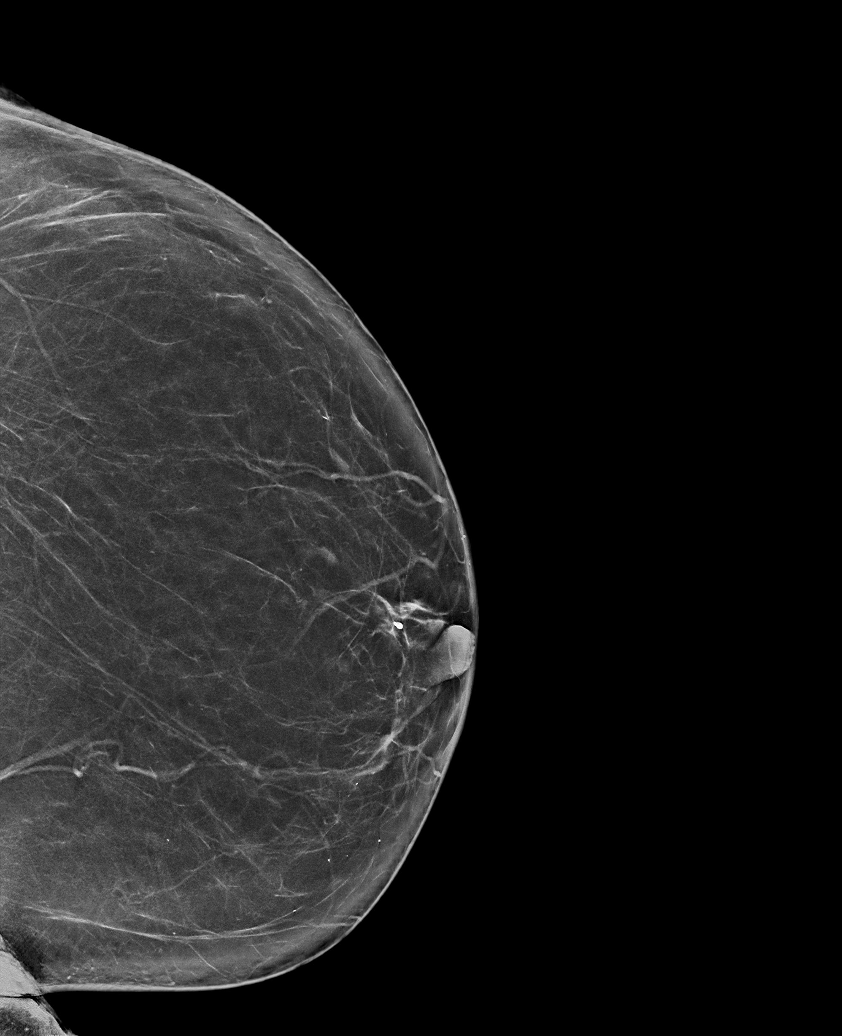

[R MLO tomo · tomo slice 45/88.0]
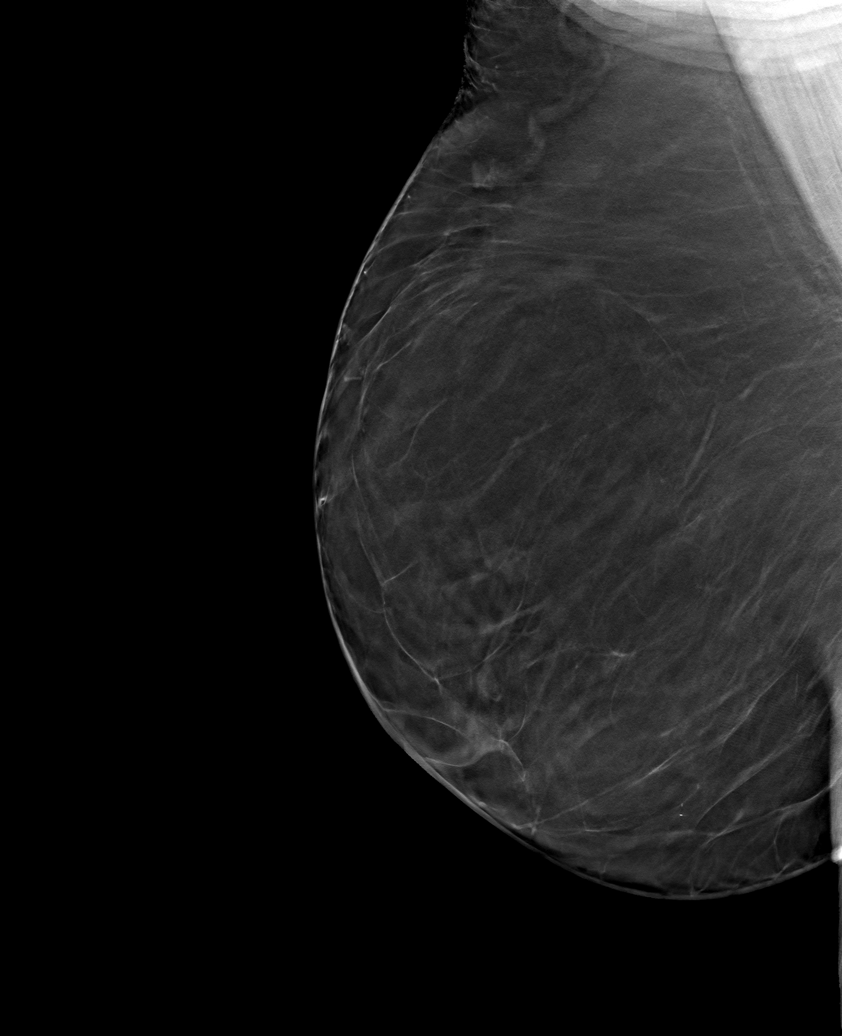

[6 of 30 positions shown; findings below may reference images not displayed]

FINDINGS: There are no findings suspicious for malignancy.
IMPRESSION: No mammographic evidence of malignancy. A result letter of this
screening mammogram will be mailed directly to the patient.

RECOMMENDATION:
Screening mammogram in one year. (Code:[TE])

BI-RADS CATEGORY  1: Negative.

## 2021-01-27 ENCOUNTER — Other Ambulatory Visit: Payer: Self-pay | Admitting: Internal Medicine

## 2021-01-27 DIAGNOSIS — I10 Essential (primary) hypertension: Secondary | ICD-10-CM

## 2021-01-27 NOTE — Telephone Encounter (Signed)
Requested Prescriptions  Pending Prescriptions Disp Refills   triamterene-hydrochlorothiazide (MAXZIDE-25) 37.5-25 MG tablet [Pharmacy Med Name: TRIAMTERENE/HYDROCHLOROTHIAZIDE TAB 37.5/25MG ] 90 tablet 3    Sig: TAKE 1 TABLET DAILY     Cardiovascular: Diuretic Combos Failed - 01/27/2021  2:21 AM      Failed - Cr in normal range and within 360 days    Creat  Date Value Ref Range Status  11/29/2020 1.22 (H) 0.60 - 0.95 mg/dL Final         Passed - K in normal range and within 360 days    Potassium  Date Value Ref Range Status  11/29/2020 4.1 3.5 - 5.3 mmol/L Final         Passed - Na in normal range and within 360 days    Sodium  Date Value Ref Range Status  11/29/2020 141 135 - 146 mmol/L Final         Passed - Ca in normal range and within 360 days    Calcium  Date Value Ref Range Status  11/29/2020 9.5 8.6 - 10.4 mg/dL Final         Passed - Last BP in normal range    BP Readings from Last 1 Encounters:  11/29/20 128/68         Passed - Valid encounter within last 6 months    Recent Outpatient Visits          1 month ago Prediabetes   [place], Coralie Keens, NP   4 months ago Abnormal urine odor   [facility] [place], Coralie Keens, NP   7 months ago Rectal pain   [facility] [place], [place] W, NP   8 months ago Acute cystitis with hematuria   [facility] Olin Hauser, DO   11 months ago Essential hypertension   [place], FNP      Future Appointments            In 3 months [facility], [facility]

## 2021-02-11 ENCOUNTER — Ambulatory Visit: Payer: Self-pay | Admitting: *Deleted

## 2021-02-11 NOTE — Telephone Encounter (Signed)
Patient states she has "lump" in abdomen below her initial hernia. Patient is not sure if she has another hernia or if there is something else.  Chief Complaint: abdominal lump Symptoms: lump in abdomen, changes in bowels- more trouble with constipation Frequency: 2-3 weeks Pertinent Negatives: Patient denies pain Disposition: [] ED /[x] Urgent Care (no appt availability in office) / [] Appointment(In office/virtual)/ []  [place] [facility]/ [] Home Care/ [] Refused Recommended Disposition /[] [facility]/ []  Follow-up with PCP Additional Notes: Advised UC- no appointment in office for Monday   Reason for Disposition  Age > 60 years  Answer Assessment - Initial Assessment Questions 1. LOCATION: "Where does it hurt?"      Hx hernia- pain is above hernia 2. RADIATION: "Does the pain shoot anywhere else?" (e.g., chest, back)     *No Answer* 3. ONSET: "When did the pain begin?" (e.g., minutes, hours or days ago)      No pain- lump in abdomen 4. SUDDEN: "Gradual or sudden onset?"     *No Answer* 5. PATTERN "Does the pain come and go, or is it constant?"    - If constant: "Is it getting better, staying the same, or worsening?"      (Note: Constant means the pain never goes away completely; most serious pain is constant and it progresses)     - If intermittent: "How long does it last?" "Do you have pain now?"     (Note: Intermittent means the pain goes away completely between bouts)     *No Answer* 6. SEVERITY: "How bad is the pain?"  (e.g., Scale 1-10; mild, moderate, or severe)   - MILD (1-3): doesn't interfere with normal activities, abdomen soft and not tender to touch    - MODERATE (4-7): interferes with normal activities or awakens from sleep, abdomen tender to touch    - SEVERE (8-10): excruciating pain, doubled over, unable to do any normal activities      No pain 7. RECURRENT SYMPTOM: "Have you ever had this type of stomach pain before?" If Yes, ask: "When was the last  time?" and "What happened that time?"      *No Answer* 8. CAUSE: "What do you think is causing the stomach pain?"     Possible hernia 9. RELIEVING/AGGRAVATING FACTORS: "What makes it better or worse?" (e.g., movement, antacids, bowel movement)     *No Answer* 10. OTHER SYMPTOMS: "Do you have any other symptoms?" (e.g., back pain, diarrhea, fever, urination pain, vomiting)       No constipation- patient moves bowels at least every 2 days 11. PREGNANCY: "Is there any chance you are pregnant?" "When was your last menstrual period?"       *No Answer*  Protocols used: Abdominal Pain - [facility]

## 2021-02-14 NOTE — Telephone Encounter (Signed)
FYI

## 2021-02-14 NOTE — Telephone Encounter (Signed)
Patient called and advised of the message below from [place] to be seen this week. She says her daughter will have to bring her and she's sleep at the moment and will wake up at 8 pm. I advised to call the office first thing in the morning as early as 7 am and someone will be available to answer the calls. Advised of the appointments at this time for tomorrow at [place], [place], and 1520, then on Friday afternoon at 1500 and 1520. Patient  says she went to [facility] the other day, after speaking to a nurse at the office, and the provider advised she will need an abdominal ultrasound. She says she hasn't gone to the hospital to have that done. I advised to wait on the ultrasound until seen by [facility] and she will advise what to do. Patient verbalized understanding.

## 2021-02-14 NOTE — Telephone Encounter (Signed)
No need for urgent care.  This is something I can evaluate later in the week.  Have her schedule an appointment in office.

## 2021-02-14 NOTE — Telephone Encounter (Signed)
Tried calling; no answer.  PEC please offer Ms Viscardi an appointment with Rene Kocher if she calls back.     Thanks,   -Vernona Rieger

## 2021-02-15 ENCOUNTER — Encounter: Payer: Self-pay | Admitting: Internal Medicine

## 2021-02-15 ENCOUNTER — Other Ambulatory Visit: Payer: Self-pay

## 2021-02-15 ENCOUNTER — Ambulatory Visit: Payer: Medicare Other | Admitting: Internal Medicine

## 2021-02-15 ENCOUNTER — Ambulatory Visit (INDEPENDENT_AMBULATORY_CARE_PROVIDER_SITE_OTHER): Payer: Medicare Other | Admitting: Internal Medicine

## 2021-02-15 VITALS — BP 142/76 | HR 57 | Temp 97.0°F | Wt 316.0 lb

## 2021-02-15 DIAGNOSIS — R1901 Right upper quadrant abdominal swelling, mass and lump: Secondary | ICD-10-CM | POA: Diagnosis not present

## 2021-02-15 DIAGNOSIS — M159 Polyosteoarthritis, unspecified: Secondary | ICD-10-CM

## 2021-02-15 DIAGNOSIS — R269 Unspecified abnormalities of gait and mobility: Secondary | ICD-10-CM | POA: Diagnosis not present

## 2021-02-15 DIAGNOSIS — R2689 Other abnormalities of gait and mobility: Secondary | ICD-10-CM

## 2021-02-15 DIAGNOSIS — M5412 Radiculopathy, cervical region: Secondary | ICD-10-CM

## 2021-02-15 NOTE — Assessment & Plan Note (Signed)
She is not interested in referral to neurosurgery for further evaluation at this time

## 2021-02-15 NOTE — Patient Instructions (Signed)
° ° ° °Hernia, Adult °  °A hernia happens when an organ or tissue inside your body pushes out through a weak spot in the muscles of your belly (abdomen). This makes a bulge. The bulge may be: °In a scar from a surgery that was done in your belly (incisional hernia). °Near your belly button (umbilical hernia). °In your groin (inguinal hernia). Your groin is the area where your leg meets your lower belly. If you are a female, this type could also be in your scrotum. °In your upper thigh (femoral hernia). °Inside your belly (hiatal hernia). This happens when your stomach slides above the muscle between your belly and your chest (diaphragm). °What are the causes? °This condition may be caused by: °Lifting heavy things. °Coughing over a long period of time. °Having trouble pooping (constipation). Trouble pooping can lead to straining. °A cut from surgery in your belly. °A physical problem that is present at birth. °Being very overweight. °Smoking. °Too much fluid in your belly. °A testicle that has not moved down into the scrotum, in males. °What are the signs or symptoms? °The main symptom is a bulge in the area of the hernia, but a bulge may not always be seen. It may grow bigger or be easier to see when you cough or strain (such as when lifting something heavy). °A hernia that can be pushed back into the belly rarely causes pain. A hernia that cannot be pushed back into the belly may lose its blood supply. This may cause: °Pain. °Fever. °A feeling like you may vomit, and vomiting. °Swelling. °Trouble pooping. °How is this treated? °A hernia that is small and painless may not need to be treated. A hernia that is large or painful may be treated with surgery. °Surgery to treat a hernia involves pushing the bulge back into place and repairing the weak area of the muscle or belly. °Follow these instructions at home: °Activity °Avoid straining the muscles near your hernia. This can happen when you: °Lift something  heavy. °Poop (have a bowel movement). °Do not lift anything that is heavier than 10 lb (4.5 kg), or the limit that you are told. °When you lift something heavy, use your leg muscles. Do not use your back muscles to lift. °Prevent trouble pooping °If told by your doctor, take steps to prevent trouble pooping. You may need to: °Drink enough fluid to keep your pee (urine) pale yellow. °Take medicines. You will be told what medicines to take. °Eat foods that are high in fiber. These include beans, whole grains, and fresh fruits and vegetables. °Limit foods that are high in fat and sugar. These include fried or sweet foods. °General instructions °When you cough, try to cough gently. °You may try to push your hernia back in by gently pressing on it when you are lying down. Do not try to force the bulge back in if it will not go in easily. °If you are overweight, work with your doctor to lose weight safely. °Do not smoke or use any products that contain nicotine or tobacco. If you need help quitting, ask your doctor. °If you will be having surgery, watch your hernia for changes in shape, size, or color. Tell your doctor if you see any changes. °Take over-the-counter and prescription medicines only as told by your doctor. °Keep all follow-up visits. °Contact a doctor if: °You get new pain, swelling, or redness near your hernia. °You poop fewer times in a week than normal. °You have trouble pooping. °You   have poop that is more dry than normal. °You have poop that is harder or larger than normal. °Get help right away if: °You have a fever or chills. °You have belly pain that gets worse. °You feel like you may vomit, or you vomit. °Your hernia cannot be pushed in by gently pressing on it when you are lying down. °Your hernia: °Changes in shape or size. °Changes color. °Feels hard, or it hurts when you touch it. °These symptoms may be an emergency. Get help right away. Call your local emergency services (911 in the [place] °Do  not wait to see if the symptoms will go away. °Do not drive yourself to the hospital. °Summary °A hernia happens when an organ or tissue inside your body pushes out through a weak spot in the belly muscles. This creates a bulge. °If your hernia is small and it does not hurt, you may not need treatment. If your hernia is large or it hurts, you may need surgery. °If you will be having surgery, watch your hernia for changes in shape, size, or color. Tell your doctor about any changes. °This information is not intended to replace advice given to you by your health care provider. Make sure you discuss any questions you have with your health care provider. °Document Revised: 08/11/2019 Document Reviewed: 08/11/2019 °Elsevier Patient Education © 2022 [employer] ° °

## 2021-02-15 NOTE — Assessment & Plan Note (Signed)
Causing difficulty with gait Referral to home health PT for further evaluation and treatment

## 2021-02-15 NOTE — Progress Notes (Signed)
Subjective:    Patient ID: Lindsey Jacobs, female    DOB: 22-Oct-1935, 86 y.o.   MRN: [id number]  HPI  Patient presents the clinic today with complaint of a lump in her abdomen.  She noticed this a couple of weeks ago.  She reports the lump is just above her previous cholecystectomy scare. She was seen at [facility] urgent care for the same. They advised her she would need to see her PCP for an abdominal ultrasound for confirmation that this was an abdominal wall hernia. She denies nausea, abdominal pain but has had some changes in the regularity of her bowels. She has not noticed diarrhea or blood in her stool.  She also reports difficulty with gait, balance, and chronic leg pain secondary to osteoarthritis. She has had bilateral knee replacements. She walks short distances with a walker in her home. She uses a wheelchair for longer distances or when she goes out of the home. She denies recent fall. She reports she used to do aerobics 3 x week at [facility], stopped during covid.  She would also like to review her MRI cervical spine from 11/2020.  FINDINGS: Alignment: No significant listhesis is present. Cervical lordosis preserved.   Vertebrae: Hemangioma is present at T1. Mild endplate changes present at C4-5, C5-6 and C6-7. Marrow signal and vertebral body heights are otherwise within normal limits.   Cord: Normal signal and morphology.   Posterior Fossa, vertebral arteries, paraspinal tissues: Craniocervical junction is normal. Flow is present in the vertebral arteries bilaterally. Visualized intracranial contents are normal.   Disc levels:   C2-3: Uncovertebral and facet hypertrophy contribute to mild left foraminal narrowing.   C3-4: A broad-based disc osteophyte complex present. Uncovertebral and facet hypertrophy present bilaterally. Severe left and moderate right foraminal narrowing is present. Partial effacement of ventral CSF noted.   C4-5: A broad-based disc osteophyte  complex is present. Asymmetric effacement of the ventral CSF worse on the right. No abnormal cord signal is present. Uncovertebral and facet hypertrophy contribute to severe foraminal narrowing bilaterally.   C5-6: A broad-based disc osteophyte complex is present. Partial effacement of ventral CSF noted. Moderate foraminal narrowing is worse left than right, due to uncovertebral and facet disease.   C6-7: A broad-based disc osteophyte complex partially effaces the ventral CSF. Uncovertebral disease contributes to moderate foraminal narrowing bilaterally, left greater right.   C7-T1: Mild facet hypertrophy is present. Right foraminal narrowing is present. No other significant stenosis is present.   T1-2: Uncovertebral and facet hypertrophy contribute to moderate foraminal narrowing, right greater than left.  She denies chronic neck pain but does reports numbness, tingling and weakness of her left upper extremity. She reports this is worse at night but she wears a brace on her left wrist that does seem to help.  Review of Systems     Past Medical History:  Diagnosis Date   Allergy    Colon polyps    Depression    GERD (gastroesophageal reflux disease)    Glaucoma    Hyperlipidemia    Hypertension    Sleep apnea    Urinary incontinence     Current Outpatient Medications  Medication Sig Dispense Refill   Carboxymeth-Glyc-Polysorb PF (REFRESH DIGITAL PF) 0.5-1-0.5 % SOLN Apply to eye.     Cholecalciferol (VITAMIN D3 MAXIMUM STRENGTH PO) Take by mouth.     DULoxetine (CYMBALTA) 60 MG capsule TAKE 1 CAPSULE DAILY (NEED APPOINTMENT IN OCTOBER) 90 capsule 1   latanoprost (XALATAN) 0.005 % ophthalmic solution  1 drop at bedtime.     Magnesium Oxide 500 MG TABS Take by mouth. Patient is taking 2 tablet once daily.     meclizine (ANTIVERT) 12.5 MG tablet Take 1 tablet (12.5 mg total) by mouth 3 (three) times daily as needed for dizziness. 90 tablet 1   Multiple Vitamins-Minerals  (ADVANCED EYE HEALTH PO) Take by mouth.     naproxen (NAPROSYN) 500 MG tablet Take 1 tablet (500 mg total) by mouth daily as needed for moderate pain. Take with meal 90 tablet 1   pantoprazole (PROTONIX) 40 MG tablet Take 1 tablet (40 mg total) by mouth daily. 90 tablet 3   triamcinolone cream (KENALOG) 0.1 % Apply 1 application topically 2 (two) times daily. 30 g 0   triamterene-hydrochlorothiazide (MAXZIDE-25) 37.5-25 MG tablet TAKE 1 TABLET DAILY 90 tablet 1   No current facility-administered medications for this visit.    Allergies  Allergen Reactions   Amoxapine And Related     Hives   Diamox [Acetazolamide]    Keflex [Cephalexin] Rash   Tape Rash    Family History  Problem Relation Age of Onset   Heart disease Father    Colon polyps Father    Alcohol abuse Father    Breast cancer Neg Hx     Social History   Socioeconomic History   Marital status: Widowed    Spouse name: Not on file   Number of children: Not on file   Years of education: Not on file   Highest education level: Not on file  Occupational History   Occupation: retired  Tobacco Use   Smoking status: Never   Smokeless tobacco: Never  Vaping Use   Vaping Use: Never used  Substance and Sexual Activity   Alcohol use: Never   Drug use: Never   Sexual activity: Not Currently  Other Topics Concern   Not on file  Social History Narrative   Not on file   Social Determinants of Health   Financial Resource Strain: Low Risk    Difficulty of Paying Living Expenses: Not hard at all  Food Insecurity: No Food Insecurity   Worried About Charity fundraiser in the Last Year: Never true   [place] in the Last Year: Never true  Transportation Needs: No Transportation Needs   Lack of Transportation (Medical): No   Lack of Transportation (Non-Medical): No  Physical Activity: Inactive   Days of Exercise per Week: 0 days   Minutes of Exercise per Session: 0 min  Stress: No Stress Concern Present    Feeling of Stress : Not at all  Social Connections: Not on file  Intimate Partner Violence: Not on file     Constitutional: Denies fever, malaise, fatigue, headache or abrupt weight changes.  Respiratory: Denies difficulty breathing, shortness of breath, cough or sputum production.   Cardiovascular: Denies chest pain, chest tightness, palpitations or swelling in the hands or feet.  Gastrointestinal: Patient reports abdominal mass.  Denies abdominal pain, bloating, constipation, diarrhea or blood in the stool.  GU: Denies urgency, frequency, pain with urination, burning sensation, blood in urine, odor or discharge. Musculoskeletal: Pt reports weakness of left upper extremity difficulty with gait. Denies decrease in range of motion, muscle pain or joint pain and swelling.  Neurological: Pt reports paresthesia of LUE, difficulty with balance. Denies dizziness, difficulty with memory, difficulty with speech or problems with coordination.    No other specific complaints in a complete review of systems (except as listed in  HPI above).  Objective:   Physical Exam   BP (!) 142/76 (BP Location: Left Wrist, Patient Position: Sitting, Cuff Size: Normal)    Pulse (!) 57    Temp (!) 97 F (36.1 C) (Temporal)    Wt (!) 316 lb (143.3 kg)    LMP  (LMP Unknown)    SpO2 99%    BMI 52.59 kg/m   Wt Readings from Last 3 Encounters:  11/29/20 (!) 311 lb (141.1 kg)  09/14/20 (!) 312 lb (141.5 kg)  06/02/20 (!) 315 lb (142.9 kg)    General: Appears her stated age, obese, in NAD. Cardiovascular: Bradycardic with normal rhythm. S1,S2 noted.  No murmur, rubs or gallops noted.  Pulmonary/Chest: Normal effort and positive vesicular breath sounds. No respiratory distress. No wheezes, rales or ronchi noted.  Abdomen: Soft and nontender. Mass noted in the RUQ just above the medial cholecystectomy scar. Musculoskeletal: In wheelchair. Gait not visualized. Neurological: Alert and oriented.  Coordination normal.     BMET    Component Value Date/Time   NA 141 11/29/2020 0916   K 4.1 11/29/2020 0916   CL 104 11/29/2020 0916   CO2 28 11/29/2020 0916   GLUCOSE 95 11/29/2020 0916   BUN 20 11/29/2020 0916   CREATININE 1.22 (H) 11/29/2020 0916   CALCIUM 9.5 11/29/2020 0916   GFRNONAA 54 (L) 06/25/2019 1139   GFRAA 63 06/25/2019 1139    Lipid Panel     Component Value Date/Time   CHOL 183 11/29/2020 0916   TRIG 91 11/29/2020 0916   HDL 58 11/29/2020 0916   CHOLHDL 3.2 11/29/2020 0916   LDLCALC 106 (H) 11/29/2020 0916    CBC    Component Value Date/Time   WBC 8.0 06/25/2019 1139   RBC 4.96 06/25/2019 1139   HGB 14.0 06/25/2019 1139   HCT 42.4 06/25/2019 1139   PLT 244 06/25/2019 1139   MCV 85.5 06/25/2019 1139   MCH 28.2 06/25/2019 1139   MCHC 33.0 06/25/2019 1139   RDW 14.8 06/25/2019 1139   LYMPHSABS 1,592 06/25/2019 1139   EOSABS 520 (H) 06/25/2019 1139   BASOSABS 48 06/25/2019 1139    Hgb A1C Lab Results  Component Value Date   HGBA1C 5.6 11/29/2020           Assessment & Plan:   Mass of Abdomen:  Concerning for abdominal wall hernia US abdomen for further evaluation  Difficulty with Gait, Difficulty with Balance, Osteoarthritis:  Referral placed for home health PT for further evaluation and treatment  RTC in 4 months for follow up chronic conditions Webb Silversmith, NP This visit occurred during the SARS-CoV-2 public health emergency.  Safety protocols were in place, including screening questions prior to the visit, additional usage of staff PPE, and extensive cleaning of exam room while observing appropriate contact time as indicated for disinfecting solutions.

## 2021-02-15 NOTE — Assessment & Plan Note (Signed)
Encouraged weight loss as this could help with joint pain, gait and balance

## 2021-02-15 NOTE — Telephone Encounter (Signed)
Pt scheduled for 02/15/2021 at 9:40am   Thanks,   -Vernona Rieger

## 2021-02-22 ENCOUNTER — Other Ambulatory Visit: Payer: Self-pay

## 2021-02-22 ENCOUNTER — Ambulatory Visit
Admission: RE | Admit: 2021-02-22 | Discharge: 2021-02-22 | Disposition: A | Discharge status: Home / Self Care | Payer: Medicare Other | Source: Ambulatory Visit | Attending: Internal Medicine | Admitting: Internal Medicine

## 2021-02-22 DIAGNOSIS — R1901 Right upper quadrant abdominal swelling, mass and lump: Secondary | ICD-10-CM | POA: Diagnosis not present

## 2021-02-22 IMAGING — US US ABDOMEN LIMITED
1 series · 14 of 17 positions shown · non-contrast
Comparison: None.

CLINICAL DATA: Concern for abdominal wall hernia

EXAM:
ULTRASOUND ABDOMEN LIMITED

[Series 1: us abdomen limited · 17 acquisitions, 14 frames shown]
[im 1/17]
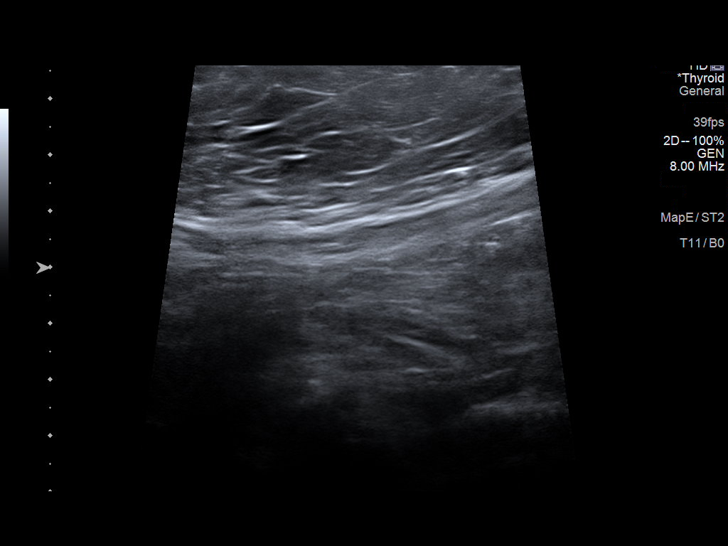
[im 2/17]
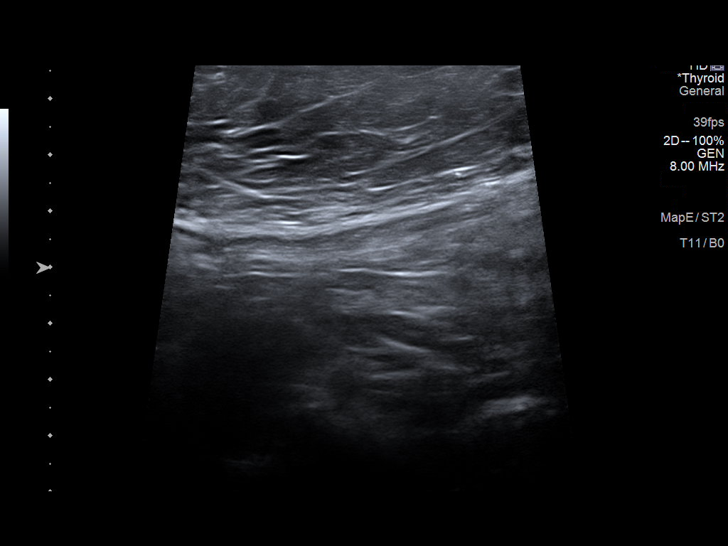
[im 4/17]
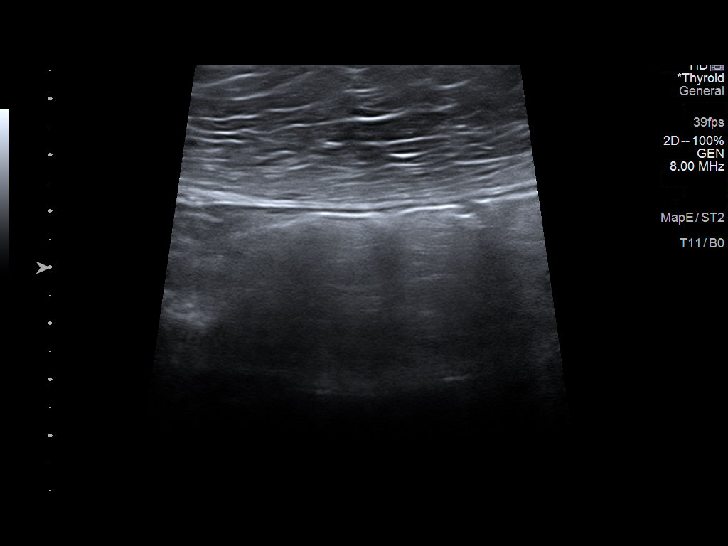
[im 5/17]
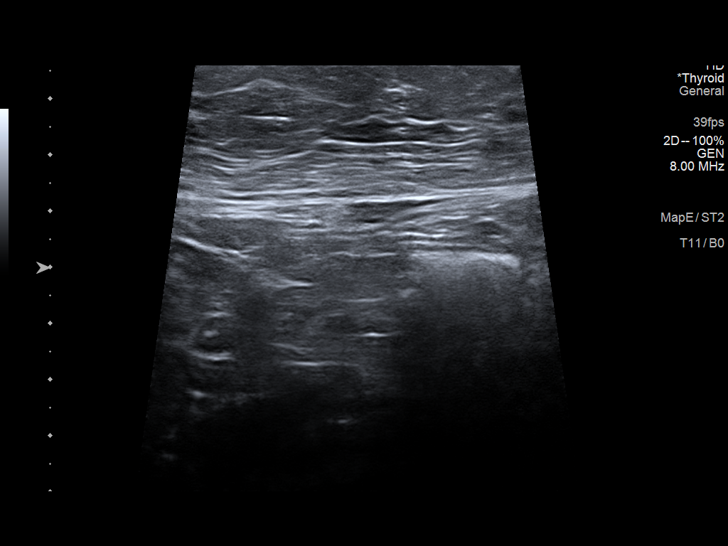
[im 6/17]
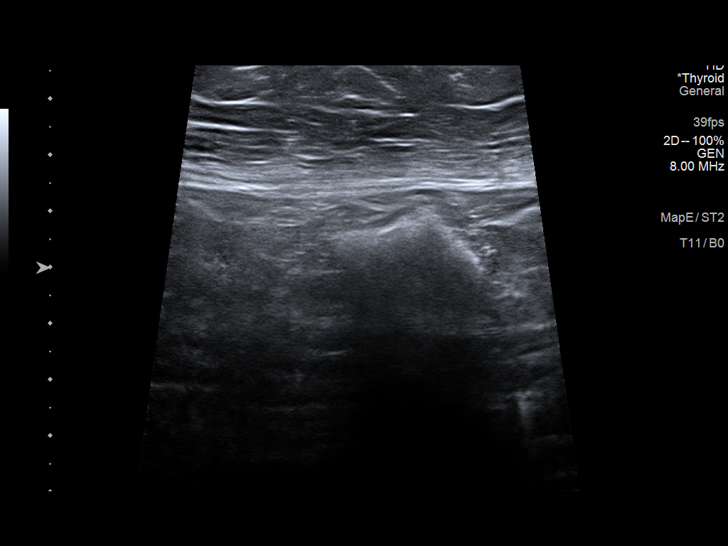
[im 7/17]
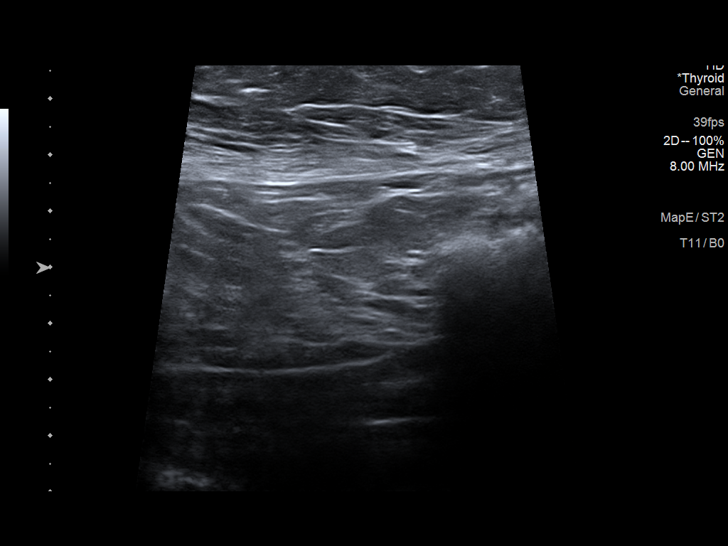
[im 8/17]
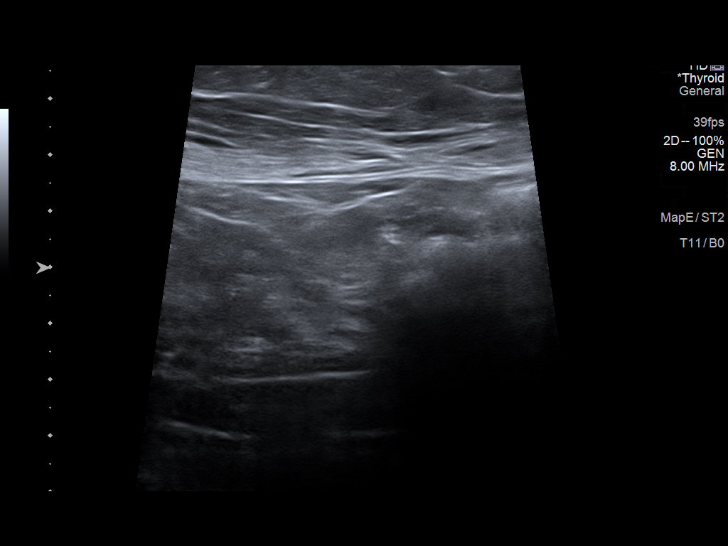
[im 10/17]
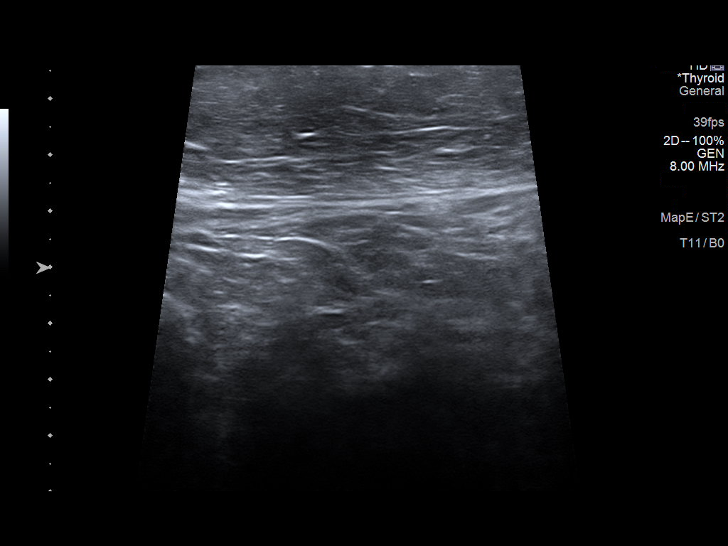
[im 11/17]
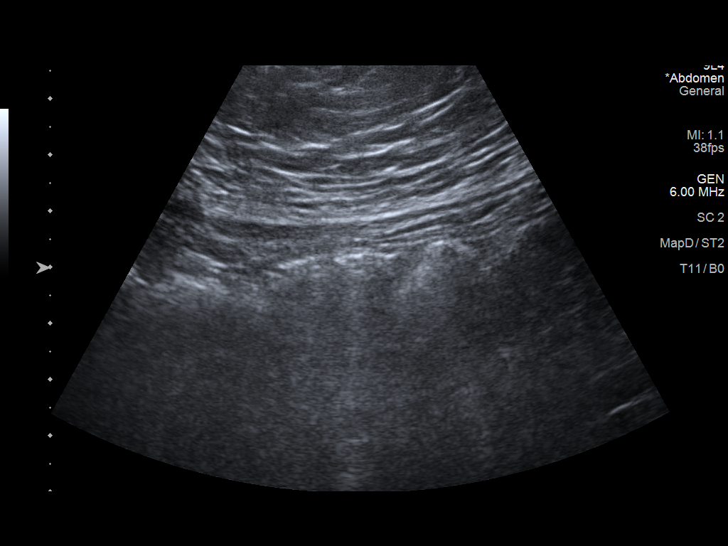
[im 12/17]
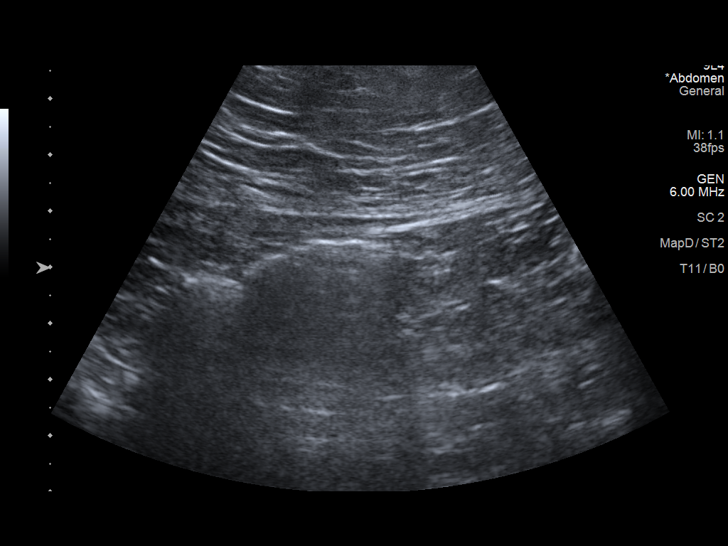
[im 13/17]
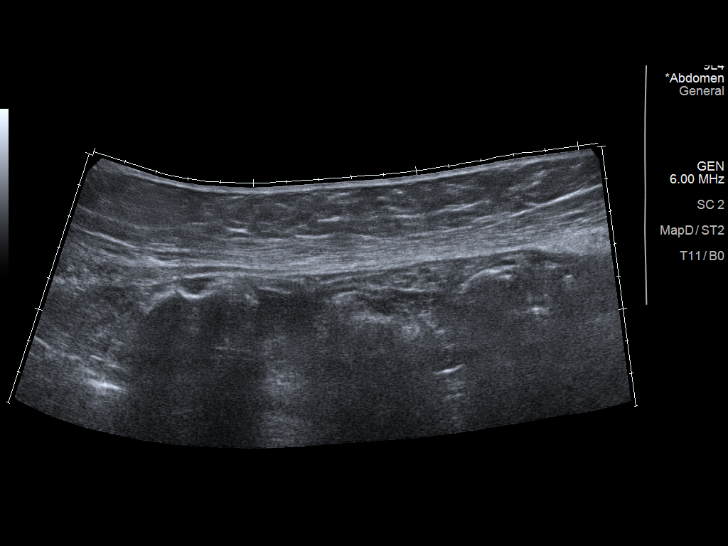
[im 14/17]
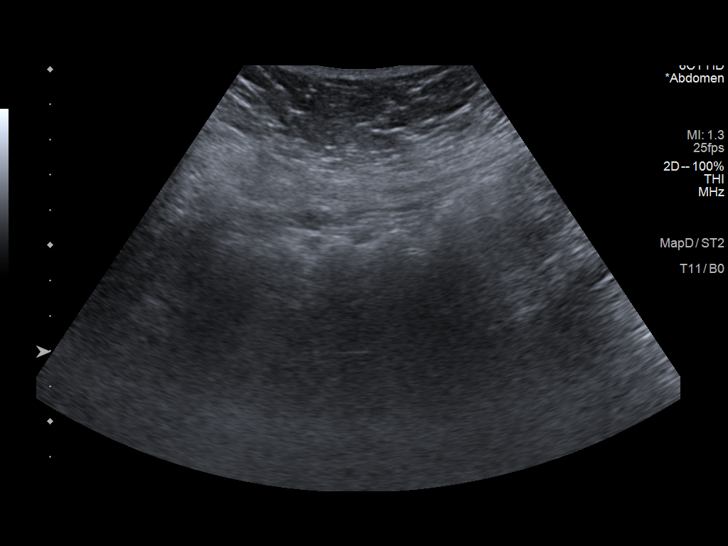
[im 16/17]
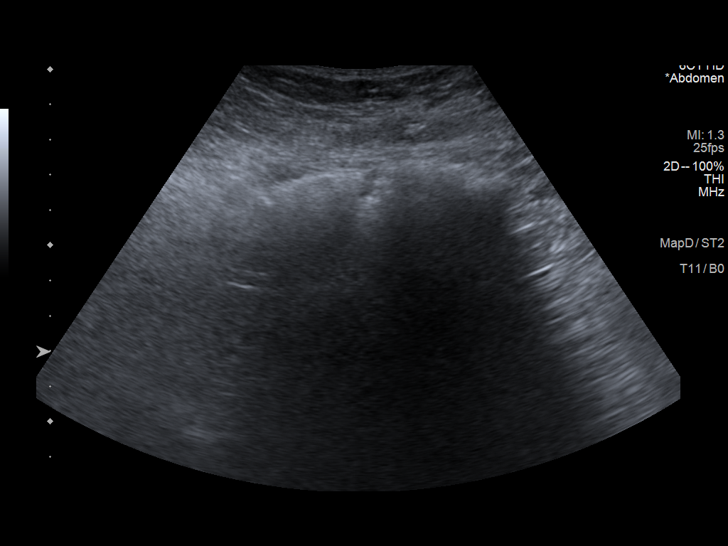
[im 17/17]
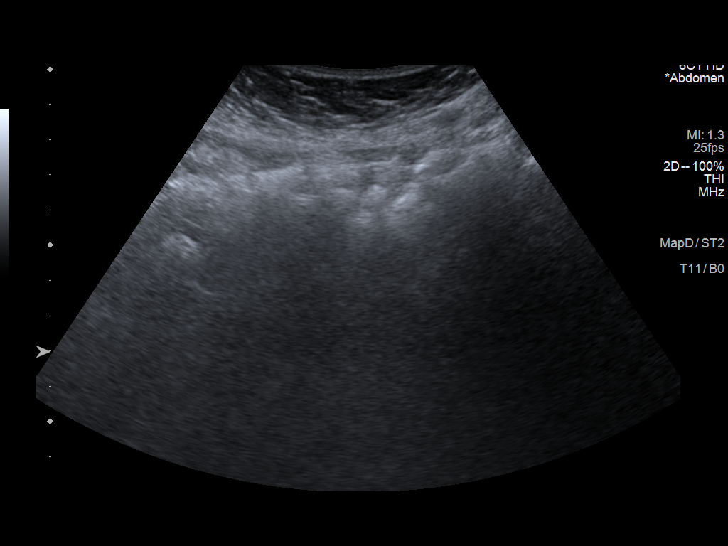

[14 of 17 positions shown; findings below may reference images not displayed]

FINDINGS: Targeted ultrasound of the right upper quadrant performed in the
region of concern with and without Valsalva. No cysts or solid mass
is seen. There is no bowel containing hernia.
IMPRESSION: Negative. No sonographic correlate for right upper quadrant bulging.
Further management will need to be based on clinical grounds

## 2021-03-23 ENCOUNTER — Encounter: Payer: Self-pay | Admitting: Internal Medicine

## 2021-03-23 DIAGNOSIS — F324 Major depressive disorder, single episode, in partial remission: Secondary | ICD-10-CM

## 2021-03-23 MED ORDER — DULOXETINE HCL 60 MG PO CPEP: ORAL_CAPSULE | ORAL | 1 refills | Status: DC

## 2021-05-10 ENCOUNTER — Ambulatory Visit: Payer: Medicare Other

## 2021-05-13 ENCOUNTER — Ambulatory Visit (INDEPENDENT_AMBULATORY_CARE_PROVIDER_SITE_OTHER): Payer: Medicare Other

## 2021-05-13 VITALS — BP 122/80 | HR 58 | Ht 65.0 in | Wt 316.0 lb

## 2021-05-13 DIAGNOSIS — Z0000 Encounter for general adult medical examination without abnormal findings: Secondary | ICD-10-CM | POA: Diagnosis not present

## 2021-05-13 NOTE — Progress Notes (Signed)
? ?Subjective:  ? Lindsey Jacobs is a 86 y.o. female who presents for Medicare Annual (Subsequent) preventive examination. ? ?Review of Systems    ? ?  ? ?   ?Objective:  ?  ?There were no vitals filed for this visit. ?There is no height or weight on file to calculate BMI. ? ? ?  05/04/2020  ? 11:07 AM 05/20/2019  ?  7:24 AM  ?Advanced Directives  ?Does Patient Have a Medical Advance Directive? Yes Yes  ?Type of Estate agent of [place];Living will   ?Copy of Healthcare Power of Attorney in Chart? No - copy requested   ? ? ?Current Medications (verified) ?Outpatient Encounter Medications as of 05/13/2021  ?Medication Sig  ? Carboxymeth-Glyc-Polysorb PF (REFRESH DIGITAL PF) 0.5-1-0.5 % SOLN Apply to eye.  ? Cholecalciferol (VITAMIN D3 MAXIMUM STRENGTH PO) Take by mouth.  ? dorzolamide-timolol (COSOPT) 22.3-6.8 MG/ML ophthalmic solution   ? DULoxetine (CYMBALTA) 60 MG capsule TAKE 1 CAPSULE DAILY (NEED APPOINTMENT IN OCTOBER)  ? latanoprost (XALATAN) 0.005 % ophthalmic solution 1 drop at bedtime.  ? Magnesium Oxide 500 MG TABS Take by mouth. Patient is taking 2 tablet once daily.  ? meclizine (ANTIVERT) 12.5 MG tablet Take 1 tablet (12.5 mg total) by mouth 3 (three) times daily as needed for dizziness.  ? Multiple Vitamins-Minerals (ADVANCED EYE HEALTH PO) Take by mouth.  ? naproxen (NAPROSYN) 500 MG tablet Take 1 tablet (500 mg total) by mouth daily as needed for moderate pain. Take with meal  ? pantoprazole (PROTONIX) 40 MG tablet Take 1 tablet (40 mg total) by mouth daily.  ? triamcinolone cream (KENALOG) 0.1 % Apply 1 application topically 2 (two) times daily.  ? triamterene-hydrochlorothiazide (MAXZIDE-25) 37.5-25 MG tablet TAKE 1 TABLET DAILY  ? ?No facility-administered encounter medications on file as of 05/13/2021.  ? ? ?Allergies (verified) ?Amoxapine and related, Diamox [acetazolamide], Keflex [cephalexin], and Tape  ? ?History: ?Past Medical History:  ?Diagnosis Date  ? Allergy   ? Colon  polyps   ? Depression   ? GERD (gastroesophageal reflux disease)   ? Glaucoma   ? Hyperlipidemia   ? Hypertension   ? Sleep apnea   ? Urinary incontinence   ? ?Past Surgical History:  ?Procedure Laterality Date  ? ABDOMINAL HYSTERECTOMY  1984  ? BREAST EXCISIONAL BIOPSY Right   ? benign  ? CARPAL TUNNEL RELEASE Right 1990  ? CATARACT EXTRACTION, BILATERAL Bilateral 2004  ? CESAREAN SECTION  1974  ? CHOLECYSTECTOMY  1974  ? ESOPHAGOGASTRODUODENOSCOPY (EGD) WITH PROPOFOL N/A 05/20/2019  ? Procedure: ESOPHAGOGASTRODUODENOSCOPY (EGD) WITH PROPOFOL;  Surgeon: Pasty Spillers, MD;  Location: [facility] ENDOSCOPY;  Service: Endoscopy;  Laterality: N/A;  ? EYE SURGERY    ? JOINT REPLACEMENT    ? REPLACEMENT TOTAL KNEE Left 2007  ? REPLACEMENT TOTAL KNEE BILATERAL Right 2006  ? VARICOSE VEIN SURGERY Left 1965  ? ?Family History  ?Problem Relation Age of Onset  ? Heart disease Father   ? Colon polyps Father   ? Alcohol abuse Father   ? Breast cancer Neg Hx   ? ?Social History  ? ?Socioeconomic History  ? Marital status: Widowed  ?  Spouse name: Not on file  ? Number of children: Not on file  ? Years of education: Not on file  ? Highest education level: Not on file  ?Occupational History  ? Occupation: retired  ?Tobacco Use  ? Smoking status: Never  ? Smokeless tobacco: Never  ?Vaping Use  ? Vaping Use:  Never used  ?Substance and Sexual Activity  ? Alcohol use: Never  ? Drug use: Never  ? Sexual activity: Not Currently  ?Other Topics Concern  ? Not on file  ?Social History Narrative  ? Not on file  ? ?Social Determinants of Health  ? ?Financial Resource Strain: Not on file  ?Food Insecurity: Not on file  ?Transportation Needs: Not on file  ?Physical Activity: Not on file  ?Stress: Not on file  ?Social Connections: Not on file  ? ? ?Tobacco Counseling ?Counseling given: Not Answered ? ? ?Clinical Intake: ? ?Pre-visit preparation completed: Yes ? ?Pain : No/denies pain ? ?  ? ?Nutritional Risks: None ?Diabetes: No ? ?How often do  you need to have someone help you when you read instructions, pamphlets, or other written materials from your doctor or pharmacy?: 1 - Never ? ?Diabetic?no ? ?Interpreter Needed?: No ? ?Information entered by :: Kennedy BuckerLorrie Keyondre Hepburn, LPN ? ? ?Activities of Daily Living ? ?  02/15/2021  ?  9:43 AM 11/29/2020  ?  9:14 AM  ?In your present state of health, do you have any difficulty performing the following activities:  ?Hearing? 1 1  ?Vision? 1 1  ?Difficulty concentrating or making decisions? 1 1  ?Walking or climbing stairs? 1 1  ?Dressing or bathing? 1 0  ?Doing errands, shopping? 1 1  ? ? ?Patient Care Team: ?Lorre MunroeBaity, Regina W, NP as PCP - General (Internal Medicine) ? ?Indicate any recent Medical Services you may have received from other than [facility] providers in the past year (date may be approximate). ? ?   ?Assessment:  ? This is a routine wellness examination for Lindsey Jacobs. ? ?Hearing/Vision screen ?No results found. ? ?Dietary issues and exercise activities discussed: ?  ? ? Goals Addressed   ?None ?  ? ?Depression Screen ? ?  02/15/2021  ?  9:42 AM 11/29/2020  ?  9:13 AM 09/14/2020  ?  9:56 AM 06/02/2020  ? 10:41 AM 05/04/2020  ? 11:09 AM 03/14/2019  ?  4:21 PM 02/03/2019  ? 10:52 AM  ?PHQ 2/9 Scores  ?PHQ - 2 Score 0 0 0 0 0 0 0  ?PHQ- 9 Score 1 2 0 1  0 0  ?  ?Fall Risk ? ?  02/15/2021  ?  9:42 AM 06/02/2020  ? 10:42 AM 05/04/2020  ? 11:09 AM 03/14/2019  ?  4:21 PM 02/03/2019  ? 10:51 AM  ?Fall Risk   ?Falls in the past year? 0 0 0 0 0  ?Number falls in past yr: 0 0  0 0  ?Injury with Fall? 0 1  0   ?Risk for fall due to : No Fall Risks No Fall Risks Impaired balance/gait;Impaired mobility;Medication side effect    ?Follow up Falls evaluation completed Falls evaluation completed Falls evaluation completed;Education provided;Falls prevention discussed Falls evaluation completed Falls evaluation completed  ? ? ?FALL RISK PREVENTION PERTAINING TO THE HOME: ? ?Any stairs in or around the home? No  ?If so, are there any without handrails?  No  ?Home free of loose throw rugs in walkways, pet beds, electrical cords, etc? Yes  ?Adequate lighting in your home to reduce risk of falls? Yes  ? ?ASSISTIVE DEVICES UTILIZED TO PREVENT FALLS: ? ?Life alert? No  ?Use of a cane, walker or w/c? Yes  ?Grab bars in the bathroom? Yes  ?Shower chair or bench in shower? Yes  ?Elevated toilet seat or a handicapped toilet? Yes  ? ?TIMED UP AND GO: ? ?  Was the test performed? Yes .  ?Length of time to ambulate 10 feet: 10 sec.  ? ?Gait unsteady without use of assistive device, provider informed and interventions were implemented ? ?Cognitive Function: ?  ?  ? ?  05/04/2020  ? 11:14 AM  ?6CIT Screen  ?What Year? 0 points  ?What month? 0 points  ?What time? 0 points  ?Count back from 20 0 points  ?Months in reverse 0 points  ?Repeat phrase 0 points  ?Total Score 0 points  ? ? ?Immunizations ?Immunization History  ?Administered Date(s) Administered  ? Fluad Quad(high Dose 65+) 10/15/2018, 11/20/2019  ? Influenza-Unspecified 09/27/2017, 10/18/2020  ? Moderna Sars-Covid-2 Vaccination 05/12/2020, 10/18/2020  ? PFIZER Comirnaty(Gray Top)Covid-19 Tri-Sucrose Vaccine 02/23/2019, 03/26/2019, 10/16/2019  ? Pneumococcal Conjugate-13 10/30/2013  ? Pneumococcal Polysaccharide-23 11/22/2000, 11/18/2010  ? Td 02/18/2001  ? Zoster Recombinat (Shingrix) 11/20/2019, 09/14/2020  ? Zoster, Live 06/19/2006  ? ? ?TDAP status: Due, Education has been provided regarding the importance of this vaccine. Advised may receive this vaccine at local pharmacy or Health Dept. Aware to provide a copy of the vaccination record if obtained from local pharmacy or Health Dept. Verbalized acceptance and understanding. ? ?Flu Vaccine status: Up to date ? ?Pneumococcal vaccine status: Up to date ? ?Covid-19 vaccine status: Completed vaccines ? ?Qualifies for Shingles Vaccine? Yes   ?Zostavax completed Yes   ?Shingrix Completed?: Yes ? ?Screening Tests ?Health Maintenance  ?Topic Date Due  ? TETANUS/TDAP  02/15/2022  (Originally 02/19/2011)  ? INFLUENZA VACCINE  08/16/2021  ? Pneumonia Vaccine 44+ Years old  Completed  ? DEXA SCAN  Completed  ? COVID-19 Vaccine  Completed  ? Zoster Vaccines- Shingrix  Completed  ? HPV

## 2021-05-13 NOTE — Patient Instructions (Signed)
Ms. Diclemente , ?Thank you for taking time to come for your Medicare Wellness Visit. I appreciate your ongoing commitment to your health goals. Please review the following plan we discussed and let me know if I can assist you in the future.  ? ?Screening recommendations/referrals: ?Colonoscopy: aged out ?Mammogram: 01/24/21 ?Bone Density: declined referral ?Recommended yearly ophthalmology/optometry visit for glaucoma screening and checkup ?Recommended yearly dental visit for hygiene and checkup ? ?Vaccinations: ?Influenza vaccine: 10/18/20 ?Pneumococcal vaccine: 10/30/13 ?Tdap vaccine: 02/18/01, due ?Shingles vaccine: Shingrix 11/20/19, 09/14/20   ?Covid-19:02/23/19, 03/26/19, 10/16/19, 05/12/20, 10/18/20 ? ?Advanced directives: yes, requested copy ? ?Conditions/risks identified: none ? ?Next appointment: Follow up in one year for your annual wellness visit 05/19/22 @ 10am in person ? ? ?Preventive Care 84 Years and Older, Female ?Preventive care refers to lifestyle choices and visits with your health care provider that can promote health and wellness. ?What does preventive care include? ?A yearly physical exam. This is also called an annual well check. ?Dental exams once or twice a year. ?Routine eye exams. Ask your health care provider how often you should have your eyes checked. ?Personal lifestyle choices, including: ?Daily care of your teeth and gums. ?Regular physical activity. ?Eating a healthy diet. ?Avoiding tobacco and drug use. ?Limiting alcohol use. ?Practicing safe sex. ?Taking low-dose aspirin every day. ?Taking vitamin and mineral supplements as recommended by your health care provider. ?What happens during an annual well check? ?The services and screenings done by your health care provider during your annual well check will depend on your age, overall health, lifestyle risk factors, and family history of disease. ?Counseling  ?Your health care provider may ask you questions about your: ?Alcohol use. ?Tobacco  use. ?Drug use. ?Emotional well-being. ?Home and relationship well-being. ?Sexual activity. ?Eating habits. ?History of falls. ?Memory and ability to understand (cognition). ?Work and work Astronomer. ?Reproductive health. ?Screening  ?You may have the following tests or measurements: ?Height, weight, and BMI. ?Blood pressure. ?Lipid and cholesterol levels. These may be checked every 5 years, or more frequently if you are over 45 years old. ?Skin check. ?Lung cancer screening. You may have this screening every year starting at age 110 if you have a 30-pack-year history of smoking and currently smoke or have quit within the past 15 years. ?Fecal occult blood test (FOBT) of the stool. You may have this test every year starting at age 72. ?Flexible sigmoidoscopy or colonoscopy. You may have a sigmoidoscopy every 5 years or a colonoscopy every 10 years starting at age 19. ?Hepatitis C blood test. ?Hepatitis B blood test. ?Sexually transmitted disease (STD) testing. ?Diabetes screening. This is done by checking your blood sugar (glucose) after you have not eaten for a while (fasting). You may have this done every 1-3 years. ?Bone density scan. This is done to screen for osteoporosis. You may have this done starting at age 54. ?Mammogram. This may be done every 1-2 years. Talk to your health care provider about how often you should have regular mammograms. ?Talk with your health care provider about your test results, treatment options, and if necessary, the need for more tests. ?Vaccines  ?Your health care provider may recommend certain vaccines, such as: ?Influenza vaccine. This is recommended every year. ?Tetanus, diphtheria, and acellular pertussis (Tdap, Td) vaccine. You may need a Td booster every 10 years. ?Zoster vaccine. You may need this after age 27. ?Pneumococcal 13-valent conjugate (PCV13) vaccine. One dose is recommended after age 80. ?Pneumococcal polysaccharide (PPSV23) vaccine. One dose is recommended  after age 65. ?Talk to your health care provider about which screenings and vaccines you need and how often you need them. ?This information is not intended to replace advice given to you by your health care provider. Make sure you discuss any questions you have with your health care provider. ?Document Released: 01/29/2015 Document Revised: 09/22/2015 Document Reviewed: 11/03/2014 ?Elsevier Interactive Patient Education ? 2017 [place]. ? ?Fall Prevention in the Home ?Falls can cause injuries. They can happen to people of all ages. There are many things you can do to make your home safe and to help prevent falls. ?What can I do on the outside of my home? ?Regularly fix the edges of walkways and driveways and fix any cracks. ?Remove anything that might make you trip as you walk through a door, such as a raised step or threshold. ?Trim any bushes or trees on the path to your home. ?Use bright outdoor lighting. ?Clear any walking paths of anything that might make someone trip, such as rocks or tools. ?Regularly check to see if handrails are loose or broken. Make sure that both sides of any steps have handrails. ?Any raised decks and porches should have guardrails on the edges. ?Have any leaves, snow, or ice cleared regularly. ?Use sand or salt on walking paths during winter. ?Clean up any spills in your garage right away. This includes oil or grease spills. ?What can I do in the bathroom? ?Use night lights. ?Install grab bars by the toilet and in the tub and shower. Do not use towel bars as grab bars. ?Use non-skid mats or decals in the tub or shower. ?If you need to sit down in the shower, use a plastic, non-slip stool. ?Keep the floor dry. Clean up any water that spills on the floor as soon as it happens. ?Remove soap buildup in the tub or shower regularly. ?Attach bath mats securely with double-sided non-slip rug tape. ?Do not have throw rugs and other things on the floor that can make you trip. ?What can I do  in the bedroom? ?Use night lights. ?Make sure that you have a light by your bed that is easy to reach. ?Do not use any sheets or blankets that are too big for your bed. They should not hang down onto the floor. ?Have a firm chair that has side arms. You can use this for support while you get dressed. ?Do not have throw rugs and other things on the floor that can make you trip. ?What can I do in the kitchen? ?Clean up any spills right away. ?Avoid walking on wet floors. ?Keep items that you use a lot in easy-to-reach places. ?If you need to reach something above you, use a strong step stool that has a grab bar. ?Keep electrical cords out of the way. ?Do not use floor polish or wax that makes floors slippery. If you must use wax, use non-skid floor wax. ?Do not have throw rugs and other things on the floor that can make you trip. ?What can I do with my stairs? ?Do not leave any items on the stairs. ?Make sure that there are handrails on both sides of the stairs and use them. Fix handrails that are broken or loose. Make sure that handrails are as long as the stairways. ?Check any carpeting to make sure that it is firmly attached to the stairs. Fix any carpet that is loose or worn. ?Avoid having throw rugs at the top or bottom of the stairs. If you do  have throw rugs, attach them to the floor with carpet tape. ?Make sure that you have a light switch at the top of the stairs and the bottom of the stairs. If you do not have them, ask someone to add them for you. ?What else can I do to help prevent falls? ?Wear shoes that: ?Do not have high heels. ?Have rubber bottoms. ?Are comfortable and fit you well. ?Are closed at the toe. Do not wear sandals. ?If you use a stepladder: ?Make sure that it is fully opened. Do not climb a closed stepladder. ?Make sure that both sides of the stepladder are locked into place. ?Ask someone to hold it for you, if possible. ?Clearly mark and make sure that you can see: ?Any grab bars or  handrails. ?First and last steps. ?Where the edge of each step is. ?Use tools that help you move around (mobility aids) if they are needed. These include: ?Canes. ?Walkers. ?Scooters. ?Crutches. ?Turn on the lights wh

## 2021-06-20 ENCOUNTER — Ambulatory Visit (INDEPENDENT_AMBULATORY_CARE_PROVIDER_SITE_OTHER): Payer: Medicare Other | Admitting: Internal Medicine

## 2021-06-20 ENCOUNTER — Ambulatory Visit: Payer: Medicare Other | Admitting: Internal Medicine

## 2021-06-20 ENCOUNTER — Encounter: Payer: Self-pay | Admitting: Internal Medicine

## 2021-06-20 VITALS — BP 122/84 | HR 59 | Temp 96.8°F | Wt 313.0 lb

## 2021-06-20 DIAGNOSIS — R269 Unspecified abnormalities of gait and mobility: Secondary | ICD-10-CM

## 2021-06-20 DIAGNOSIS — R42 Dizziness and giddiness: Secondary | ICD-10-CM

## 2021-06-20 DIAGNOSIS — R7303 Prediabetes: Secondary | ICD-10-CM

## 2021-06-20 DIAGNOSIS — K219 Gastro-esophageal reflux disease without esophagitis: Secondary | ICD-10-CM

## 2021-06-20 DIAGNOSIS — G4733 Obstructive sleep apnea (adult) (pediatric): Secondary | ICD-10-CM

## 2021-06-20 DIAGNOSIS — M159 Polyosteoarthritis, unspecified: Secondary | ICD-10-CM

## 2021-06-20 DIAGNOSIS — F325 Major depressive disorder, single episode, in full remission: Secondary | ICD-10-CM

## 2021-06-20 DIAGNOSIS — I10 Essential (primary) hypertension: Secondary | ICD-10-CM | POA: Diagnosis not present

## 2021-06-20 DIAGNOSIS — E7800 Pure hypercholesterolemia, unspecified: Secondary | ICD-10-CM

## 2021-06-20 DIAGNOSIS — N1831 Chronic kidney disease, stage 3a: Secondary | ICD-10-CM

## 2021-06-20 DIAGNOSIS — M5412 Radiculopathy, cervical region: Secondary | ICD-10-CM

## 2021-06-20 DIAGNOSIS — H3530 Unspecified macular degeneration: Secondary | ICD-10-CM

## 2021-06-20 NOTE — Progress Notes (Signed)
Subjective:    Patient ID: Lindsey Jacobs, female    DOB: July 09, 1935, 86 y.o.   MRN: [id number]  HPI  Patient presents to clinic today for 44-month follow-up of chronic conditions.  HTN: Her BP today is 122/84.  She is taking Triamterene HCT as prescribed.  There is no ECG on file.  HLD: Her last LDL was 106, triglycerides 91, 11/2020.  She is not taking any cholesterol-lowering medication at this time.  She tries to consume a low-fat diet.  CKD: Her last creatinine was 1.22, GFR 44, 11/2020.  She is not on an ACEI/ARB at this time.  She does not follow with nephrology.  Prediabetes: Her last A1c was 5.6%, 11/2020.  She is not taking any oral diabetic medication at this time.  She does not check her sugars.  GERD: She is not sure what triggers this.  She denies breakthrough on Pantoprazole.  Upper GI from 05/2019 reviewed.  OSA: She averages 10 hours of sleep per night with the use of her CPAP.  There is no sleep study on file.  Depression: Chronic, managed on Duloxetine.  She is not currently seeing a therapist.  She denies anxiety, SI/HI.  Macular Degeneration: She is taking eyedrops as prescribed by ophthalmology.  OA: Mainly in her back, shoulders and ankles.  She is taking Duloxetine and Naproxen as prescribed with good relief of symptoms.  She does not follow with orthopedics.  Vertigo: Intermittent.  She takes Meclizine as needed with good relief of symptoms.  She does not follow with neurology.  Review of Systems     Past Medical History:  Diagnosis Date   Allergy    Colon polyps    Depression    GERD (gastroesophageal reflux disease)    Glaucoma    Hyperlipidemia    Hypertension    Sleep apnea    Urinary incontinence     Current Outpatient Medications  Medication Sig Dispense Refill   Carboxymeth-Glyc-Polysorb PF (REFRESH DIGITAL PF) 0.5-1-0.5 % SOLN Apply to eye.     Cholecalciferol (VITAMIN D3 MAXIMUM STRENGTH PO) Take by mouth.     dorzolamide-timolol  (COSOPT) 22.3-6.8 MG/ML ophthalmic solution      DULoxetine (CYMBALTA) 60 MG capsule TAKE 1 CAPSULE DAILY (NEED APPOINTMENT IN OCTOBER) 90 capsule 1   latanoprost (XALATAN) 0.005 % ophthalmic solution 1 drop at bedtime.     Magnesium Oxide 500 MG TABS Take by mouth. Patient is taking 2 tablet once daily.     meclizine (ANTIVERT) 12.5 MG tablet Take 1 tablet (12.5 mg total) by mouth 3 (three) times daily as needed for dizziness. 90 tablet 1   Multiple Vitamins-Minerals (ADVANCED EYE HEALTH PO) Take by mouth.     naproxen (NAPROSYN) 500 MG tablet Take 1 tablet (500 mg total) by mouth daily as needed for moderate pain. Take with meal 90 tablet 1   pantoprazole (PROTONIX) 40 MG tablet Take 1 tablet (40 mg total) by mouth daily. 90 tablet 3   triamcinolone cream (KENALOG) 0.1 % Apply 1 application topically 2 (two) times daily. 30 g 0   triamterene-hydrochlorothiazide (MAXZIDE-25) 37.5-25 MG tablet TAKE 1 TABLET DAILY 90 tablet 1   No current facility-administered medications for this visit.    Allergies  Allergen Reactions   Amoxapine And Related     Hives   Diamox [Acetazolamide]    Keflex [Cephalexin] Rash   Tape Rash    Family History  Problem Relation Age of Onset   Heart disease Father  Colon polyps Father    Alcohol abuse Father    Breast cancer Neg Hx     Social History   Socioeconomic History   Marital status: Widowed    Spouse name: Not on file   Number of children: Not on file   Years of education: Not on file   Highest education level: Not on file  Occupational History   Occupation: retired  Tobacco Use   Smoking status: Never   Smokeless tobacco: Never  Vaping Use   Vaping Use: Never used  Substance and Sexual Activity   Alcohol use: Never   Drug use: Never   Sexual activity: Not Currently  Other Topics Concern   Not on file  Social History Narrative   Not on file   Social Determinants of Health   Financial Resource Strain: Low Risk    Difficulty of  Paying Living Expenses: Not hard at all  Food Insecurity: No Food Insecurity   Worried About Charity fundraiser in the Last Year: Never true   [place] in the Last Year: Never true  Transportation Needs: No Transportation Needs   Lack of Transportation (Medical): No   Lack of Transportation (Non-Medical): No  Physical Activity: Inactive   Days of Exercise per Week: 0 days   Minutes of Exercise per Session: 0 min  Stress: No Stress Concern Present   Feeling of Stress : Not at all  Social Connections: Socially Isolated   Frequency of Communication with Friends and Family: More than three times a week   Frequency of Social Gatherings with Friends and Family: Once a week   Attends Religious Services: Never   Marine scientist or Organizations: No   Attends Archivist Meetings: Never   Marital Status: Widowed  Human resources officer Violence: Not At Risk   Fear of Current or Ex-Partner: No   Emotionally Abused: No   Physically Abused: No   Sexually Abused: No     Constitutional: Denies fever, malaise, fatigue, headache or abrupt weight changes.  HEENT: Denies eye pain, eye redness, ear pain, ringing in the ears, wax buildup, runny nose, nasal congestion, bloody nose, or sore throat. Respiratory: Denies difficulty breathing, shortness of breath, cough or sputum production.   Cardiovascular: Denies chest pain, chest tightness, palpitations or swelling in the hands or feet.  Gastrointestinal: Denies abdominal pain, bloating, constipation, diarrhea or blood in the stool.  GU: Pt reports urinary incontinence. Denies urgency, frequency, pain with urination, burning sensation, blood in urine, odor or discharge. Musculoskeletal: Patient reports joint pain, difficulty with gait.  Denies decrease in range of motion, muscle pain or joint swelling.  Skin: Denies redness, rashes, lesions or ulcercations.  Neurological: Patient reports intermittent dizziness.  Denies difficulty with  memory, difficulty with speech or problems with balance and coordination.  Psych: Patient has a history of depression.  Denies anxiety, SI/HI.  No other specific complaints in a complete review of systems (except as listed in HPI above).  Objective:   Physical Exam BP 122/84 (BP Location: Left Wrist, Patient Position: Sitting, Cuff Size: Normal)   Pulse (!) 59   Temp (!) 96.8 F (36 C) (Temporal)   Wt (!) 313 lb (142 kg)   LMP  (LMP Unknown)   SpO2 98%   BMI 52.09 kg/m   Wt Readings from Last 3 Encounters:  05/13/21 (!) 316 lb (143.3 kg)  02/15/21 (!) 316 lb (143.3 kg)  11/29/20 (!) 311 lb (141.1 kg)  General: Appears her stated age, obese, in NAD. Skin: Warm, dry and intact. HEENT: Head: normal shape and size; Eyes: sclera white, PERRLA and EOMs intact; Cardiovascular: Normal rate and rhythm. S1,S2 noted.  No murmur, rubs or gallops noted. No JVD or BLE edema. No carotid bruits noted. Pulmonary/Chest: Normal effort and positive vesicular breath sounds. No respiratory distress. No wheezes, rales or ronchi noted.  Abdomen: Soft and nontender. Normal bowel sounds.  Musculoskeletal: In wheelchair.  No difficulty with gait.  Neurological: Alert and oriented. Cranial nerves II-XII grossly intact. Coordination normal.  Psychiatric: Mood and affect normal. Behavior is normal. Judgment and thought content normal.     BMET    Component Value Date/Time   NA 141 11/29/2020 0916   K 4.1 11/29/2020 0916   CL 104 11/29/2020 0916   CO2 28 11/29/2020 0916   GLUCOSE 95 11/29/2020 0916   BUN 20 11/29/2020 0916   CREATININE 1.22 (H) 11/29/2020 0916   CALCIUM 9.5 11/29/2020 0916   GFRNONAA 54 (L) 06/25/2019 1139   GFRAA 63 06/25/2019 1139    Lipid Panel     Component Value Date/Time   CHOL 183 11/29/2020 0916   TRIG 91 11/29/2020 0916   HDL 58 11/29/2020 0916   CHOLHDL 3.2 11/29/2020 0916   LDLCALC 106 (H) 11/29/2020 0916    CBC    Component Value Date/Time   WBC 8.0  06/25/2019 1139   RBC 4.96 06/25/2019 1139   HGB 14.0 06/25/2019 1139   HCT 42.4 06/25/2019 1139   PLT 244 06/25/2019 1139   MCV 85.5 06/25/2019 1139   MCH 28.2 06/25/2019 1139   MCHC 33.0 06/25/2019 1139   RDW 14.8 06/25/2019 1139   LYMPHSABS 1,592 06/25/2019 1139   EOSABS 520 (H) 06/25/2019 1139   BASOSABS 48 06/25/2019 1139    Hgb A1C Lab Results  Component Value Date   HGBA1C 5.6 11/29/2020            Assessment & Plan:   Difficulty with Gait:  Patient requesting home health PT  RTC in 6 months, follow-up chronic conditions Webb Silversmith, NP

## 2021-06-20 NOTE — Assessment & Plan Note (Signed)
Controlled on Triamterene HCT Reinforced DASH diet C-Met today We will monitor

## 2021-06-20 NOTE — Assessment & Plan Note (Signed)
Advised her she could take Tylenol arthritis daily as needed Continue Duloxetine

## 2021-06-20 NOTE — Assessment & Plan Note (Signed)
C-Met today Advised her to avoid NSAIDs OTC

## 2021-06-20 NOTE — Assessment & Plan Note (Signed)
Encourage diet and exercise for weight loss 

## 2021-06-20 NOTE — Patient Instructions (Signed)
Heart-Healthy Eating Plan Heart-healthy meal planning includes: Eating less unhealthy fats. Eating more healthy fats. Making other changes in your diet. Talk with your doctor or a diet specialist (dietitian) to create an eating plan that is right for you. What is my plan? Your doctor may recommend an eating plan that includes: Total fat: ______% or less of total calories a day. Saturated fat: ______% or less of total calories a day. Cholesterol: less than _________mg a day. What are tips for following this plan? Cooking Avoid frying your food. Try to bake, boil, grill, or broil it instead. You can also reduce fat by: Removing the skin from poultry. Removing all visible fats from meats. Steaming vegetables in water or broth. Meal planning  At meals, divide your plate into four equal parts: Fill one-half of your plate with vegetables and green salads. Fill one-fourth of your plate with whole grains. Fill one-fourth of your plate with lean protein foods. Eat 4-5 servings of vegetables per day. A serving of vegetables is: 1 cup of raw or cooked vegetables. 2 cups of raw leafy greens. Eat 4-5 servings of fruit per day. A serving of fruit is: 1 medium whole fruit.  cup of dried fruit.  cup of fresh, frozen, or canned fruit.  cup of 100% fruit juice. Eat more foods that have soluble fiber. These are apples, broccoli, carrots, beans, peas, and barley. Try to get 20-30 g of fiber per day. Eat 4-5 servings of nuts, legumes, and seeds per week: 1 serving of dried beans or legumes equals  cup after being cooked. 1 serving of nuts is  cup. 1 serving of seeds equals 1 tablespoon. General information Eat more home-cooked food. Eat less restaurant, buffet, and fast food. Limit or avoid alcohol. Limit foods that are high in starch and sugar. Avoid fried foods. Lose weight if you are overweight. Keep track of how much salt (sodium) you eat. This is important if you have high blood  pressure. Ask your doctor to tell you more about this. Try to add vegetarian meals each week. Fats Choose healthy fats. These include olive oil and canola oil, flaxseeds, walnuts, almonds, and seeds. Eat more omega-3 fats. These include salmon, mackerel, sardines, tuna, flaxseed oil, and ground flaxseeds. Try to eat fish at least 2 times each week. Check food labels. Avoid foods with trans fats or high amounts of saturated fat. Limit saturated fats. These are often found in animal products, such as meats, butter, and cream. These are also found in plant foods, such as palm oil, palm kernel oil, and coconut oil. Avoid foods with partially hydrogenated oils in them. These have trans fats. Examples are stick margarine, some tub margarines, cookies, crackers, and other baked goods. What foods can I eat? Fruits All fresh, canned (in natural juice), or frozen fruits. Vegetables Fresh or frozen vegetables (raw, steamed, roasted, or grilled). Green salads. Grains Most grains. Choose whole wheat and whole grains most of the time. Rice and pasta, including brown rice and pastas made with whole wheat. Meats and other proteins Lean, well-trimmed beef, veal, pork, and lamb. Chicken and turkey without skin. All fish and shellfish. Wild duck, rabbit, pheasant, and venison. Egg whites or low-cholesterol egg substitutes. Dried beans, peas, lentils, and tofu. Seeds and most nuts. Dairy Low-fat or nonfat cheeses, including ricotta and mozzarella. Skim or 1% milk that is liquid, powdered, or evaporated. Buttermilk that is made with low-fat milk. Nonfat or low-fat yogurt. Fats and oils Non-hydrogenated (trans-free) margarines. Vegetable oils, including   soybean, sesame, sunflower, olive, peanut, safflower, corn, canola, and cottonseed. Salad dressings or mayonnaise made with a vegetable oil. Beverages Mineral water. Coffee and tea. Diet carbonated beverages. Sweets and desserts Sherbet, gelatin, and fruit ice.  Small amounts of dark chocolate. Limit all sweets and desserts. Seasonings and condiments All seasonings and condiments. The items listed above may not be a complete list of foods and drinks you can eat. Contact a dietitian for more options. What foods should I avoid? Fruits Canned fruit in heavy syrup. Fruit in cream or butter sauce. Fried fruit. Limit coconut. Vegetables Vegetables cooked in cheese, cream, or butter sauce. Fried vegetables. Grains Breads that are made with saturated or trans fats, oils, or whole milk. Croissants. Sweet rolls. Donuts. High-fat crackers, such as cheese crackers. Meats and other proteins Fatty meats, such as hot dogs, ribs, sausage, bacon, rib-eye roast or steak. High-fat deli meats, such as salami and bologna. Caviar. Domestic duck and goose. Organ meats, such as liver. Dairy Cream, sour cream, cream cheese, and creamed cottage cheese. Whole-milk cheeses. Whole or 2% milk that is liquid, evaporated, or condensed. Whole buttermilk. Cream sauce or high-fat cheese sauce. Yogurt that is made from whole milk. Fats and oils Meat fat, or shortening. Cocoa butter, hydrogenated oils, palm oil, coconut oil, palm kernel oil. Solid fats and shortenings, including bacon fat, salt pork, lard, and butter. Nondairy cream substitutes. Salad dressings with cheese or sour cream. Beverages Regular sodas and juice drinks with added sugar. Sweets and desserts Frosting. Pudding. Cookies. Cakes. Pies. Milk chocolate or white chocolate. Buttered syrups. Full-fat ice cream or ice cream drinks. The items listed above may not be a complete list of foods and drinks to avoid. Contact a dietitian for more information. Summary Heart-healthy meal planning includes eating less unhealthy fats, eating more healthy fats, and making other changes in your diet. Eat a balanced diet. This includes fruits and vegetables, low-fat or nonfat dairy, lean protein, nuts and legumes, whole grains, and  heart-healthy oils and fats. This information is not intended to replace advice given to you by your health care provider. Make sure you discuss any questions you have with your health care provider. Document Revised: 05/13/2020 Document Reviewed: 05/13/2020 Elsevier Patient Education  2022 [employer]  

## 2021-06-20 NOTE — Assessment & Plan Note (Signed)
Continue Meclizine as needed 

## 2021-06-20 NOTE — Assessment & Plan Note (Signed)
C-Met and lipid profile today Encouraged her to consume a low-fat diet 

## 2021-06-20 NOTE — Assessment & Plan Note (Signed)
Continue eyedrops per ophthalmology 

## 2021-06-20 NOTE — Assessment & Plan Note (Signed)
Avoid foods that trigger reflux Encourage weight loss as this can produce reflux symptoms Continue Pantoprazole

## 2021-06-20 NOTE — Assessment & Plan Note (Signed)
Continue Duloxetine Support offered Support offered

## 2021-06-20 NOTE — Assessment & Plan Note (Signed)
Encourage weight loss as this can help reduce sleep apnea symptoms Continue CPAP use 

## 2021-06-20 NOTE — Assessment & Plan Note (Signed)
Advised her she could take Tylenol arthritis daily as needed Continue Duloxetine 

## 2021-06-20 NOTE — Assessment & Plan Note (Signed)
A1c today Encouraged her to consume a low-carb diet 

## 2021-06-21 LAB — COMPLETE METABOLIC PANEL WITH GFR
AG Ratio: 1.5 (calc) (ref 1.0–2.5)
ALT: 7 U/L (ref 6–29)
AST: 12 U/L (ref 10–35)
Albumin: 3.8 g/dL (ref 3.6–5.1)
Alkaline phosphatase (APISO): 87 U/L (ref 37–153)
BUN/Creatinine Ratio: 16 (calc) (ref 6–22)
BUN: 18 mg/dL (ref 7–25)
CO2: 26 mmol/L (ref 20–32)
Calcium: 9.5 mg/dL (ref 8.6–10.4)
Chloride: 101 mmol/L (ref 98–110)
Creat: 1.11 mg/dL — ABNORMAL HIGH (ref 0.60–0.95)
Globulin: 2.6 g/dL (calc) (ref 1.9–3.7)
Glucose, Bld: 103 mg/dL (ref 65–139)
Potassium: 3.9 mmol/L (ref 3.5–5.3)
Sodium: 137 mmol/L (ref 135–146)
Total Bilirubin: 0.7 mg/dL (ref 0.2–1.2)
Total Protein: 6.4 g/dL (ref 6.1–8.1)
eGFR: 49 mL/min/{1.73_m2} — ABNORMAL LOW (ref >=60)

## 2021-06-21 LAB — CBC
HCT: 41.7 % (ref 35.0–45.0)
Hemoglobin: 13.4 g/dL (ref 11.7–15.5)
MCH: 27.6 pg (ref 27.0–33.0)
MCHC: 32.1 g/dL (ref 32.0–36.0)
MCV: 85.8 fL (ref 80.0–100.0)
MPV: 10.1 fL (ref 7.5–12.5)
Platelets: 238 10*3/uL (ref 140–400)
RBC: 4.86 10*6/uL (ref 3.80–5.10)
RDW: 14.6 % (ref 11.0–15.0)
WBC: 6.5 10*3/uL (ref 3.8–10.8)

## 2021-06-21 LAB — HEMOGLOBIN A1C
Hgb A1c MFr Bld: 5.6 % of total Hgb (ref <5.7)
Mean Plasma Glucose: 114 mg/dL
eAG (mmol/L): 6.3 mmol/L

## 2021-06-21 LAB — LIPID PANEL
Cholesterol: 171 mg/dL (ref <200)
HDL: 55 mg/dL (ref >=50)
LDL Cholesterol (Calc): 97 mg/dL (calc)
Non-HDL Cholesterol (Calc): 116 mg/dL (calc) (ref <130)
Total CHOL/HDL Ratio: 3.1 (calc) (ref <5.0)
Triglycerides: 92 mg/dL (ref <150)

## 2021-06-22 ENCOUNTER — Telehealth: Payer: Self-pay

## 2021-06-22 NOTE — Telephone Encounter (Signed)
Copied from CRM [id number]. Topic: General - Other >> Jun 22, 2021 10:59 AM Gaetana Michaelis A wrote: Reason for CRM: Tiffany with [facility] has called to report a delay in start of care  The patient would like to be seen for Physical Therapy on Monday 06/27/21

## 2021-06-22 NOTE — Telephone Encounter (Signed)
FYI

## 2021-06-23 NOTE — Telephone Encounter (Signed)
That is fine 

## 2021-06-28 ENCOUNTER — Telehealth: Payer: Self-pay | Admitting: Internal Medicine

## 2021-06-28 ENCOUNTER — Telehealth: Payer: Self-pay

## 2021-06-28 NOTE — Telephone Encounter (Unsigned)
Copied from [place] [id number]. Topic: General - Inquiry >> Jun 28, 2021 12:08 PM Erskine Squibb wrote: Reason for CRM: Elder Cyphers with [facility] would like verbal orders for patient for physical therapy for twice a week for 4 weeks and then once a week for 5 weeks. Her voice mail at [phone] is secure. Please address further

## 2021-06-29 NOTE — Telephone Encounter (Signed)
Lynn advised.   Thanks,   -Terrence Pizana  

## 2021-06-29 NOTE — Telephone Encounter (Signed)
Okay for verbal orders per request 

## 2021-07-26 ENCOUNTER — Other Ambulatory Visit: Payer: Self-pay | Admitting: Internal Medicine

## 2021-07-26 DIAGNOSIS — I10 Essential (primary) hypertension: Secondary | ICD-10-CM

## 2021-07-26 NOTE — Telephone Encounter (Signed)
Requested Prescriptions  Pending Prescriptions Disp Refills  . triamterene-hydrochlorothiazide (MAXZIDE-25) 37.5-25 MG tablet [Pharmacy Med Name: TRIAMTERENE/HYDROCHLOROTHIAZIDE TAB 37.5/25MG ] 90 tablet 1    Sig: TAKE 1 TABLET DAILY     Cardiovascular: Diuretic Combos Failed - 07/26/2021  3:21 AM      Failed - Cr in normal range and within 180 days    Creat  Date Value Ref Range Status  06/20/2021 1.11 (H) 0.60 - 0.95 mg/dL Final         Passed - K in normal range and within 180 days    Potassium  Date Value Ref Range Status  06/20/2021 3.9 3.5 - 5.3 mmol/L Final         Passed - Na in normal range and within 180 days    Sodium  Date Value Ref Range Status  06/20/2021 137 135 - 146 mmol/L Final         Passed - Last BP in normal range    BP Readings from Last 1 Encounters:  06/20/21 122/84         Passed - Valid encounter within last 6 months    Recent Outpatient Visits          1 month ago Prediabetes   [facility] [place], Salvadore Oxford, NP   5 months ago Right upper quadrant abdominal mass   [facility] [place], Salvadore Oxford, NP   7 months ago Prediabetes   [facility] [place], Salvadore Oxford, NP   10 months ago Abnormal urine odor   [facility] [place], Salvadore Oxford, NP   1 year ago Rectal pain   [facility] [place], Salvadore Oxford, NP      Future Appointments            In 5 months [facility], Salvadore Oxford, NP [facility], [facility]

## 2021-08-02 ENCOUNTER — Telehealth: Payer: Medicare Other | Admitting: Physician Assistant

## 2021-08-02 ENCOUNTER — Ambulatory Visit: Payer: Self-pay | Admitting: *Deleted

## 2021-08-02 DIAGNOSIS — R3989 Other symptoms and signs involving the genitourinary system: Secondary | ICD-10-CM | POA: Diagnosis not present

## 2021-08-02 MED ORDER — SULFAMETHOXAZOLE-TRIMETHOPRIM 800-160 MG PO TABS: 1.0000 | ORAL_TABLET | Freq: Two times a day (BID) | ORAL | 0 refills | Status: DC

## 2021-08-02 NOTE — Telephone Encounter (Addendum)
Per agent: "Patient states she has a smell and burning when urinating x1w   Please fu w/ patient "   Chief Complaint: Dysuria Symptoms: Burning with urination,frequency, cloudy urine, malodorous, mild back pain, 3/10 Frequency: 1-2 weeks Pertinent Negatives: Patient denies fever Disposition: [] ED /[] Urgent Care (no appt availability in office) / [] Appointment(In office/virtual)/ [x]  [facility]/ [] Home Care/ [] Refused Recommended Disposition /[] [place] [facility]/ []  Follow-up with PCP Additional Notes: Secured [facility] for this afternoon for pt with assist of pt's daughter.Care advise provided  Reason for Disposition  Urinating more frequently than usual (i.e., frequency)  Answer Assessment - Initial Assessment Questions 1. SYMPTOM: "What's the main symptom you're concerned about?" (e.g., frequency, incontinence)     Dysuria 2. ONSET: "When did the start?"     1-2 weeks ago, Burning 5 days ago 3. PAIN: "Is there any pain?" If Yes, ask: "How bad is it?" (Scale: 1-10; mild, moderate, severe)     3/10.  Mild back pain 4. CAUSE: "What do you think is causing the symptoms?"     UTI 5. OTHER SYMPTOMS: "Do you have any other symptoms?" (e.g., blood in urine, fever, flank pain, pain with urination)     Malodorous, frequency,cloudy, burning with urination, lower back pain  Protocols used: Urinary Symptoms-A-AH

## 2021-08-02 NOTE — Progress Notes (Signed)
Virtual Visit Consent   Lindsey Jacobs, you are scheduled for a virtual visit with a [facility] provider today. Just as with appointments in the office, your consent must be obtained to participate. Your consent will be active for this visit and any virtual visit you may have with one of our providers in the next 365 days. If you have a MyChart account, a copy of this consent can be sent to you electronically.  As this is a virtual visit, video technology does not allow for your provider to perform a traditional examination. This may limit your provider's ability to fully assess your condition. If your provider identifies any concerns that need to be evaluated in person or the need to arrange testing (such as labs, EKG, etc.), we will make arrangements to do so. Although advances in technology are sophisticated, we cannot ensure that it will always work on either your end or our end. If the connection with a video visit is poor, the visit may have to be switched to a telephone visit. With either a video or telephone visit, we are not always able to ensure that we have a secure connection.  By engaging in this virtual visit, you consent to the provision of healthcare and authorize for your insurance to be billed (if applicable) for the services provided during this visit. Depending on your insurance coverage, you may receive a charge related to this service.  I need to obtain your verbal consent now. Are you willing to proceed with your visit today? Lindsey Jacobs has provided verbal consent on 08/02/2021 for a virtual visit (video or telephone). [name], [place]  Date: 08/02/2021 1:33 PM  Virtual Visit via Video Note   I, [name], connected with  Lindsey Jacobs  ([id number], Nov 09, 1935) on 08/02/21 at  1:00 PM EDT by a video-enabled telemedicine application and verified that I am speaking with the correct person using two identifiers.  Location: Patient: Virtual Visit Location Patient:  Home Provider: Virtual Visit Location Provider: Home Office   I discussed the limitations of evaluation and management by telemedicine and the availability of in person appointments. The patient expressed understanding and agreed to proceed.    History of Present Illness: Lindsey Jacobs is a 86 y.o. who identifies as a female who was assigned female at birth, and is being seen today for possible UTI. Endorses symptoms starting about a week ago, noting mild urinary urgency, frequency and dysuria. Notes some fatigue. Notes now urine with a very strong odor and cloudy. Denies fever, chills. Some low back pain noted. Denies blood in the urine. Denies confusion. Notes history of UTI but has not had one since last fall per her report.   HPI: HPI  Problems:  Patient Active Problem List   Diagnosis Date Noted   Cervical radiculitis 02/15/2021   CKD (chronic kidney disease) stage 3, GFR 30-59 ml/min (HCC) 09/14/2020   HLD (hyperlipidemia) 09/14/2020   Morbid obesity (HCC) 09/14/2020   Vertigo 08/18/2019   Prediabetes 05/14/2019   AMD (age-related macular degeneration), bilateral 04/26/2018   Major depression, single episode, in complete remission (HCC) 02/12/2018   Gastroesophageal reflux disease without esophagitis 02/12/2018   Essential hypertension 01/02/2018   Osteoarthritis 01/02/2018   Obstructive sleep apnea syndrome 01/02/2018    Allergies:  Allergies  Allergen Reactions   Amoxapine And Related     Hives   Diamox [Acetazolamide]    Keflex [Cephalexin] Rash   Tape Rash   Medications:  Current Outpatient Medications:  sulfamethoxazole-trimethoprim (BACTRIM DS) 800-160 MG tablet, Take 1 tablet by mouth 2 (two) times daily., Disp: 10 tablet, Rfl: 0   Carboxymeth-Glyc-Polysorb PF (REFRESH DIGITAL PF) 0.5-1-0.5 % SOLN, Apply to eye., Disp: , Rfl:    Cholecalciferol (VITAMIN D3 MAXIMUM STRENGTH PO), Take by mouth., Disp: , Rfl:    cycloSPORINE (RESTASIS) 0.05 % ophthalmic emulsion,  SMARTSIG:In Eye(s), Disp: , Rfl:    dorzolamide-timolol (COSOPT) 22.3-6.8 MG/ML ophthalmic solution, , Disp: , Rfl:    DULoxetine (CYMBALTA) 60 MG capsule, TAKE 1 CAPSULE DAILY (NEED APPOINTMENT IN OCTOBER), Disp: 90 capsule, Rfl: 1   latanoprost (XALATAN) 0.005 % ophthalmic solution, 1 drop at bedtime., Disp: , Rfl:    Magnesium Oxide 500 MG TABS, Take by mouth. Patient is taking 2 tablet once daily., Disp: , Rfl:    meclizine (ANTIVERT) 12.5 MG tablet, Take 1 tablet (12.5 mg total) by mouth 3 (three) times daily as needed for dizziness., Disp: 90 tablet, Rfl: 1   Multiple Vitamins-Minerals (ADVANCED EYE HEALTH PO), Take by mouth., Disp: , Rfl:    naproxen (NAPROSYN) 500 MG tablet, Take 1 tablet (500 mg total) by mouth daily as needed for moderate pain. Take with meal, Disp: 90 tablet, Rfl: 1   pantoprazole (PROTONIX) 40 MG tablet, Take 1 tablet (40 mg total) by mouth daily., Disp: 90 tablet, Rfl: 3   triamterene-hydrochlorothiazide (MAXZIDE-25) 37.5-25 MG tablet, TAKE 1 TABLET DAILY, Disp: 90 tablet, Rfl: 1  Observations/Objective: Patient is well-developed, well-nourished in no acute distress.  Resting comfortably at home.  Head is normocephalic, atraumatic.  No labored breathing. Speech is clear and coherent with logical content.  Patient is alert and oriented at baseline.   Assessment and Plan: 1. Suspected UTI - sulfamethoxazole-trimethoprim (BACTRIM DS) 800-160 MG tablet; Take 1 tablet by mouth 2 (two) times daily.  Dispense: 10 tablet; Refill: 0 - CULTURE, URINE COMPREHENSIVE; Future  Classic UTI symptoms with absence of alarm signs or symptoms. Prior history of UTI. Giving her history and age, culture is recommended. Spoke with PCP who said office lab could handle order if placed. Order for urine culture placed. Patient and daughter aware to take her by office to give sample. Will treat empirically with Bactrim for suspected uncomplicated cystitis. Will adjust treatment based on  culture result and sensitivities. Supportive measures and OTC medications reviewed. Strict in-person evaluation precautions discussed.    Follow Up Instructions: I discussed the assessment and treatment plan with the patient. The patient was provided an opportunity to ask questions and all were answered. The patient agreed with the plan and demonstrated an understanding of the instructions.  A copy of instructions were sent to the patient via MyChart unless otherwise noted below.   The patient was advised to call back or seek an in-person evaluation if the symptoms worsen or if the condition fails to improve as anticipated.  Time:  I spent 8 minutes with the patient via telehealth technology discussing the above problems/concerns.    [name], PA-C

## 2021-08-02 NOTE — Patient Instructions (Signed)
Lindsey Jacobs, thank you for joining [name], PA-C for today's virtual visit.  While this provider is not your primary care provider (PCP), if your PCP is located in our provider database this encounter information will be shared with them immediately following your visit.  Consent: (Patient) Lindsey Jacobs provided verbal consent for this virtual visit at the beginning of the encounter.  Current Medications:  Current Outpatient Medications:    Carboxymeth-Glyc-Polysorb PF (REFRESH DIGITAL PF) 0.5-1-0.5 % SOLN, Apply to eye., Disp: , Rfl:    Cholecalciferol (VITAMIN D3 MAXIMUM STRENGTH PO), Take by mouth., Disp: , Rfl:    cycloSPORINE (RESTASIS) 0.05 % ophthalmic emulsion, SMARTSIG:In Eye(s), Disp: , Rfl:    dorzolamide-timolol (COSOPT) 22.3-6.8 MG/ML ophthalmic solution, , Disp: , Rfl:    DULoxetine (CYMBALTA) 60 MG capsule, TAKE 1 CAPSULE DAILY (NEED APPOINTMENT IN OCTOBER), Disp: 90 capsule, Rfl: 1   latanoprost (XALATAN) 0.005 % ophthalmic solution, 1 drop at bedtime., Disp: , Rfl:    Magnesium Oxide 500 MG TABS, Take by mouth. Patient is taking 2 tablet once daily., Disp: , Rfl:    meclizine (ANTIVERT) 12.5 MG tablet, Take 1 tablet (12.5 mg total) by mouth 3 (three) times daily as needed for dizziness., Disp: 90 tablet, Rfl: 1   Multiple Vitamins-Minerals (ADVANCED EYE HEALTH PO), Take by mouth., Disp: , Rfl:    naproxen (NAPROSYN) 500 MG tablet, Take 1 tablet (500 mg total) by mouth daily as needed for moderate pain. Take with meal, Disp: 90 tablet, Rfl: 1   pantoprazole (PROTONIX) 40 MG tablet, Take 1 tablet (40 mg total) by mouth daily., Disp: 90 tablet, Rfl: 3   triamterene-hydrochlorothiazide (MAXZIDE-25) 37.5-25 MG tablet, TAKE 1 TABLET DAILY, Disp: 90 tablet, Rfl: 1   Medications ordered in this encounter:  No orders of the defined types were placed in this encounter.    *If you need refills on other medications prior to your next appointment, please contact your  pharmacy*  Follow-Up: Call back or seek an in-person evaluation if the symptoms worsen or if the condition fails to improve as anticipated.  Other Instructions Your symptoms are consistent with a bladder infection, also called acute cystitis. Please take your antibiotic (Bactrim) as directed until all pills are gone.  Stay very well hydrated.  Consider a daily probiotic (Align, Culturelle, or Activia) to help prevent stomach upset caused by the antibiotic.  Taking a probiotic daily may also help prevent recurrent UTIs.  Also consider taking AZO (Phenazopyridine) tablets to help decrease pain with urination.  I will call you with your urine testing results.  We will change antibiotics if indicated.  Call or return to clinic if symptoms are not resolved by completion of antibiotic.   Urinary Tract Infection A urinary tract infection (UTI) can occur any place along the urinary tract. The tract includes the kidneys, ureters, bladder, and urethra. A type of germ called bacteria often causes a UTI. UTIs are often helped with antibiotic medicine.  HOME CARE  If given, take antibiotics as told by your doctor. Finish them even if you start to feel better. Drink enough fluids to keep your pee (urine) clear or pale yellow. Avoid tea, drinks with caffeine, and bubbly (carbonated) drinks. Pee often. Avoid holding your pee in for a long time. Pee before and after having sex (intercourse). Wipe from front to back after you poop (bowel movement) if you are a woman. Use each tissue only once. GET HELP RIGHT AWAY IF:  You have back pain. You  have lower belly (abdominal) pain. You have chills. You feel sick to your stomach (nauseous). You throw up (vomit). Your burning or discomfort with peeing does not go away. You have a fever. Your symptoms are not better in 3 days. MAKE SURE YOU:  Understand these instructions. Will watch your condition. Will get help right away if you are not doing well or get  worse. Document Released: 06/21/2007 Document Revised: 09/27/2011 Document Reviewed: 08/03/2011 [facility] Patient Information 2015 [place], [place]. This information is not intended to replace advice given to you by your health care provider. Make sure you discuss any questions you have with your health care provider.    If you have been instructed to have an in-person evaluation today at a local Urgent Care facility, please use the link below. It will take you to a list of all of our available [facility], including address, phone number and hours of operation. Please do not delay care.  [facility]  If you or a family member do not have a primary care provider, use the link below to schedule a visit and establish care. When you choose a [facility] primary care physician or advanced practice provider, you gain a long-term partner in health. Find a Primary Care Provider  Learn more about [facility] in-office and virtual care options: [facility] - Get Care Now

## 2021-08-03 ENCOUNTER — Other Ambulatory Visit: Payer: Self-pay

## 2021-08-03 DIAGNOSIS — R3989 Other symptoms and signs involving the genitourinary system: Secondary | ICD-10-CM

## 2021-08-07 LAB — SPECIMEN STATUS REPORT

## 2021-08-07 LAB — CULTURE, URINE COMPREHENSIVE

## 2021-09-19 ENCOUNTER — Other Ambulatory Visit: Payer: Self-pay | Admitting: Internal Medicine

## 2021-09-19 DIAGNOSIS — F324 Major depressive disorder, single episode, in partial remission: Secondary | ICD-10-CM

## 2021-09-21 NOTE — Telephone Encounter (Signed)
Requested Prescriptions  Pending Prescriptions Disp Refills  . DULoxetine (CYMBALTA) 60 MG capsule [Pharmacy Med Name: DULOXETINE HCL DR CAPS 60MG] 90 capsule 0    Sig: TAKE 1 CAPSULE DAILY (NEED APPOINTMENT IN OCTOBER)     Psychiatry: Antidepressants - SNRI - duloxetine Failed - 09/19/2021  3:28 AM      Failed - Cr in normal range and within 360 days    Creat  Date Value Ref Range Status  06/20/2021 1.11 (H) 0.60 - 0.95 mg/dL Final         Passed - eGFR is 30 or above and within 360 days    GFR, Est African American  Date Value Ref Range Status  06/25/2019 63 > OR = 60 mL/min/1.73m2 Final   GFR, Est Non African American  Date Value Ref Range Status  06/25/2019 54 (L) > OR = 60 mL/min/1.73m2 Final   eGFR  Date Value Ref Range Status  06/20/2021 49 (L) > OR = 60 mL/min/1.73m2 Final    Comment:    The eGFR is based on the CKD-EPI 2021 equation. To calculate  the new eGFR from a previous Creatinine or Cystatin C result, go to https://www.kidney.org/professionals/ kdoqi/gfr%5Fcalculator          Passed - Completed PHQ-2 or PHQ-9 in the last 360 days      Passed - Last BP in normal range    BP Readings from Last 1 Encounters:  06/20/21 122/84         Passed - Valid encounter within last 6 months    Recent Outpatient Visits          3 months ago Prediabetes   [facility] Baity, Regina W, NP   7 months ago Right upper quadrant abdominal mass   [facility] Baity, Regina W, NP   9 months ago Prediabetes   [facility] Baity, Regina W, NP   1 year ago Abnormal urine odor   [facility] Baity, Regina W, NP   1 year ago Rectal pain   [facility] Baity, Regina W, NP      Future Appointments            In 3 months Baity, Regina W, NP [facility]            

## 2021-11-07 ENCOUNTER — Ambulatory Visit: Payer: Medicare Other | Admitting: Family Medicine

## 2021-11-09 ENCOUNTER — Ambulatory Visit: Payer: Medicare Other | Admitting: Internal Medicine

## 2021-11-15 ENCOUNTER — Ambulatory Visit: Payer: Medicare Other | Admitting: Internal Medicine

## 2021-11-19 ENCOUNTER — Encounter: Payer: Self-pay | Admitting: Internal Medicine

## 2021-11-19 DIAGNOSIS — K219 Gastro-esophageal reflux disease without esophagitis: Secondary | ICD-10-CM

## 2021-11-21 MED ORDER — PANTOPRAZOLE SODIUM 40 MG PO TBEC: 40.0000 mg | DELAYED_RELEASE_TABLET | Freq: Every day | ORAL | 1 refills | Status: DC

## 2021-11-22 ENCOUNTER — Encounter: Payer: Self-pay | Admitting: Internal Medicine

## 2021-11-22 ENCOUNTER — Ambulatory Visit (INDEPENDENT_AMBULATORY_CARE_PROVIDER_SITE_OTHER): Payer: Medicare Other | Admitting: Internal Medicine

## 2021-11-22 VITALS — BP 139/87 | HR 77 | Temp 96.8°F | Wt 305.0 lb

## 2021-11-22 DIAGNOSIS — N1831 Chronic kidney disease, stage 3a: Secondary | ICD-10-CM | POA: Diagnosis not present

## 2021-11-22 DIAGNOSIS — G8929 Other chronic pain: Secondary | ICD-10-CM

## 2021-11-22 DIAGNOSIS — M542 Cervicalgia: Secondary | ICD-10-CM | POA: Diagnosis not present

## 2021-11-22 DIAGNOSIS — R202 Paresthesia of skin: Secondary | ICD-10-CM

## 2021-11-22 DIAGNOSIS — H93A3 Pulsatile tinnitus, bilateral: Secondary | ICD-10-CM | POA: Diagnosis not present

## 2021-11-22 DIAGNOSIS — M25512 Pain in left shoulder: Secondary | ICD-10-CM

## 2021-11-22 DIAGNOSIS — I10 Essential (primary) hypertension: Secondary | ICD-10-CM | POA: Diagnosis not present

## 2021-11-22 MED ORDER — OLMESARTAN MEDOXOMIL 20 MG PO TABS: 20.0000 mg | ORAL_TABLET | Freq: Every day | ORAL | 0 refills | Status: DC

## 2021-11-22 MED ORDER — FUROSEMIDE 20 MG PO TABS: 20.0000 mg | ORAL_TABLET | Freq: Every day | ORAL | 0 refills | Status: DC

## 2021-11-22 MED ORDER — LOSARTAN POTASSIUM 25 MG PO TABS: 25.0000 mg | ORAL_TABLET | Freq: Every day | ORAL | 0 refills | Status: DC

## 2021-11-22 NOTE — Progress Notes (Signed)
Subjective:    Patient ID: Lindsey Jacobs, female    DOB: 20-Nov-1935, 86 y.o.   MRN: [id number]  HPI  Patient presents to clinic today with complaint of sensation of hearing a heart beat in her ears as well as feeling pulsating in her fingertips.  She noticed this a few months ago. She reports it occurs mostly at night but does occur on a daily basis.  She denies runny nose, nasal congestion or ear pain.  She does have a history of chronic neck and left shoulder pain.  She has seen orthopedics for this in the past and was told that she needs a left Tatar cuff repair but she declined surgical intervention.  She has received a steroid injection in the left shoulder.  She reports associated numbness in her bilateral fingers, L >R.    Of note, her BP today is 148/89.  She is taking triamterene HCTZ as prescribed.  She does have a history of CKD, last creatinine 1.111, GFR 49, 06/2021.  Review of Systems  Past Medical History:  Diagnosis Date   Allergy    Colon polyps    Depression    GERD (gastroesophageal reflux disease)    Glaucoma    Hyperlipidemia    Hypertension    Sleep apnea    Urinary incontinence     Current Outpatient Medications  Medication Sig Dispense Refill   Carboxymeth-Glyc-Polysorb PF (REFRESH DIGITAL PF) 0.5-1-0.5 % SOLN Apply to eye.     Cholecalciferol (VITAMIN D3 MAXIMUM STRENGTH PO) Take by mouth.     cycloSPORINE (RESTASIS) 0.05 % ophthalmic emulsion SMARTSIG:In Eye(s)     dorzolamide-timolol (COSOPT) 22.3-6.8 MG/ML ophthalmic solution      DULoxetine (CYMBALTA) 60 MG capsule TAKE 1 CAPSULE DAILY (NEED APPOINTMENT IN OCTOBER) 90 capsule 0   latanoprost (XALATAN) 0.005 % ophthalmic solution 1 drop at bedtime.     Magnesium Oxide 500 MG TABS Take by mouth. Patient is taking 2 tablet once daily.     meclizine (ANTIVERT) 12.5 MG tablet Take 1 tablet (12.5 mg total) by mouth 3 (three) times daily as needed for dizziness. 90 tablet 1   Multiple Vitamins-Minerals  (ADVANCED EYE HEALTH PO) Take by mouth.     naproxen (NAPROSYN) 500 MG tablet Take 1 tablet (500 mg total) by mouth daily as needed for moderate pain. Take with meal 90 tablet 1   pantoprazole (PROTONIX) 40 MG tablet Take 1 tablet (40 mg total) by mouth daily. 90 tablet 1   sulfamethoxazole-trimethoprim (BACTRIM DS) 800-160 MG tablet Take 1 tablet by mouth 2 (two) times daily. 10 tablet 0   triamterene-hydrochlorothiazide (MAXZIDE-25) 37.5-25 MG tablet TAKE 1 TABLET DAILY 90 tablet 1   No current facility-administered medications for this visit.    Allergies  Allergen Reactions   Amoxapine And Related     Hives   Diamox [Acetazolamide]    Keflex [Cephalexin] Rash   Tape Rash    Family History  Problem Relation Age of Onset   Heart disease Father    Colon polyps Father    Alcohol abuse Father    Breast cancer Neg Hx     Social History   Socioeconomic History   Marital status: Widowed    Spouse name: Not on file   Number of children: Not on file   Years of education: Not on file   Highest education level: Not on file  Occupational History   Occupation: retired  Tobacco Use   Smoking status: Never   Smokeless  tobacco: Never  Vaping Use   Vaping Use: Never used  Substance and Sexual Activity   Alcohol use: Never   Drug use: Never   Sexual activity: Not Currently  Other Topics Concern   Not on file  Social History Narrative   Not on file   Social Determinants of Health   Financial Resource Strain: Low Risk  (05/13/2021)   Overall Financial Resource Strain (CARDIA)    Difficulty of Paying Living Expenses: Not hard at all  Food Insecurity: No Food Insecurity (05/13/2021)   Hunger Vital Sign    Worried About Running Out of Food in the Last Year: Never true    Ran Out of Food in the Last Year: Never true  Transportation Needs: No Transportation Needs (05/13/2021)   PRAPARE - Administrator, Civil Service (Medical): No    Lack of Transportation  (Non-Medical): No  Physical Activity: Inactive (05/13/2021)   Exercise Vital Sign    Days of Exercise per Week: 0 days    Minutes of Exercise per Session: 0 min  Stress: No Stress Concern Present (05/13/2021)   [employer] of Occupational Health - Occupational Stress Questionnaire    Feeling of Stress : Not at all  Social Connections: Socially Isolated (05/13/2021)   Social Connection and Isolation Panel [NHANES]    Frequency of Communication with Friends and Family: More than three times a week    Frequency of Social Gatherings with Friends and Family: Once a week    Attends Religious Services: Never    Database administrator or Organizations: No    Attends Banker Meetings: Never    Marital Status: Widowed  Intimate Partner Violence: Not At Risk (05/13/2021)   Humiliation, Afraid, Rape, and Kick questionnaire    Fear of Current or Ex-Partner: No    Emotionally Abused: No    Physically Abused: No    Sexually Abused: No     Constitutional: Denies fever, malaise, fatigue, headache or abrupt weight changes.  HEENT: Pt reports pulsating sensation in ears. Denies eye pain, eye redness, ear pain, ringing in the ears, wax buildup, runny nose, nasal congestion, bloody nose, or sore throat. Respiratory: Denies difficulty breathing, shortness of breath, cough or sputum production.   Cardiovascular: Patient reports palpitations, swelling in ankles.  Denies chest pain, chest tightness, or swelling in the hands or feet.  Musculoskeletal: Pt reports chronic neck pain, left shoulder pain, decreased range of motion. Denies decrease in range of motion, difficulty with gait, muscle pain or joint pain and swelling.  Skin: Denies redness, rashes, lesions or ulcercations.  Neurological: Pt reports pulsating sensation in finger tips, numbness of fingertips. Denies dizziness, difficulty with memory, difficulty with speech.   No other specific complaints in a complete review of systems  (except as listed in HPI above).     Objective:   Physical Exam BP 139/87 (BP Location: Left Wrist, Patient Position: Sitting, Cuff Size: Small)   Pulse 77   Temp (!) 96.8 F (36 C) (Temporal)   Wt (!) 305 lb (138.3 kg)   LMP  (LMP Unknown)   SpO2 99%   BMI 50.75 kg/m   Wt Readings from Last 3 Encounters:  06/20/21 (!) 313 lb (142 kg)  05/13/21 (!) 316 lb (143.3 kg)  02/15/21 (!) 316 lb (143.3 kg)    General: Appears her stated age, obese, in NAD. Skin: Warm, dry and intact. HEENT: Head: normal shape and size; Eyes: sclera white, no icterus, conjunctiva pink, PERRLA  and EOMs intact;  Cardiovascular: Normal rate and rhythm. S1,S2 noted.  No murmur, rubs or gallops noted. Trace nonpitting BLE edema, R>L. No carotid bruits noted. Pulmonary/Chest: Normal effort and positive vesicular breath sounds. No respiratory distress. No wheezes, rales or ronchi noted.  Musculoskeletal: Normal flexion, extension and rotation of the cervical spine.  No bony tenderness noted over the cervical spine.  Shoulder shrug equal.  Decreased external rotation of the left shoulder.  Normal internal rotation of the left shoulder.  Positive drop can test on left.  Handgrips equal. Neurological: Alert and oriented.  Positive Tinel's on the left.  Negative Phalen's on the left. Coordination normal.     BMET    Component Value Date/Time   NA 137 06/20/2021 0937   K 3.9 06/20/2021 0937   CL 101 06/20/2021 0937   CO2 26 06/20/2021 0937   GLUCOSE 103 06/20/2021 0937   BUN 18 06/20/2021 0937   CREATININE 1.11 (H) 06/20/2021 0937   CALCIUM 9.5 06/20/2021 0937   GFRNONAA 54 (L) 06/25/2019 1139   GFRAA 63 06/25/2019 1139    Lipid Panel     Component Value Date/Time   CHOL 171 06/20/2021 0937   TRIG 92 06/20/2021 0937   HDL 55 06/20/2021 0937   CHOLHDL 3.1 06/20/2021 0937   LDLCALC 97 06/20/2021 0937    CBC    Component Value Date/Time   WBC 6.5 06/20/2021 0937   RBC 4.86 06/20/2021 0937   HGB  13.4 06/20/2021 0937   HCT 41.7 06/20/2021 0937   PLT 238 06/20/2021 0937   MCV 85.8 06/20/2021 0937   MCH 27.6 06/20/2021 0937   MCHC 32.1 06/20/2021 0937   RDW 14.6 06/20/2021 0937   LYMPHSABS 1,592 06/25/2019 1139   EOSABS 520 (H) 06/25/2019 1139   BASOSABS 48 06/25/2019 1139    Hgb A1C Lab Results  Component Value Date   HGBA1C 5.6 06/20/2021            Assessment & Plan:   Pulsating Sensation in the Ears/Fingertips, HTN:  Exam benign She denies chest pain, shortness of breath or palpitations Could be related to elevated blood pressure We will D/C triamterene HCT due to CKD and lack of blood pressure control We will start olmesartan 20 mg daily (unable to take losartan secondary to cough) We will start Lasix 20 mg daily BMet and TSH today  Chronic Neck Pain, Chronic Left Shoulder Pain:  She is currently following with orthopedics at this We will monitor   RTC in 1 month for follow-up of chronic conditions Nicki Reaper, NP

## 2021-11-22 NOTE — Patient Instructions (Signed)
Palpitations Palpitations are feelings that your heartbeat is not normal. Your heartbeat may feel like it is: Uneven (irregular). Faster than normal. Fluttering. Skipping a beat. This is usually not a serious problem. However, a doctor will do tests and check your medical history to make sure that you do not have a serious heart problem. Follow these instructions at home: Watch for any changes in your condition. Tell your doctor about any changes. Take these actions to help manage your symptoms: Eating and drinking Follow instructions from your doctor about things to eat and drink. You may be told to avoid these things: Drinks that have caffeine in them, such as coffee, tea, soft drinks, and energy drinks. Chocolate. Alcohol. Diet pills. Lifestyle     Try to lower your stress. These things can help you relax: Yoga. Deep breathing and meditation. Guided imagery. This is using words and images to create positive thoughts. Exercise, including swimming, jogging, and walking. Tell your doctor if you have more abnormal heartbeats when you are active. If you have chest pain or feel short of breath with exercise, do not keep doing the exercise until you are seen by your doctor. Biofeedback. This is using your mind to control things in your body, such as your heartbeat. Get plenty of rest and sleep. Keep a regular bed time. Do not use drugs, such as cocaine or ecstasy. Do not use marijuana. Do not smoke or use any products that contain nicotine or tobacco. If you need help quitting, ask your doctor. General instructions Take over-the-counter and prescription medicines only as told by your doctor. Keep all follow-up visits. You may need more tests if palpitations do not go away or get worse. Contact a doctor if: You keep having fast or uneven heartbeats for a long time. Your symptoms happen more often. Get help right away if: You have chest pain. You feel short of breath. You have a very  bad headache. You feel dizzy. You faint. These symptoms may be an emergency. Get help right away. Call your local emergency services (911 in the [place] Do not wait to see if the symptoms will go away. Do not drive yourself to the hospital. Summary Palpitations are feelings that your heartbeat is uneven or faster than normal. It may feel like your heart is fluttering or skipping a beat. Avoid food and drinks that may cause this condition. These include caffeine, chocolate, and alcohol. Try to lower your stress. Do not smoke or use drugs. Get help right away if you faint, feel dizzy, feel short of breath, have chest pain, or have a very bad headache. This information is not intended to replace advice given to you by your health care provider. Make sure you discuss any questions you have with your health care provider. Document Revised: 05/26/2020 Document Reviewed: 05/26/2020 Elsevier Patient Education  2023 Elsevier Inc.  

## 2021-11-23 ENCOUNTER — Ambulatory Visit: Payer: Medicare Other | Admitting: Internal Medicine

## 2021-11-23 LAB — BASIC METABOLIC PANEL WITH GFR
BUN/Creatinine Ratio: 19 (calc) (ref 6–22)
BUN: 22 mg/dL (ref 7–25)
CO2: 28 mmol/L (ref 20–32)
Calcium: 9.8 mg/dL (ref 8.6–10.4)
Chloride: 102 mmol/L (ref 98–110)
Creat: 1.17 mg/dL — ABNORMAL HIGH (ref 0.60–0.95)
Glucose, Bld: 91 mg/dL (ref 65–99)
Potassium: 4.2 mmol/L (ref 3.5–5.3)
Sodium: 139 mmol/L (ref 135–146)
eGFR: 46 mL/min/{1.73_m2} — ABNORMAL LOW (ref >=60)

## 2021-11-23 LAB — TSH: TSH: 2.32 mIU/L (ref 0.40–4.50)

## 2021-12-03 ENCOUNTER — Encounter: Payer: Self-pay | Admitting: Internal Medicine

## 2021-12-06 MED ORDER — OLMESARTAN MEDOXOMIL 20 MG PO TABS: 40.0000 mg | ORAL_TABLET | Freq: Every day | ORAL | 0 refills | Status: DC

## 2021-12-12 ENCOUNTER — Encounter: Payer: Self-pay | Admitting: Internal Medicine

## 2021-12-19 ENCOUNTER — Other Ambulatory Visit: Payer: Self-pay | Admitting: Internal Medicine

## 2021-12-19 DIAGNOSIS — F324 Major depressive disorder, single episode, in partial remission: Secondary | ICD-10-CM

## 2021-12-19 NOTE — Telephone Encounter (Signed)
Requested Prescriptions  Pending Prescriptions Disp Refills   DULoxetine (CYMBALTA) 60 MG capsule [Pharmacy Med Name: DULOXETINE HCL DR CAPS 60MG] 90 capsule 0    Sig: TAKE 1 CAPSULE DAILY (NEED APPOINTMENT IN OCTOBER)     Psychiatry: Antidepressants - SNRI - duloxetine Failed - 12/19/2021  2:33 AM      Failed - Cr in normal range and within 360 days    Creat  Date Value Ref Range Status  11/22/2021 1.17 (H) 0.60 - 0.95 mg/dL Final         Passed - eGFR is 30 or above and within 360 days    GFR, Est African American  Date Value Ref Range Status  06/25/2019 63 > OR = 60 mL/min/1.34m Final   GFR, Est Non African American  Date Value Ref Range Status  06/25/2019 54 (L) > OR = 60 mL/min/1.741mFinal   eGFR  Date Value Ref Range Status  11/22/2021 46 (L) > OR = 60 mL/min/1.7332minal         Passed - Completed PHQ-2 or PHQ-9 in the last 360 days      Passed - Last BP in normal range    BP Readings from Last 1 Encounters:  11/22/21 139/87         Passed - Valid encounter within last 6 months    Recent Outpatient Visits           3 weeks ago Primary hypertension   [facility]   6 months ago Prediabetes   [facility]   10 months ago Right upper quadrant abdominal mass   [facility]   1 year ago Prediabetes   [facility]   1 year ago Abnormal urine odor   [facility]       Future Appointments             In 1 week [facility]

## 2021-12-20 ENCOUNTER — Other Ambulatory Visit: Payer: Self-pay | Admitting: Internal Medicine

## 2021-12-20 ENCOUNTER — Encounter: Payer: Self-pay | Admitting: Internal Medicine

## 2021-12-20 MED ORDER — OLMESARTAN MEDOXOMIL 20 MG PO TABS: 40.0000 mg | ORAL_TABLET | Freq: Every day | ORAL | 0 refills | Status: DC

## 2021-12-20 NOTE — Telephone Encounter (Signed)
Requested Prescriptions  Pending Prescriptions Disp Refills   furosemide (LASIX) 20 MG tablet [Pharmacy Med Name: FUROSEMIDE 20MG  TABLETS] 90 tablet 1    Sig: TAKE 1 TABLET(20 MG) BY MOUTH DAILY     Cardiovascular:  Diuretics - Loop Failed - 12/20/2021 10:50 AM      Failed - Cr in normal range and within 180 days    Creat  Date Value Ref Range Status  11/22/2021 1.17 (H) 0.60 - 0.95 mg/dL Final         Failed - Mg Level in normal range and within 180 days    No results found for: "MG"       Passed - K in normal range and within 180 days    Potassium  Date Value Ref Range Status  11/22/2021 4.2 3.5 - 5.3 mmol/L Final         Passed - Ca in normal range and within 180 days    Calcium  Date Value Ref Range Status  11/22/2021 9.8 8.6 - 10.4 mg/dL Final         Passed - Na in normal range and within 180 days    Sodium  Date Value Ref Range Status  11/22/2021 139 135 - 146 mmol/L Final         Passed - Cl in normal range and within 180 days    Chloride  Date Value Ref Range Status  11/22/2021 102 98 - 110 mmol/L Final         Passed - Last BP in normal range    BP Readings from Last 1 Encounters:  11/22/21 139/87         Passed - Valid encounter within last 6 months    Recent Outpatient Visits           4 weeks ago Primary hypertension   [facility] [place], Mullins, NP   6 months ago Prediabetes   [facility] [place], Mullins, NP   10 months ago Right upper quadrant abdominal mass   [facility] [place], Mullins, NP   1 year ago Prediabetes   [facility] [place], Mullins, NP   1 year ago Abnormal urine odor   [facility] [place], Mullins, NP       Future Appointments             In 6 days [place], Mullins, NP [facility]

## 2021-12-26 ENCOUNTER — Encounter: Payer: Self-pay | Admitting: Internal Medicine

## 2021-12-26 ENCOUNTER — Ambulatory Visit (INDEPENDENT_AMBULATORY_CARE_PROVIDER_SITE_OTHER): Payer: Medicare Other | Admitting: Internal Medicine

## 2021-12-26 VITALS — BP 138/77 | HR 75 | Temp 96.8°F | Wt 302.0 lb

## 2021-12-26 DIAGNOSIS — K219 Gastro-esophageal reflux disease without esophagitis: Secondary | ICD-10-CM

## 2021-12-26 DIAGNOSIS — M159 Polyosteoarthritis, unspecified: Secondary | ICD-10-CM

## 2021-12-26 DIAGNOSIS — F325 Major depressive disorder, single episode, in full remission: Secondary | ICD-10-CM

## 2021-12-26 DIAGNOSIS — E7800 Pure hypercholesterolemia, unspecified: Secondary | ICD-10-CM

## 2021-12-26 DIAGNOSIS — R7303 Prediabetes: Secondary | ICD-10-CM

## 2021-12-26 DIAGNOSIS — R42 Dizziness and giddiness: Secondary | ICD-10-CM

## 2021-12-26 DIAGNOSIS — I10 Essential (primary) hypertension: Secondary | ICD-10-CM

## 2021-12-26 DIAGNOSIS — G4733 Obstructive sleep apnea (adult) (pediatric): Secondary | ICD-10-CM

## 2021-12-26 DIAGNOSIS — H3530 Unspecified macular degeneration: Secondary | ICD-10-CM

## 2021-12-26 DIAGNOSIS — N1831 Chronic kidney disease, stage 3a: Secondary | ICD-10-CM

## 2021-12-26 DIAGNOSIS — M5412 Radiculopathy, cervical region: Secondary | ICD-10-CM

## 2021-12-26 MED ORDER — OLMESARTAN MEDOXOMIL 40 MG PO TABS: 40.0000 mg | ORAL_TABLET | Freq: Every day | ORAL | 0 refills | Status: DC

## 2021-12-26 NOTE — Assessment & Plan Note (Signed)
Continue meclizine as needed 

## 2021-12-26 NOTE — Patient Instructions (Signed)

## 2021-12-26 NOTE — Assessment & Plan Note (Signed)
Continue duloxetine and Tylenol Avoid anti-inflammatories due to CKD 

## 2021-12-26 NOTE — Assessment & Plan Note (Signed)
Continue duloxetine

## 2021-12-26 NOTE — Assessment & Plan Note (Signed)
Encourage diet weight loss

## 2021-12-26 NOTE — Assessment & Plan Note (Signed)
Continue eyedrops per ophthalmology

## 2021-12-26 NOTE — Progress Notes (Signed)
Subjective:    Patient ID: Lindsey Jacobs, female    DOB: 10-Apr-1935, 86 y.o.   MRN: [id number]  HPI  Patient presents to clinic today for follow-up of chronic conditions.  HTN: Her BP today is 140/79.  She is taking Olmesartan and Furosemide as prescribed but reports she has been out of Olmesartan for 1 weeks and has been taking Triamterene HCT instead.  There is no ECG on file.  HLD: Her last LDL was 97, triglycerides 92, 06/2021.  She is not taking any cholesterol-lowering medication at this time.  She tries to consume a low-fat diet.  CKD 3: Her last creatinine was 1.17, GFR 54, 11/2021.  She is on Olmesartan for renal protection.  She does not follow with nephrology.  Prediabetes: Her last A1c was 5.6%, 06/2021.  She is not taking any oral diabetic medication at this time.  She does not check her sugars.  GERD: She is not sure what triggers this.  She denies breakthrough on Pantoprazole.  Upper GI from 05/2019 reviewed.  OSA: She averages 10 hours of sleep per night with the use of her CPAP.  There is no sleep study on file.  Depression: Chronic, managed on Duloxetine.  She is not currently seeing a therapist.  She denies anxiety, SI/HI.  Macular Degeneration: She is using eyedrops prescribed by ophthalmology.  OA: Mainly in her back, shoulders and ankles.  She is taking Duloxetine and Tylenol as prescribed good relief of symptoms.  She does not follow with orthopedics.  Vertigo: Intermittent.  She takes Meclizine as needed with good relief of symptoms.  She does not follow with neurology.  Review of Systems     Past Medical History:  Diagnosis Date   Allergy    Colon polyps    Depression    GERD (gastroesophageal reflux disease)    Glaucoma    Hyperlipidemia    Hypertension    Sleep apnea    Urinary incontinence     Current Outpatient Medications  Medication Sig Dispense Refill   Carboxymeth-Glyc-Polysorb PF (REFRESH DIGITAL PF) 0.5-1-0.5 % SOLN Apply to eye.      Cholecalciferol (VITAMIN D3 MAXIMUM STRENGTH PO) Take by mouth.     cycloSPORINE (RESTASIS) 0.05 % ophthalmic emulsion SMARTSIG:In Eye(s)     dorzolamide-timolol (COSOPT) 22.3-6.8 MG/ML ophthalmic solution      DULoxetine (CYMBALTA) 60 MG capsule TAKE 1 CAPSULE DAILY (NEED APPOINTMENT IN OCTOBER) 90 capsule 0   furosemide (LASIX) 20 MG tablet TAKE 1 TABLET(20 MG) BY MOUTH DAILY 90 tablet 1   latanoprost (XALATAN) 0.005 % ophthalmic solution 1 drop at bedtime.     Magnesium Oxide 500 MG TABS Take by mouth. Patient is taking 2 tablet once daily.     meclizine (ANTIVERT) 12.5 MG tablet Take 1 tablet (12.5 mg total) by mouth 3 (three) times daily as needed for dizziness. 90 tablet 1   Multiple Vitamins-Minerals (ADVANCED EYE HEALTH PO) Take by mouth.     naproxen (NAPROSYN) 500 MG tablet Take 1 tablet (500 mg total) by mouth daily as needed for moderate pain. Take with meal 90 tablet 1   olmesartan (BENICAR) 20 MG tablet Take 2 tablets (40 mg total) by mouth daily. 60 tablet 0   pantoprazole (PROTONIX) 40 MG tablet Take 1 tablet (40 mg total) by mouth daily. 90 tablet 1   No current facility-administered medications for this visit.    Allergies  Allergen Reactions   Amoxapine And Related     Hives  Diamox [Acetazolamide]    Keflex [Cephalexin] Rash   Tape Rash    Family History  Problem Relation Age of Onset   Heart disease Father    Colon polyps Father    Alcohol abuse Father    Breast cancer Neg Hx     Social History   Socioeconomic History   Marital status: Widowed    Spouse name: Not on file   Number of children: Not on file   Years of education: Not on file   Highest education level: Not on file  Occupational History   Occupation: retired  Tobacco Use   Smoking status: Never   Smokeless tobacco: Never  Vaping Use   Vaping Use: Never used  Substance and Sexual Activity   Alcohol use: Never   Drug use: Never   Sexual activity: Not Currently  Other Topics Concern    Not on file  Social History Narrative   Not on file   Social Determinants of Health   Financial Resource Strain: Low Risk  (05/13/2021)   Overall Financial Resource Strain (CARDIA)    Difficulty of Paying Living Expenses: Not hard at all  Food Insecurity: No Food Insecurity (05/13/2021)   Hunger Vital Sign    Worried About Running Out of Food in the Last Year: Never true    Ran Out of Food in the Last Year: Never true  Transportation Needs: No Transportation Needs (05/13/2021)   PRAPARE - Administrator, Civil Service (Medical): No    Lack of Transportation (Non-Medical): No  Physical Activity: Inactive (05/13/2021)   Exercise Vital Sign    Days of Exercise per Week: 0 days    Minutes of Exercise per Session: 0 min  Stress: No Stress Concern Present (05/13/2021)   [employer] of Occupational Health - Occupational Stress Questionnaire    Feeling of Stress : Not at all  Social Connections: Socially Isolated (05/13/2021)   Social Connection and Isolation Panel [NHANES]    Frequency of Communication with Friends and Family: More than three times a week    Frequency of Social Gatherings with Friends and Family: Once a week    Attends Religious Services: Never    Database administrator or Organizations: No    Attends Banker Meetings: Never    Marital Status: Widowed  Intimate Partner Violence: Not At Risk (05/13/2021)   Humiliation, Afraid, Rape, and Kick questionnaire    Fear of Current or Ex-Partner: No    Emotionally Abused: No    Physically Abused: No    Sexually Abused: No     Constitutional: Denies fever, malaise, fatigue, headache or abrupt weight changes.  HEENT: Denies eye pain, eye redness, ear pain, ringing in the ears, wax buildup, runny nose, nasal congestion, bloody nose, or sore throat. Respiratory: Denies difficulty breathing, shortness of breath, cough or sputum production.   Cardiovascular: Patient reports palpitations, swelling in legs.   Denies chest pain, chest tightness,  or swelling in the hands.  Gastrointestinal: Denies abdominal pain, bloating, constipation, diarrhea or blood in the stool.  GU: Denies urgency, frequency, pain with urination, burning sensation, blood in urine, odor or discharge. Musculoskeletal: Patient reports joint pain, difficulty with gait.  Denies decrease in range of motion, muscle pain or joint swelling.  Skin: Denies redness, rashes, lesions or ulcercations.  Neurological: Patient reports intermittent dizziness.  Denies difficulty with memory, difficulty with speech or problems with balance and coordination.  Psych: Patient has a history of depression.  Denies  anxiety, SI/HI.  No other specific complaints in a complete review of systems (except as listed in HPI above).  Objective:   Physical Exam  BP 138/77   Pulse 75   Temp (!) 96.8 F (36 C) (Temporal)   Wt (!) 302 lb (137 kg)   LMP  (LMP Unknown)   SpO2 98%   BMI 50.26 kg/m   Wt Readings from Last 3 Encounters:  11/22/21 (!) 305 lb (138.3 kg)  06/20/21 (!) 313 lb (142 kg)  05/13/21 (!) 316 lb (143.3 kg)    General: Appears her stated age, obese, in NAD. Skin: Warm, dry and intact.  HEENT: Head: normal shape and size; Eyes: sclera white, no icterus, conjunctiva pink, PERRLA and EOMs intact;  Cardiovascular: Normal rate and rhythm. S1,S2 noted.  No murmur, rubs or gallops noted.  1+ nonpitting BLE edema. No carotid bruits noted. Pulmonary/Chest: Normal effort and positive vesicular breath sounds. No respiratory distress. No wheezes, rales or ronchi noted.  Abdomen: Soft and nontender. Normal bowel sounds.  Musculoskeletal: In wheelchair. Neurological: Alert and oriented.. Coordination normal.  Psychiatric: Mood and affect normal. Behavior is normal. Judgment and thought content normal.     BMET    Component Value Date/Time   NA 139 11/22/2021 1147   K 4.2 11/22/2021 1147   CL 102 11/22/2021 1147   CO2 28 11/22/2021 1147    GLUCOSE 91 11/22/2021 1147   BUN 22 11/22/2021 1147   CREATININE 1.17 (H) 11/22/2021 1147   CALCIUM 9.8 11/22/2021 1147   GFRNONAA 54 (L) 06/25/2019 1139   GFRAA 63 06/25/2019 1139    Lipid Panel     Component Value Date/Time   CHOL 171 06/20/2021 0937   TRIG 92 06/20/2021 0937   HDL 55 06/20/2021 0937   CHOLHDL 3.1 06/20/2021 0937   LDLCALC 97 06/20/2021 0937    CBC    Component Value Date/Time   WBC 6.5 06/20/2021 0937   RBC 4.86 06/20/2021 0937   HGB 13.4 06/20/2021 0937   HCT 41.7 06/20/2021 0937   PLT 238 06/20/2021 0937   MCV 85.8 06/20/2021 0937   MCH 27.6 06/20/2021 0937   MCHC 32.1 06/20/2021 0937   RDW 14.6 06/20/2021 0937   LYMPHSABS 1,592 06/25/2019 1139   EOSABS 520 (H) 06/25/2019 1139   BASOSABS 48 06/25/2019 1139    Hgb A1C Lab Results  Component Value Date   HGBA1C 5.6 06/20/2021           Assessment & Plan:      RTC in 6 months for follow-up of chronic conditions Nicki Reaper, NP

## 2021-12-26 NOTE — Assessment & Plan Note (Signed)
Will check A1c in 1 month, lab only Encourage low-carb diet

## 2021-12-26 NOTE — Assessment & Plan Note (Signed)
Try to identify and avoid foods that trigger reflux Encourage weight loss as this can help reduce reflux symptoms Continue pantoprazole 

## 2021-12-26 NOTE — Assessment & Plan Note (Signed)
Will check c-Met and lipid profile in 1 month, lab only Encouraged her to consume low-fat diet

## 2021-12-26 NOTE — Assessment & Plan Note (Signed)
Will check c-Met in 1 month, lab only Avoid anti-inflammatories OTC

## 2021-12-26 NOTE — Assessment & Plan Note (Signed)
Slightly elevated but she has not been taking her medication correctly Refilled olmesartan for 40 mg daily Continue furosemide Reinforced DASH diet We will check c-Met in 1 month, lab only

## 2021-12-26 NOTE — Assessment & Plan Note (Signed)
Encourage weight loss as this can help reduce sleep apnea symptoms Continue CPAP 

## 2022-01-17 ENCOUNTER — Encounter: Payer: Self-pay | Admitting: Internal Medicine

## 2022-01-17 MED ORDER — OLMESARTAN MEDOXOMIL 40 MG PO TABS: 40.0000 mg | ORAL_TABLET | Freq: Every day | ORAL | 1 refills | Status: DC

## 2022-01-23 ENCOUNTER — Other Ambulatory Visit: Payer: Self-pay | Admitting: Internal Medicine

## 2022-01-23 DIAGNOSIS — I10 Essential (primary) hypertension: Secondary | ICD-10-CM

## 2022-01-24 NOTE — Telephone Encounter (Signed)
Medication was discontinued 11/22/2021 - not on med list. Requested Prescriptions  Pending Prescriptions Disp Refills   triamterene-hydrochlorothiazide (MAXZIDE-25) 37.5-25 MG tablet [Pharmacy Med Name: TRIAMTERENE/HYDROCHLOROTHIAZIDE TAB 37.5/25MG ] 90 tablet 3    Sig: TAKE 1 TABLET DAILY     Cardiovascular: Diuretic Combos Failed - 01/23/2022  2:48 AM      Failed - Cr in normal range and within 180 days    Creat  Date Value Ref Range Status  11/22/2021 1.17 (H) 0.60 - 0.95 mg/dL Final         Passed - K in normal range and within 180 days    Potassium  Date Value Ref Range Status  11/22/2021 4.2 3.5 - 5.3 mmol/L Final         Passed - Na in normal range and within 180 days    Sodium  Date Value Ref Range Status  11/22/2021 139 135 - 146 mmol/L Final         Passed - Last BP in normal range    BP Readings from Last 1 Encounters:  12/26/21 138/77         Passed - Valid encounter within last 6 months    Recent Outpatient Visits           4 weeks ago Pure hypercholesterolemia   [place], Coralie Keens, NP   2 months ago Primary hypertension   [place], Coralie Keens, NP   7 months ago Prediabetes   [facility] [place], [place], NP   11 months ago Right upper quadrant abdominal mass   [facility] [place], Coralie Keens, NP   1 year ago Prediabetes   [facility] [place], Coralie Keens, NP

## 2022-01-27 ENCOUNTER — Other Ambulatory Visit: Payer: Self-pay

## 2022-01-27 DIAGNOSIS — R7303 Prediabetes: Secondary | ICD-10-CM

## 2022-01-27 DIAGNOSIS — E7800 Pure hypercholesterolemia, unspecified: Secondary | ICD-10-CM

## 2022-01-30 ENCOUNTER — Other Ambulatory Visit: Payer: Medicare Other

## 2022-01-31 LAB — COMPLETE METABOLIC PANEL WITH GFR
AG Ratio: 1.3 (calc) (ref 1.0–2.5)
ALT: 6 U/L (ref 6–29)
AST: 11 U/L (ref 10–35)
Albumin: 3.8 g/dL (ref 3.6–5.1)
Alkaline phosphatase (APISO): 86 U/L (ref 37–153)
BUN/Creatinine Ratio: 13 (calc) (ref 6–22)
BUN: 15 mg/dL (ref 7–25)
CO2: 30 mmol/L (ref 20–32)
Calcium: 10.2 mg/dL (ref 8.6–10.4)
Chloride: 105 mmol/L (ref 98–110)
Creat: 1.13 mg/dL — ABNORMAL HIGH (ref 0.60–0.95)
Globulin: 3 g/dL (calc) (ref 1.9–3.7)
Glucose, Bld: 91 mg/dL (ref 65–99)
Potassium: 5 mmol/L (ref 3.5–5.3)
Sodium: 143 mmol/L (ref 135–146)
Total Bilirubin: 0.6 mg/dL (ref 0.2–1.2)
Total Protein: 6.8 g/dL (ref 6.1–8.1)
eGFR: 48 mL/min/{1.73_m2} — ABNORMAL LOW (ref >=60)

## 2022-01-31 LAB — LIPID PANEL
Cholesterol: 179 mg/dL (ref <200)
HDL: 66 mg/dL (ref >=50)
LDL Cholesterol (Calc): 97 mg/dL (calc)
Non-HDL Cholesterol (Calc): 113 mg/dL (calc) (ref <130)
Total CHOL/HDL Ratio: 2.7 (calc) (ref <5.0)
Triglycerides: 70 mg/dL (ref <150)

## 2022-01-31 LAB — HEMOGLOBIN A1C
Hgb A1c MFr Bld: 5.6 % of total Hgb (ref <5.7)
Mean Plasma Glucose: 114 mg/dL
eAG (mmol/L): 6.3 mmol/L

## 2022-02-20 ENCOUNTER — Encounter: Payer: Self-pay | Admitting: Internal Medicine

## 2022-03-14 ENCOUNTER — Encounter: Payer: Self-pay | Admitting: Internal Medicine

## 2022-03-15 ENCOUNTER — Ambulatory Visit (INDEPENDENT_AMBULATORY_CARE_PROVIDER_SITE_OTHER): Payer: Medicare Other | Admitting: Internal Medicine

## 2022-03-15 ENCOUNTER — Encounter: Payer: Self-pay | Admitting: Internal Medicine

## 2022-03-15 VITALS — BP 155/86 | HR 68 | Temp 96.8°F | Wt 306.0 lb

## 2022-03-15 DIAGNOSIS — R002 Palpitations: Secondary | ICD-10-CM | POA: Diagnosis not present

## 2022-03-15 DIAGNOSIS — I10 Essential (primary) hypertension: Secondary | ICD-10-CM

## 2022-03-15 DIAGNOSIS — G4733 Obstructive sleep apnea (adult) (pediatric): Secondary | ICD-10-CM

## 2022-03-15 MED ORDER — HYDRALAZINE HCL 10 MG PO TABS: 10.0000 mg | ORAL_TABLET | Freq: Two times a day (BID) | ORAL | 0 refills | Status: DC

## 2022-03-15 NOTE — Patient Instructions (Signed)
Palpitations Palpitations are feelings that your heartbeat is not normal. Your heartbeat may feel like it is: Uneven (irregular). Faster than normal. Fluttering. Skipping a beat. This is usually not a serious problem. However, a doctor will do tests and check your medical history to make sure that you do not have a serious heart problem. Follow these instructions at home: Watch for any changes in your condition. Tell your doctor about any changes. Take these actions to help manage your symptoms: Eating and drinking Follow instructions from your doctor about things to eat and drink. You may be told to avoid these things: Drinks that have caffeine in them, such as coffee, tea, soft drinks, and energy drinks. Chocolate. Alcohol. Diet pills. Lifestyle     Try to lower your stress. These things can help you relax: Yoga. Deep breathing and meditation. Guided imagery. This is using words and images to create positive thoughts. Exercise, including swimming, jogging, and walking. Tell your doctor if you have more abnormal heartbeats when you are active. If you have chest pain or feel short of breath with exercise, do not keep doing the exercise until you are seen by your doctor. Biofeedback. This is using your mind to control things in your body, such as your heartbeat. Get plenty of rest and sleep. Keep a regular bed time. Do not use drugs, such as cocaine or ecstasy. Do not use marijuana. Do not smoke or use any products that contain nicotine or tobacco. If you need help quitting, ask your doctor. General instructions Take over-the-counter and prescription medicines only as told by your doctor. Keep all follow-up visits. You may need more tests if palpitations do not go away or get worse. Contact a doctor if: You keep having fast or uneven heartbeats for a long time. Your symptoms happen more often. Get help right away if: You have chest pain. You feel short of breath. You have a very  bad headache. You feel dizzy. You faint. These symptoms may be an emergency. Get help right away. Call your local emergency services (911 in the [place] Do not wait to see if the symptoms will go away. Do not drive yourself to the hospital. Summary Palpitations are feelings that your heartbeat is uneven or faster than normal. It may feel like your heart is fluttering or skipping a beat. Avoid food and drinks that may cause this condition. These include caffeine, chocolate, and alcohol. Try to lower your stress. Do not smoke or use drugs. Get help right away if you faint, feel dizzy, feel short of breath, have chest pain, or have a very bad headache. This information is not intended to replace advice given to you by your health care provider. Make sure you discuss any questions you have with your health care provider. Document Revised: 05/26/2020 Document Reviewed: 05/26/2020 Elsevier Patient Education  [place].

## 2022-03-15 NOTE — Progress Notes (Signed)
Subjective:    Patient ID: Lindsey Jacobs, female    DOB: 12-26-1935, 87 y.o.   MRN: [id number]  HPI  Patient presents to clinic today with complaint of elevated blood pressures.  She noticed this in the last few weeks.  Her BP at home ranges 139/77- 190/112.  She is taking Olmesartan and Furosemide as prescribed but admits that she did not take her blood pressure medication this morning.  She reports her blood pressure tends to trend upward in the evenings.  Her BP today is 140/90.  There is no ECG on file.  She has a history of OSA and has been compliant with her CPAP.  Review of Systems     Past Medical History:  Diagnosis Date   Allergy    Colon polyps    Depression    GERD (gastroesophageal reflux disease)    Glaucoma    Hyperlipidemia    Hypertension    Sleep apnea    Urinary incontinence     Current Outpatient Medications  Medication Sig Dispense Refill   Carboxymeth-Glyc-Polysorb PF (REFRESH DIGITAL PF) 0.5-1-0.5 % SOLN Apply to eye.     Cholecalciferol (VITAMIN D3 MAXIMUM STRENGTH PO) Take by mouth.     cycloSPORINE (RESTASIS) 0.05 % ophthalmic emulsion SMARTSIG:In Eye(s)     dorzolamide-timolol (COSOPT) 22.3-6.8 MG/ML ophthalmic solution      DULoxetine (CYMBALTA) 60 MG capsule TAKE 1 CAPSULE DAILY (NEED APPOINTMENT IN OCTOBER) 90 capsule 0   furosemide (LASIX) 20 MG tablet TAKE 1 TABLET(20 MG) BY MOUTH DAILY 90 tablet 1   latanoprost (XALATAN) 0.005 % ophthalmic solution 1 drop at bedtime.     Magnesium Oxide 500 MG TABS Take by mouth. Patient is taking 2 tablet once daily.     meclizine (ANTIVERT) 12.5 MG tablet Take 1 tablet (12.5 mg total) by mouth 3 (three) times daily as needed for dizziness. 90 tablet 1   Multiple Vitamins-Minerals (ADVANCED EYE HEALTH PO) Take by mouth.     naproxen (NAPROSYN) 500 MG tablet Take 1 tablet (500 mg total) by mouth daily as needed for moderate pain. Take with meal 90 tablet 1   olmesartan (BENICAR) 40 MG tablet Take 1 tablet (40 mg  total) by mouth daily. 90 tablet 1   pantoprazole (PROTONIX) 40 MG tablet Take 1 tablet (40 mg total) by mouth daily. 90 tablet 1   No current facility-administered medications for this visit.    Allergies  Allergen Reactions   Amoxapine And Related     Hives   Diamox [Acetazolamide]    Keflex [Cephalexin] Rash   Tape Rash    Family History  Problem Relation Age of Onset   Heart disease Father    Colon polyps Father    Alcohol abuse Father    Breast cancer Neg Hx     Social History   Socioeconomic History   Marital status: Widowed    Spouse name: Not on file   Number of children: Not on file   Years of education: Not on file   Highest education level: Not on file  Occupational History   Occupation: retired  Tobacco Use   Smoking status: Never   Smokeless tobacco: Never  Vaping Use   Vaping Use: Never used  Substance and Sexual Activity   Alcohol use: Never   Drug use: Never   Sexual activity: Not Currently  Other Topics Concern   Not on file  Social History Narrative   Not on file   Social Determinants of  Health   Financial Resource Strain: Low Risk  (05/13/2021)   Overall Financial Resource Strain (CARDIA)    Difficulty of Paying Living Expenses: Not hard at all  Food Insecurity: No Food Insecurity (05/13/2021)   Hunger Vital Sign    Worried About Running Out of Food in the Last Year: Never true    Ran Out of Food in the Last Year: Never true  Transportation Needs: No Transportation Needs (05/13/2021)   PRAPARE - Hydrologist (Medical): No    Lack of Transportation (Non-Medical): No  Physical Activity: Inactive (05/13/2021)   Exercise Vital Sign    Days of Exercise per Week: 0 days    Minutes of Exercise per Session: 0 min  Stress: No Stress Concern Present (05/13/2021)   [place]    Feeling of Stress : Not at all  Social Connections: Socially Isolated  (05/13/2021)   Social Connection and Isolation Panel [NHANES]    Frequency of Communication with Friends and Family: More than three times a week    Frequency of Social Gatherings with Friends and Family: Once a week    Attends Religious Services: Never    Marine scientist or Organizations: No    Attends Archivist Meetings: Never    Marital Status: Widowed  Intimate Partner Violence: Not At Risk (05/13/2021)   Humiliation, Afraid, Rape, and Kick questionnaire    Fear of Current or Ex-Partner: No    Emotionally Abused: No    Physically Abused: No    Sexually Abused: No     Constitutional: Denies fever, malaise, fatigue, headache or abrupt weight changes.  Respiratory: Denies difficulty breathing, shortness of breath, cough or sputum production.   Cardiovascular: Pt reports intermittent palpitations, swelling in legs. Denies chest pain, chest tightness, or swelling in the hands.  Musculoskeletal: Patient reports difficulty with gait.  Denies decrease in range of motion, muscle pain or joint pain and swelling.  Skin: Denies redness, rashes, lesions or ulcercations.  Neurological: Denies dizziness, difficulty with memory, difficulty with speech or problems with balance and coordination.    No other specific complaints in a complete review of systems (except as listed in HPI above).  Objective:   Physical Exam   BP (!) 155/86 (BP Location: Left Wrist, Patient Position: Sitting, Cuff Size: Normal)   Pulse 68   Temp (!) 96.8 F (36 C) (Temporal)   Wt (!) 306 lb (138.8 kg)   LMP  (LMP Unknown)   SpO2 100%   BMI 50.92 kg/m   Wt Readings from Last 3 Encounters:  12/26/21 (!) 302 lb (137 kg)  11/22/21 (!) 305 lb (138.3 kg)  06/20/21 (!) 313 lb (142 kg)    General: Appears her stated age, obese, in NAD. HEENT: Head: normal shape and size; Eyes: EOMs intact;  Cardiovascular: Normal rate and rhythm. S1,S2 noted.  No murmur, rubs or gallops noted. No JVD.  1+ BLE  edema.  Pulmonary/Chest: Normal effort and positive vesicular breath sounds. No respiratory distress. No wheezes, rales or ronchi noted.  Musculoskeletal: In wheelchair. Neurological: Alert and oriented. Coordination normal.     BMET    Component Value Date/Time   NA 143 01/30/2022 0917   K 5.0 01/30/2022 0917   CL 105 01/30/2022 0917   CO2 30 01/30/2022 0917   GLUCOSE 91 01/30/2022 0917   BUN 15 01/30/2022 0917   CREATININE 1.13 (H) 01/30/2022 0917   CALCIUM 10.2 01/30/2022  [id number]   GFRNONAA 54 (L) 06/25/2019 1139   GFRAA 63 06/25/2019 1139    Lipid Panel     Component Value Date/Time   CHOL 179 01/30/2022 0917   TRIG 70 01/30/2022 0917   HDL 66 01/30/2022 0917   CHOLHDL 2.7 01/30/2022 0917   LDLCALC 97 01/30/2022 0917    CBC    Component Value Date/Time   WBC 6.5 06/20/2021 0937   RBC 4.86 06/20/2021 0937   HGB 13.4 06/20/2021 0937   HCT 41.7 06/20/2021 0937   PLT 238 06/20/2021 0937   MCV 85.8 06/20/2021 0937   MCH 27.6 06/20/2021 0937   MCHC 32.1 06/20/2021 0937   RDW 14.6 06/20/2021 0937   LYMPHSABS 1,592 06/25/2019 1139   EOSABS 520 (H) 06/25/2019 1139   BASOSABS 48 06/25/2019 1139    Hgb A1C Lab Results  Component Value Date   HGBA1C 5.6 01/30/2022           Assessment & Plan:   Palpitations:  Indication for ECG: Palpitations Interpretation of ECG: normal rate and rhythm, no acute findings Comparison of ECG: None Advised if she is concerned about palpitations, we can always order Holter monitor at home for 2 to 3 days.  RTC in 4 months for follow-up of chronic conditions Webb Silversmith, NP

## 2022-03-15 NOTE — Assessment & Plan Note (Signed)
Continue CPAP use as noncompliance can increase blood pressure

## 2022-03-15 NOTE — Assessment & Plan Note (Signed)
Elevated today, encourage medication compliance prior to coming to appointments Continue olmesartan and furosemide at current dose Rx for hydralazine 10 mg twice daily Reinforced DASH diet and weight loss

## 2022-03-15 NOTE — Assessment & Plan Note (Signed)
Encourage weight loss as this can help improve blood pressure as well

## 2022-03-17 ENCOUNTER — Encounter: Payer: Self-pay | Admitting: Internal Medicine

## 2022-03-17 ENCOUNTER — Encounter (INDEPENDENT_AMBULATORY_CARE_PROVIDER_SITE_OTHER): Payer: Medicare Other | Admitting: Internal Medicine

## 2022-03-17 DIAGNOSIS — R002 Palpitations: Secondary | ICD-10-CM

## 2022-03-17 DIAGNOSIS — N1831 Chronic kidney disease, stage 3a: Secondary | ICD-10-CM

## 2022-03-17 DIAGNOSIS — R519 Headache, unspecified: Secondary | ICD-10-CM

## 2022-03-17 DIAGNOSIS — I10 Essential (primary) hypertension: Secondary | ICD-10-CM

## 2022-03-17 MED ORDER — FUROSEMIDE 20 MG PO TABS: ORAL_TABLET | ORAL | 1 refills | Status: DC

## 2022-03-17 NOTE — Telephone Encounter (Signed)
Please see the MyChart message reply(ies) for my assessment and plan.    This patient gave consent for this Medical Advice Message and is aware that it may result in a bill to [employer], as well as the possibility of receiving a bill for a co-payment or deductible. They are an established patient, but are not seeking medical advice exclusively about a problem treated during an in person or video visit in the last seven days. I did not recommend an in person or video visit within seven days of my reply.    I spent a total of 12 minutes cumulative time within 7 days through [employer].  Webb Silversmith, NP

## 2022-03-20 ENCOUNTER — Encounter: Payer: Self-pay | Admitting: Internal Medicine

## 2022-03-20 ENCOUNTER — Other Ambulatory Visit: Payer: Self-pay | Admitting: Internal Medicine

## 2022-03-20 DIAGNOSIS — F324 Major depressive disorder, single episode, in partial remission: Secondary | ICD-10-CM

## 2022-03-20 MED ORDER — FUROSEMIDE 20 MG PO TABS: ORAL_TABLET | ORAL | 0 refills | Status: DC

## 2022-03-21 NOTE — Telephone Encounter (Signed)
Requested Prescriptions  Pending Prescriptions Disp Refills   DULoxetine (CYMBALTA) 60 MG capsule [Pharmacy Med Name: DULOXETINE HCL DR CAPS '60MG'$ ] 90 capsule 1    Sig: TAKE 1 CAPSULE DAILY (NEED APPOINTMENT IN OCTOBER)     Psychiatry: Antidepressants - SNRI - duloxetine Failed - 03/20/2022  2:35 AM      Failed - Cr in normal range and within 360 days    Creat  Date Value Ref Range Status  01/30/2022 1.13 (H) 0.60 - 0.95 mg/dL Final         Failed - Last BP in normal range    BP Readings from Last 1 Encounters:  03/15/22 (!) 155/86         Passed - eGFR is 30 or above and within 360 days    GFR, Est African American  Date Value Ref Range Status  06/25/2019 63 > OR = 60 mL/min/1.53m Final   GFR, Est Non African American  Date Value Ref Range Status  06/25/2019 54 (L) > OR = 60 mL/min/1.7102mFinal   eGFR  Date Value Ref Range Status  01/30/2022 48 (L) > OR = 60 mL/min/1.736minal         Passed - Completed PHQ-2 or PHQ-9 in the last 360 days      Passed - Valid encounter within last 6 months    Recent Outpatient Visits           6 days ago Essential hypertension   [facility]   2 months ago Pure hypercholesterolemia   [facility]   3 months ago Primary hypertension   [facility]   9 months [facility]   1 year ago Right upper quadrant abdominal mass   [facility]       Future Appointments             In 3 months Baity, [facility]

## 2022-03-30 ENCOUNTER — Ambulatory Visit: Payer: Medicare Other | Attending: Internal Medicine

## 2022-03-30 ENCOUNTER — Encounter: Payer: Self-pay | Admitting: Internal Medicine

## 2022-03-30 DIAGNOSIS — R002 Palpitations: Secondary | ICD-10-CM

## 2022-04-04 DIAGNOSIS — R002 Palpitations: Secondary | ICD-10-CM

## 2022-04-11 ENCOUNTER — Encounter: Payer: Self-pay | Admitting: Internal Medicine

## 2022-04-11 MED ORDER — HYDRALAZINE HCL 10 MG PO TABS: 10.0000 mg | ORAL_TABLET | Freq: Two times a day (BID) | ORAL | 0 refills | Status: DC

## 2022-04-11 NOTE — Addendum Note (Signed)
Addended by: Kizzie Furnish on: 04/11/2022 02:38 PM   Modules accepted: Orders

## 2022-04-14 ENCOUNTER — Encounter: Payer: Self-pay | Admitting: Internal Medicine

## 2022-04-14 ENCOUNTER — Ambulatory Visit (INDEPENDENT_AMBULATORY_CARE_PROVIDER_SITE_OTHER): Payer: Medicare Other | Admitting: Internal Medicine

## 2022-04-14 VITALS — BP 126/84 | HR 80 | Temp 96.8°F | Wt 302.0 lb

## 2022-04-14 DIAGNOSIS — K6289 Other specified diseases of anus and rectum: Secondary | ICD-10-CM

## 2022-04-14 MED ORDER — HYDROCORTISONE ACETATE 25 MG RE SUPP: 25.0000 mg | Freq: Two times a day (BID) | RECTAL | 0 refills | Status: DC

## 2022-04-14 NOTE — Patient Instructions (Signed)
Hemorrhoids Hemorrhoids are swollen veins that may form: In the butt (rectum). These are called internal hemorrhoids. Around the opening of the butt (anus). These are called external hemorrhoids. Most hemorrhoids do not cause very bad problems. They often get better with changes to your lifestyle and what you eat. What are the causes? Having trouble pooping (constipation) or watery poop (diarrhea). Pushing too hard when you poop. Pregnancy. Being very overweight (obese). Sitting for too long. Riding a bike for a long time. Heavy lifting or other things that take a lot of effort. Anal sex. What are the signs or symptoms? Pain. Itching or soreness in the butt. Bleeding from the butt. Leaking poop. Swelling. One or more lumps around the opening of your butt. How is this treated? In most cases, hemorrhoids can be treated at home. You may be told to: Change what you eat. Make changes to your lifestyle. If these treatments do not help, you may need to have a procedure done. Your doctor may need to: Place rubber bands at the bottom of the hemorrhoids to make them fall off. Put medicine into the hemorrhoids to shrink them. Shine a type of light on the hemorrhoids to cause them to fall off. Do surgery to get rid of the hemorrhoids. Follow these instructions at home: Medicines Take over-the-counter and prescription medicines only as told by your doctor. Use creams with medicine in them or medicines that you put in your butt as told by your doctor. Eating and drinking  Eat foods that have a lot of fiber in them. These include whole grains, beans, nuts, fruits, and vegetables. Ask your doctor about taking products that have fiber added to them (fibersupplements). Take in less fat. You can do this by: Eating low-fat dairy products. Eating less red meat. Staying away from processed foods. Drink enough fluid to keep your pee (urine) pale yellow. Managing pain and swelling  Take a  warm-water bath (sitz bath) for 20 minutes to ease pain. Do this 3-4 times a day. You may do this in a bathtub. You may also use a portable sitz bath that fits over the toilet. If told, put ice on the painful area. It may help to use ice between your warm baths. Put ice in a plastic bag. Place a towel between your skin and the bag. Leave the ice on for 20 minutes, 2-3 times a day. If your skin turns bright red, take off the ice right away to prevent skin damage. The risk of damage is higher if you cannot feel pain, heat, or cold. General instructions Exercise. Ask your doctor how much and what kind of exercise is best for you. Go to the bathroom when you need to poop. Do not wait. Try not to push too hard when you poop. Keep your butt dry and clean. Use wet toilet paper or moist towelettes after you poop. Do not sit on the toilet for a long time. Contact a doctor if: You have pain and swelling that do not get better with treatment. You have trouble pooping. You cannot poop. You have pain or swelling outside the area of the hemorrhoids. Get help right away if: You have bleeding from the butt that will not stop. This information is not intended to replace advice given to you by your health care provider. Make sure you discuss any questions you have with your health care provider. Document Revised: 09/14/2021 Document Reviewed: 09/14/2021 Elsevier Patient Education  2023 Elsevier Inc.  

## 2022-04-14 NOTE — Progress Notes (Signed)
Subjective:    Patient ID: Lindsey Jacobs, female    DOB: 1935/05/31, 87 y.o.   MRN: [id number]  HPI  Patient presents to clinic today with complaint of a lump of her rectum.  She noticed this about 2 weeks ago ago.  The lump is hard and on-call triple.  She she reports it has gotten smaller in size she feels like this lump is blocking part of her rectum.  She reports her stools are small and ribbonlike but this is chronic and not new.  She denies rectal itching or bleeding.  She does not feel overly constipated but reports that she does have some lower abdominal pain from time to time that comes and goes.  She has not tried anything OTC for this.  Review of Systems  Past Medical History:  Diagnosis Date   Allergy    Colon polyps    Depression    GERD (gastroesophageal reflux disease)    Glaucoma    Hyperlipidemia    Hypertension    Sleep apnea    Urinary incontinence     Current Outpatient Medications  Medication Sig Dispense Refill   Carboxymeth-Glyc-Polysorb PF (REFRESH DIGITAL PF) 0.5-1-0.5 % SOLN Apply to eye.     Cholecalciferol (VITAMIN D3 MAXIMUM STRENGTH PO) Take by mouth.     cycloSPORINE (RESTASIS) 0.05 % ophthalmic emulsion SMARTSIG:In Eye(s)     dorzolamide-timolol (COSOPT) 22.3-6.8 MG/ML ophthalmic solution      DULoxetine (CYMBALTA) 60 MG capsule TAKE 1 CAPSULE DAILY (NEED APPOINTMENT IN OCTOBER) 90 capsule 1   furosemide (LASIX) 20 MG tablet TAKE 1 TABLET(20 MG) BY MOUTH DAILY 10 tablet 0   hydrALAZINE (APRESOLINE) 10 MG tablet Take 1 tablet (10 mg total) by mouth 2 (two) times daily. 180 tablet 0   latanoprost (XALATAN) 0.005 % ophthalmic solution 1 drop at bedtime.     Magnesium Oxide 500 MG TABS Take by mouth. Patient is taking 2 tablet once daily.     meclizine (ANTIVERT) 12.5 MG tablet Take 1 tablet (12.5 mg total) by mouth 3 (three) times daily as needed for dizziness. 90 tablet 1   Multiple Vitamins-Minerals (ADVANCED EYE HEALTH PO) Take by mouth.      naproxen (NAPROSYN) 500 MG tablet Take 1 tablet (500 mg total) by mouth daily as needed for moderate pain. Take with meal 90 tablet 1   olmesartan (BENICAR) 40 MG tablet Take 1 tablet (40 mg total) by mouth daily. 90 tablet 1   pantoprazole (PROTONIX) 40 MG tablet Take 1 tablet (40 mg total) by mouth daily. 90 tablet 1   No current facility-administered medications for this visit.    Allergies  Allergen Reactions   Amoxapine And Related     Hives   Diamox [Acetazolamide]    Keflex [Cephalexin] Rash   Tape Rash    Family History  Problem Relation Age of Onset   Heart disease Father    Colon polyps Father    Alcohol abuse Father    Breast cancer Neg Hx     Social History   Socioeconomic History   Marital status: Widowed    Spouse name: Not on file   Number of children: Not on file   Years of education: Not on file   Highest education level: Not on file  Occupational History   Occupation: retired  Tobacco Use   Smoking status: Never   Smokeless tobacco: Never  Vaping Use   Vaping Use: Never used  Substance and Sexual Activity  Alcohol use: Never   Drug use: Never   Sexual activity: Not Currently  Other Topics Concern   Not on file  Social History Narrative   Not on file   Social Determinants of Health   Financial Resource Strain: Low Risk  (05/13/2021)   Overall Financial Resource Strain (CARDIA)    Difficulty of Paying Living Expenses: Not hard at all  Food Insecurity: No Food Insecurity (05/13/2021)   Hunger Vital Sign    Worried About Running Out of Food in the Last Year: Never true    Ran Out of Food in the Last Year: Never true  Transportation Needs: No Transportation Needs (05/13/2021)   PRAPARE - Hydrologist (Medical): No    Lack of Transportation (Non-Medical): No  Physical Activity: Inactive (05/13/2021)   Exercise Vital Sign    Days of Exercise per Week: 0 days    Minutes of Exercise per Session: 0 min  Stress: No  Stress Concern Present (05/13/2021)   [place]    Feeling of Stress : Not at all  Social Connections: Socially Isolated (05/13/2021)   Social Connection and Isolation Panel [NHANES]    Frequency of Communication with Friends and Family: More than three times a week    Frequency of Social Gatherings with Friends and Family: Once a week    Attends Religious Services: Never    Marine scientist or Organizations: No    Attends Archivist Meetings: Never    Marital Status: Widowed  Intimate Partner Violence: Not At Risk (05/13/2021)   Humiliation, Afraid, Rape, and Kick questionnaire    Fear of Current or Ex-Partner: No    Emotionally Abused: No    Physically Abused: No    Sexually Abused: No     Constitutional: Denies fever, malaise, fatigue, headache or abrupt weight changes.  Respiratory: Denies difficulty breathing, shortness of breath, cough or sputum production.   Cardiovascular: Denies chest pain, chest tightness, palpitations or swelling in the hands or feet.  Gastrointestinal: Patient reports intermittent lower abdominal pain, lump of rectum.  Denies bloating, constipation, diarrhea or blood in the stool.   No other specific complaints in a complete review of systems (except as listed in HPI above).     Objective:   Physical Exam  BP 126/84 (BP Location: Right Wrist, Patient Position: Sitting, Cuff Size: Normal)   Pulse 80   Temp (!) 96.8 F (36 C) (Temporal)   Wt (!) 302 lb (137 kg)   LMP  (LMP Unknown)   SpO2 97%   BMI 50.26 kg/m   Wt Readings from Last 3 Encounters:  03/15/22 (!) 306 lb (138.8 kg)  12/26/21 (!) 302 lb (137 kg)  11/22/21 (!) 305 lb (138.3 kg)    General: Appears her stated age, obese, in NAD. Cardiovascular: Normal rate and rhythm.  Pulmonary/Chest: Normal effort and positive vesicular breath sounds. No respiratory distress.  Abdomen: Normal bowel sounds.  Rectal:  She has a 2 cm hard mass at 3:00 at the anal opening that extends 4 cm into the rectum.  This mass is hard but does not appear to be tender.  It does not appear to be fluctuant.  Normal rectal tone. Neurological: Alert and oriented.   BMET    Component Value Date/Time   NA 143 01/30/2022 0917   K 5.0 01/30/2022 0917   CL 105 01/30/2022 0917   CO2 30 01/30/2022 0917   GLUCOSE  91 01/30/2022 0917   BUN 15 01/30/2022 0917   CREATININE 1.13 (H) 01/30/2022 0917   CALCIUM 10.2 01/30/2022 0917   GFRNONAA 54 (L) 06/25/2019 1139   GFRAA 63 06/25/2019 1139    Lipid Panel     Component Value Date/Time   CHOL 179 01/30/2022 0917   TRIG 70 01/30/2022 0917   HDL 66 01/30/2022 0917   CHOLHDL 2.7 01/30/2022 0917   LDLCALC 97 01/30/2022 0917    CBC    Component Value Date/Time   WBC 6.5 06/20/2021 0937   RBC 4.86 06/20/2021 0937   HGB 13.4 06/20/2021 0937   HCT 41.7 06/20/2021 0937   PLT 238 06/20/2021 0937   MCV 85.8 06/20/2021 0937   MCH 27.6 06/20/2021 0937   MCHC 32.1 06/20/2021 0937   RDW 14.6 06/20/2021 0937   LYMPHSABS 1,592 06/25/2019 1139   EOSABS 520 (H) 06/25/2019 1139   BASOSABS 48 06/25/2019 1139    Hgb A1C Lab Results  Component Value Date   HGBA1C 5.6 01/30/2022           Assessment & Plan:   Mass of Rectum:  It is very hard to tell if this is a thrombosed hemorrhoid versus fluid collection versus other mass Will treat with Anusol suppositories 2 times a day for 1 week If no improvement by Monday, would consider antibiotics for possible fluid collection Also consider referral to GI for further evaluation if symptoms persist   RTC in 3 months for follow-up of chronic conditions Webb Silversmith, NP

## 2022-04-15 ENCOUNTER — Other Ambulatory Visit: Payer: Self-pay | Admitting: Internal Medicine

## 2022-04-17 ENCOUNTER — Encounter: Payer: Self-pay | Admitting: Internal Medicine

## 2022-04-17 ENCOUNTER — Other Ambulatory Visit: Payer: Self-pay | Admitting: Internal Medicine

## 2022-04-17 DIAGNOSIS — K219 Gastro-esophageal reflux disease without esophagitis: Secondary | ICD-10-CM

## 2022-04-17 NOTE — Telephone Encounter (Signed)
Requested Prescriptions  Refused Prescriptions Disp Refills   hydrALAZINE (APRESOLINE) 10 MG tablet [Pharmacy Med Name: HYDRALAZINE 10 MG TABLETS (ORANGE)] 60 tablet     Sig: TAKE 1 TABLET(10 MG) BY MOUTH TWICE DAILY     Cardiovascular:  Vasodilators Failed - 04/15/2022 11:29 AM      Failed - ANA Screen, Ifa, Serum in normal range and within 360 days    Anti Nuclear Antibody (ANA)  Date Value Ref Range Status  06/25/2019 NEGATIVE NEGATIVE Final    Comment:    ANA IFA is a first line screen for detecting the presence of up to approximately 150 autoantibodies in various autoimmune diseases. A negative ANA IFA result suggests an ANA-associated autoimmune disease is not present at this time, but is not definitive. If there is high clinical suspicion for Sjogren's syndrome, testing for anti-SS-A/Ro antibody should be considered. Anti-Jo-1 antibody should be considered for clinically suspected inflammatory myopathies. . AC-0: Negative . International Consensus on ANA Patterns (https://www.hernandez-brewer.com/) . For additional information, please refer to http://education.[employer].com/faq/FAQ177 (This link is being provided for informational/ educational purposes only.) .          Passed - HCT in normal range and within 360 days    HCT  Date Value Ref Range Status  06/20/2021 41.7 35.0 - 45.0 % Final         Passed - HGB in normal range and within 360 days    Hemoglobin  Date Value Ref Range Status  06/20/2021 13.4 11.7 - 15.5 g/dL Final         Passed - RBC in normal range and within 360 days    RBC  Date Value Ref Range Status  06/20/2021 4.86 3.80 - 5.10 Million/uL Final         Passed - WBC in normal range and within 360 days    WBC  Date Value Ref Range Status  06/20/2021 6.5 3.8 - 10.8 Thousand/uL Final         Passed - PLT in normal range and within 360 days    Platelets  Date Value Ref Range Status  06/20/2021 238 140 - 400 Thousand/uL  Final         Passed - Last BP in normal range    BP Readings from Last 1 Encounters:  04/14/22 126/84         Passed - Valid encounter within last 12 months    Recent Outpatient Visits           3 days ago Rectal mass   [place], Coralie Keens, NP   1 month ago Essential hypertension   [facility] [place], Coralie Keens, NP   3 months ago Pure hypercholesterolemia   [facility] [place], Coralie Keens, NP   4 months ago Primary hypertension   [facility] [place], Coralie Keens, NP   10 months ago [facility] [place], Coralie Keens, NP       Future Appointments             In 3 weeks Agbor-Etang, Aaron Edelman, MD [place] [facility]   In 1 month Bacigalupo, Dionne Bucy, MD [facility], [place]   In 2 months [place], Coralie Keens, NP [facility], [facility]

## 2022-04-18 NOTE — Telephone Encounter (Signed)
Unable to refill per protocol, Rx expired.request is too soon. Last refill 11/21/21 for 90 days and 1 refill.  Requested Prescriptions  Pending Prescriptions Disp Refills   pantoprazole (PROTONIX) 40 MG tablet [Pharmacy Med Name: PANTOPRAZOLE SODIUM DR TABS 40MG ] 90 tablet 3    Sig: TAKE 1 TABLET DAILY     Gastroenterology: Proton Pump Inhibitors Passed - 04/17/2022  2:43 PM      Passed - Valid encounter within last 12 months    Recent Outpatient Visits           4 days ago Rectal mass   [facility] [place], Coralie Keens, NP   1 month ago Essential hypertension   [facility] [place], Coralie Keens, NP   3 months ago Pure hypercholesterolemia   [facility] [place], Coralie Keens, NP   4 months ago Primary hypertension   [facility] [place], Coralie Keens, NP   10 months ago [facility] [place], Coralie Keens, NP       Future Appointments             In 3 weeks [place], Aaron Edelman, MD [place] [facility] [place]   In 1 month [place], Dionne Bucy, MD [facility], [place]   In 2 months [place], Coralie Keens, NP [facility], [facility]

## 2022-04-21 ENCOUNTER — Encounter: Payer: Self-pay | Admitting: Internal Medicine

## 2022-04-21 DIAGNOSIS — N2889 Other specified disorders of kidney and ureter: Secondary | ICD-10-CM

## 2022-04-21 DIAGNOSIS — K6289 Other specified diseases of anus and rectum: Secondary | ICD-10-CM

## 2022-04-24 MED ORDER — SULFAMETHOXAZOLE-TRIMETHOPRIM 800-160 MG PO TABS: 1.0000 | ORAL_TABLET | Freq: Two times a day (BID) | ORAL | 0 refills | Status: AC

## 2022-04-25 ENCOUNTER — Ambulatory Visit: Payer: Medicare Other

## 2022-05-01 ENCOUNTER — Other Ambulatory Visit: Payer: Medicare Other

## 2022-05-02 ENCOUNTER — Ambulatory Visit
Admission: RE | Admit: 2022-05-02 | Discharge: 2022-05-02 | Disposition: A | Discharge status: Home / Self Care | Payer: Medicare Other | Source: Ambulatory Visit | Attending: Internal Medicine | Admitting: Internal Medicine

## 2022-05-02 ENCOUNTER — Ambulatory Visit: Payer: Medicare Other

## 2022-05-02 ENCOUNTER — Other Ambulatory Visit: Payer: Self-pay

## 2022-05-02 DIAGNOSIS — K6289 Other specified diseases of anus and rectum: Secondary | ICD-10-CM

## 2022-05-02 LAB — POCT I-STAT CREATININE: Creatinine, Ser: 1.5 mg/dL — ABNORMAL HIGH (ref 0.44–1.00)

## 2022-05-02 MED ORDER — SULFAMETHOXAZOLE-TRIMETHOPRIM 800-160 MG PO TABS: 1.0000 | ORAL_TABLET | Freq: Two times a day (BID) | ORAL | 0 refills | Status: AC

## 2022-05-02 MED ORDER — IOHEXOL 9 MG/ML PO SOLN
500.0000 mL | ORAL | Status: AC
  Administered 2022-05-02 (×2): 500 mL via ORAL

## 2022-05-02 MED ORDER — IOHEXOL 350 MG/ML SOLN
75.0000 mL | Freq: Once | INTRAVENOUS | Status: AC | PRN
  Administered 2022-05-02: 75 mL via INTRAVENOUS

## 2022-05-02 MED ORDER — PEG 3350-KCL-NA BICARB-NACL 420 G PO SOLR: 4000.0000 mL | Freq: Once | ORAL | 0 refills | Status: AC

## 2022-05-02 NOTE — Addendum Note (Signed)
Addended by: Lorre Munroe on: 05/02/2022 10:31 AM   Modules accepted: Orders

## 2022-05-02 NOTE — Addendum Note (Signed)
Addended by: Lorre Munroe on: 05/02/2022 02:51 PM   Modules accepted: Orders

## 2022-05-04 ENCOUNTER — Ambulatory Visit: Admission: RE | Admit: 2022-05-04 | Payer: Medicare Other | Source: Ambulatory Visit

## 2022-05-04 ENCOUNTER — Telehealth: Payer: Self-pay

## 2022-05-04 NOTE — Telephone Encounter (Signed)
I have called the pt 5 times, phone is busy  I called the daughter and lmovm

## 2022-05-04 NOTE — Telephone Encounter (Signed)
I went over the instructions with pt and her daughter was able to access instructions, both expressed understanding. Nothing further needed

## 2022-05-06 ENCOUNTER — Ambulatory Visit
Admission: RE | Admit: 2022-05-06 | Discharge: 2022-05-06 | Disposition: A | Discharge status: Home / Self Care | Payer: Medicare Other | Source: Ambulatory Visit | Attending: Internal Medicine | Admitting: Internal Medicine

## 2022-05-06 DIAGNOSIS — N2889 Other specified disorders of kidney and ureter: Secondary | ICD-10-CM | POA: Diagnosis present

## 2022-05-06 MED ORDER — GADOBUTROL 1 MMOL/ML IV SOLN
10.0000 mL | Freq: Once | INTRAVENOUS | Status: AC | PRN
  Administered 2022-05-06: 10 mL via INTRAVENOUS

## 2022-05-09 ENCOUNTER — Encounter: Payer: Self-pay | Admitting: Internal Medicine

## 2022-05-10 ENCOUNTER — Encounter: Payer: Self-pay | Admitting: Gastroenterology

## 2022-05-10 ENCOUNTER — Ambulatory Visit: Payer: Medicare Other | Admitting: Cardiology

## 2022-05-11 ENCOUNTER — Ambulatory Visit: Payer: Medicare Other | Admitting: Anesthesiology

## 2022-05-11 ENCOUNTER — Encounter
Admission: RE | Disposition: A | Discharge status: Home / Self Care | Payer: Self-pay | Source: Home / Self Care | Attending: Gastroenterology

## 2022-05-11 ENCOUNTER — Other Ambulatory Visit: Payer: Self-pay

## 2022-05-11 ENCOUNTER — Ambulatory Visit
Admission: RE | Admit: 2022-05-11 | Discharge: 2022-05-11 | Disposition: A | Discharge status: Home / Self Care | Payer: Medicare Other | Attending: Gastroenterology | Admitting: Gastroenterology

## 2022-05-11 DIAGNOSIS — K648 Other hemorrhoids: Secondary | ICD-10-CM | POA: Diagnosis not present

## 2022-05-11 DIAGNOSIS — G709 Myoneural disorder, unspecified: Secondary | ICD-10-CM | POA: Diagnosis not present

## 2022-05-11 DIAGNOSIS — I10 Essential (primary) hypertension: Secondary | ICD-10-CM | POA: Insufficient documentation

## 2022-05-11 DIAGNOSIS — K5730 Diverticulosis of large intestine without perforation or abscess without bleeding: Secondary | ICD-10-CM | POA: Insufficient documentation

## 2022-05-11 DIAGNOSIS — R933 Abnormal findings on diagnostic imaging of other parts of digestive tract: Secondary | ICD-10-CM | POA: Insufficient documentation

## 2022-05-11 DIAGNOSIS — K219 Gastro-esophageal reflux disease without esophagitis: Secondary | ICD-10-CM | POA: Insufficient documentation

## 2022-05-11 DIAGNOSIS — G4730 Sleep apnea, unspecified: Secondary | ICD-10-CM | POA: Insufficient documentation

## 2022-05-11 DIAGNOSIS — K629 Disease of anus and rectum, unspecified: Secondary | ICD-10-CM

## 2022-05-11 DIAGNOSIS — K643 Fourth degree hemorrhoids: Secondary | ICD-10-CM | POA: Insufficient documentation

## 2022-05-11 DIAGNOSIS — K6289 Other specified diseases of anus and rectum: Secondary | ICD-10-CM

## 2022-05-11 HISTORY — PX: COLONOSCOPY WITH PROPOFOL: SHX5780

## 2022-05-11 SURGERY — COLONOSCOPY WITH PROPOFOL
Anesthesia: General

## 2022-05-11 MED ORDER — EPHEDRINE SULFATE (PRESSORS) 50 MG/ML IJ SOLN
INTRAMUSCULAR | Status: DC | PRN
  Administered 2022-05-11: 10 mg via INTRAVENOUS

## 2022-05-11 MED ORDER — PROPOFOL 500 MG/50ML IV EMUL
INTRAVENOUS | Status: DC | PRN
  Administered 2022-05-11: 165 ug/kg/min via INTRAVENOUS

## 2022-05-11 MED ORDER — SODIUM CHLORIDE 0.9 % IV SOLN: INTRAVENOUS | Status: DC

## 2022-05-11 MED ORDER — PROPOFOL 10 MG/ML IV BOLUS
INTRAVENOUS | Status: DC | PRN
  Administered 2022-05-11: 60 mg via INTRAVENOUS

## 2022-05-11 MED ORDER — LIDOCAINE HCL (CARDIAC) PF 100 MG/5ML IV SOSY
PREFILLED_SYRINGE | INTRAVENOUS | Status: DC | PRN
  Administered 2022-05-11: 100 mg via INTRAVENOUS

## 2022-05-11 MED ORDER — PHENYLEPHRINE 80 MCG/ML (10ML) SYRINGE FOR IV PUSH (FOR BLOOD PRESSURE SUPPORT)
PREFILLED_SYRINGE | INTRAVENOUS | Status: DC | PRN
  Administered 2022-05-11: 80 ug via INTRAVENOUS

## 2022-05-11 NOTE — Anesthesia Procedure Notes (Signed)
Procedure Name: General with mask airway Date/Time: 05/11/2022 10:16 AM  Performed by: Mohammed Kindle, CRNAPre-anesthesia Checklist: Patient identified, Emergency Drugs available, Suction available and Patient being monitored Patient Re-evaluated:Patient Re-evaluated prior to induction Oxygen Delivery Method: Simple face mask Induction Type: IV induction Placement Confirmation: positive ETCO2, CO2 detector and breath sounds checked- equal and bilateral Dental Injury: Teeth and Oropharynx as per pre-operative assessment

## 2022-05-11 NOTE — Op Note (Signed)
[facility] Patient Name: Lindsey Jacobs Procedure Date: 05/11/2022 10:12 AM MRN: [id number] Account #: [id number] Date of Birth: 27-Oct-1935 Admit Type: Outpatient Age: 87 Room: [name] ENDO ROOM 4 Gender: Female Note Status: Finalized Instrument Name: Prentice Docker [id number] Procedure:             Colonoscopy Indications:           Abnormal CT of the GI tract Providers:             Midge Minium MD, MD Referring MD:          Lorre Munroe (Referring MD) Medicines:             Propofol per Anesthesia Complications:         No immediate complications. Procedure:             Pre-Anesthesia Assessment:                        - Prior to the procedure, a History and Physical was                         performed, and patient medications and allergies were                         reviewed. The patient's tolerance of previous                         anesthesia was also reviewed. The risks and benefits                         of the procedure and the sedation options and risks                         were discussed with the patient. All questions were                         answered, and informed consent was obtained. Prior                         Anticoagulants: The patient has taken no anticoagulant                         or antiplatelet agents. ASA Grade Assessment: II - A                         patient with mild systemic disease. After reviewing                         the risks and benefits, the patient was deemed in                         satisfactory condition to undergo the procedure.                        After obtaining informed consent, the colonoscope was                         passed under direct vision. Throughout the procedure,  the patient's blood pressure, pulse, and oxygen                         saturations were monitored continuously. The                         Colonoscope was introduced through the anus and                          advanced to the the cecum, identified by appendiceal                         orifice and ileocecal valve. The colonoscopy was                         performed without difficulty. The patient tolerated                         the procedure well. The quality of the bowel                         preparation was good. Findings:      The perianal exam findings include a large external mass.      Non-bleeding internal hemorrhoids were found during retroflexion. The       hemorrhoids were Grade IV (internal hemorrhoids that prolapse and cannot       be reduced manually).      No rectal mass      Multiple small-mouthed diverticula were found in the entire colon. Impression:            - A large external mass found on perianal exam.                        - Non-bleeding internal hemorrhoids.                        - No rectal mass                        - Diverticulosis in the entire examined colon.                        - No specimens collected. Recommendation:        - Discharge patient to home.                        - Resume previous diet.                        - Refer to a Careers adviser. Procedure Code(s):     --- Professional ---                        [phone], Colonoscopy, flexible; diagnostic, including                         collection of specimen(s) by brushing or washing, when                         performed (separate procedure) Diagnosis Code(s):     --- Professional ---  R93.3, Abnormal findings on diagnostic imaging of                         other parts of digestive tract                        K64.3, Fourth degree hemorrhoids CPT copyright 2022 American Medical Association. All rights reserved. The codes documented in this report are preliminary and upon coder review may  be revised to meet current compliance requirements. Midge Minium MD, MD 05/11/2022 10:31:48 AM This report has been signed electronically. Number of Addenda: 0 Note  Initiated On: 05/11/2022 10:12 AM Scope Withdrawal Time: 0 hours 5 minutes 48 seconds  Total Procedure Duration: 0 hours 11 minutes 55 seconds  Estimated Blood Loss:  Estimated blood loss: none.      [facility]

## 2022-05-11 NOTE — Anesthesia Postprocedure Evaluation (Signed)
Anesthesia Post Note  Patient: Lindsey Jacobs  Procedure(s) Performed: COLONOSCOPY WITH PROPOFOL  Patient location during evaluation: Endoscopy Anesthesia Type: General Level of consciousness: awake and alert Pain management: pain level controlled Vital Signs Assessment: post-procedure vital signs reviewed and stable Respiratory status: spontaneous breathing, nonlabored ventilation, respiratory function stable and patient connected to nasal cannula oxygen Cardiovascular status: blood pressure returned to baseline and stable Postop Assessment: no apparent nausea or vomiting Anesthetic complications: no   No notable events documented.   Last Vitals:  Vitals:   05/11/22 1035 05/11/22 1044  BP: (!) 86/57 118/64  Pulse: 76 75  Resp: 14 19  Temp:    SpO2: 100% 100%    Last Pain:  Vitals:   05/11/22 1044  TempSrc:   PainSc: Asleep                 Cleda Mccreedy Maybelle Depaoli

## 2022-05-11 NOTE — Anesthesia Preprocedure Evaluation (Signed)
Anesthesia Evaluation  Patient identified by MRN, date of birth, ID band Patient awake    Reviewed: Allergy & Precautions, NPO status , Patient's Chart, lab work & pertinent test results  History of Anesthesia Complications Negative for: history of anesthetic complications  Airway Mallampati: III  TM Distance: <3 FB Neck ROM: full    Dental  (+) Chipped, Partial Upper, Partial Lower, Poor Dentition, Missing   Pulmonary neg shortness of breath, sleep apnea    Pulmonary exam normal        Cardiovascular Exercise Tolerance: Good hypertension, (-) angina Normal cardiovascular exam     Neuro/Psych  PSYCHIATRIC DISORDERS       Neuromuscular disease    GI/Hepatic Neg liver ROS,GERD  Controlled,,  Endo/Other  negative endocrine ROS    Renal/GU Renal disease  negative genitourinary   Musculoskeletal   Abdominal   Peds  Hematology negative hematology ROS (+)   Anesthesia Other Findings Past Medical History: No date: Allergy No date: Colon polyps No date: Depression No date: GERD (gastroesophageal reflux disease) No date: Glaucoma No date: Hyperlipidemia No date: Hypertension No date: Sleep apnea No date: Urinary incontinence  Past Surgical History: 1984: ABDOMINAL HYSTERECTOMY No date: BREAST EXCISIONAL BIOPSY; Right     Comment:  benign 1990: CARPAL TUNNEL RELEASE; Right 2004: CATARACT EXTRACTION, BILATERAL; Bilateral 1974: CESAREAN SECTION 1974: CHOLECYSTECTOMY 05/20/2019: ESOPHAGOGASTRODUODENOSCOPY (EGD) WITH PROPOFOL; N/A     Comment:  Procedure: ESOPHAGOGASTRODUODENOSCOPY (EGD) WITH               PROPOFOL;  Surgeon: Pasty Spillers, MD;  Location:               [facility] ENDOSCOPY;  Service: Endoscopy;  Laterality: N/A; No date: EYE SURGERY No date: JOINT REPLACEMENT 2007: REPLACEMENT TOTAL KNEE; Left 2006: REPLACEMENT TOTAL KNEE BILATERAL; Right 1965: VARICOSE VEIN SURGERY; Left  BMI    Body Mass  Index: 49.92 kg/m      Reproductive/Obstetrics negative OB ROS                             Anesthesia Physical Anesthesia Plan  ASA: 3  Anesthesia Plan: General   Post-op Pain Management:    Induction: Intravenous  PONV Risk Score and Plan: Propofol infusion and TIVA  Airway Management Planned: Natural Airway and Nasal Cannula  Additional Equipment:   Intra-op Plan:   Post-operative Plan:   Informed Consent: I have reviewed the patients History and Physical, chart, labs and discussed the procedure including the risks, benefits and alternatives for the proposed anesthesia with the patient or authorized representative who has indicated his/her understanding and acceptance.     Dental Advisory Given  Plan Discussed with: Anesthesiologist, CRNA and Surgeon  Anesthesia Plan Comments: (Patient consented for risks of anesthesia including but not limited to:  - adverse reactions to medications - risk of airway placement if required - damage to eyes, teeth, lips or other oral mucosa - nerve damage due to positioning  - sore throat or hoarseness - Damage to heart, brain, nerves, lungs, other parts of body or loss of life  Patient voiced understanding.)       Anesthesia Quick Evaluation

## 2022-05-11 NOTE — Transfer of Care (Signed)
Immediate Anesthesia Transfer of Care Note  Patient: Lindsey Jacobs  Procedure(s) Performed: COLONOSCOPY WITH PROPOFOL  Patient Location: Endoscopy Unit  Anesthesia Type:General  Level of Consciousness: awake, drowsy, and patient cooperative  Airway & Oxygen Therapy: Patient Spontanous Breathing and Patient connected to face mask oxygen  Post-op Assessment: Report given to RN and Post -op Vital signs reviewed and stable  Post vital signs: Reviewed and stable  Last Vitals:  Vitals Value Taken Time  BP 86/57 05/11/22 1035  Temp 36.1 C 05/11/22 1031  Pulse 51 05/11/22 1036  Resp 18 05/11/22 1036  SpO2 100 % 05/11/22 1036  Vitals shown include unvalidated device data.  Last Pain:  Vitals:   05/11/22 1031  TempSrc: Temporal  PainSc: Asleep         Complications: No notable events documented.

## 2022-05-11 NOTE — H&P (Signed)
Midge Minium, MD [facility] [address] [place], [place] [address] Phone:[phone] Fax : [phone]  Primary Care Physician:  Lorre Munroe, NP Primary Gastroenterologist:  Dr. Servando Snare  Pre-Procedure History & Physical: HPI:  Lindsey Jacobs is a 87 y.o. female is here for an colonoscopy.   Past Medical History:  Diagnosis Date   Allergy    Colon polyps    Depression    GERD (gastroesophageal reflux disease)    Glaucoma    Hyperlipidemia    Hypertension    Sleep apnea    Urinary incontinence     Past Surgical History:  Procedure Laterality Date   ABDOMINAL HYSTERECTOMY  1984   BREAST EXCISIONAL BIOPSY Right    benign   CARPAL TUNNEL RELEASE Right 1990   CATARACT EXTRACTION, BILATERAL Bilateral 2004   CESAREAN SECTION  1974   CHOLECYSTECTOMY  1974   ESOPHAGOGASTRODUODENOSCOPY (EGD) WITH PROPOFOL N/A 05/20/2019   Procedure: ESOPHAGOGASTRODUODENOSCOPY (EGD) WITH PROPOFOL;  Surgeon: Pasty Spillers, MD;  Location: [facility] ENDOSCOPY;  Service: Endoscopy;  Laterality: N/A;   EYE SURGERY     JOINT REPLACEMENT     REPLACEMENT TOTAL KNEE Left 2007   REPLACEMENT TOTAL KNEE BILATERAL Right 2006   VARICOSE VEIN SURGERY Left 1965    Prior to Admission medications   Medication Sig Start Date End Date Taking? Authorizing Provider  DULoxetine (CYMBALTA) 60 MG capsule TAKE 1 CAPSULE DAILY (NEED APPOINTMENT IN OCTOBER) 03/21/22  Yes Baity, Salvadore Oxford, NP  furosemide (LASIX) 20 MG tablet TAKE 1 TABLET(20 MG) BY MOUTH DAILY 03/20/22  Yes Baity, Salvadore Oxford, NP  olmesartan (BENICAR) 40 MG tablet Take 1 tablet (40 mg total) by mouth daily. 01/17/22  Yes Lorre Munroe, NP  pantoprazole (PROTONIX) 40 MG tablet Take 1 tablet (40 mg total) by mouth daily. 11/21/21  Yes Baity, Salvadore Oxford, NP  Carboxymeth-Glyc-Polysorb PF (REFRESH DIGITAL PF) 0.5-1-0.5 % SOLN Apply to eye.    [provider]  Cholecalciferol (VITAMIN D3 MAXIMUM STRENGTH PO) Take by mouth.    [provider]   cycloSPORINE (RESTASIS) 0.05 % ophthalmic emulsion SMARTSIG:In Eye(s) 05/23/21   [provider]  dorzolamide-timolol (COSOPT) 22.3-6.8 MG/ML ophthalmic solution  01/18/21   [provider]  hydrALAZINE (APRESOLINE) 10 MG tablet Take 1 tablet (10 mg total) by mouth 2 (two) times daily. 04/11/22   Lorre Munroe, NP  hydrocortisone (ANUSOL-HC) 25 MG suppository Place 1 suppository (25 mg total) rectally 2 (two) times daily. For 7 days 04/14/22   Lorre Munroe, NP  latanoprost (XALATAN) 0.005 % ophthalmic solution 1 drop at bedtime. 03/16/20   [provider]  Magnesium Oxide 500 MG TABS Take by mouth. Patient is taking 2 tablet once daily.    [provider]  meclizine (ANTIVERT) 12.5 MG tablet Take 1 tablet (12.5 mg total) by mouth 3 (three) times daily as needed for dizziness. 08/18/19   Malfi, Jodelle Gross, FNP  Multiple Vitamins-Minerals (ADVANCED EYE HEALTH PO) Take by mouth.    [provider]  naproxen (NAPROSYN) 500 MG tablet Take 1 tablet (500 mg total) by mouth daily as needed for moderate pain. Take with meal 04/07/20   Smitty Cords, DO    Allergies as of 05/03/2022 - Review Complete 04/14/2022  Allergen Reaction Noted   Amoxapine and related  05/13/2018   Diamox [acetazolamide]  10/15/2018   Keflex [cephalexin] Rash 04/29/2018   Tape Rash 02/06/2018    Family History  Problem Relation Age of Onset  Heart disease Father    Colon polyps Father    Alcohol abuse Father    Breast cancer Neg Hx     Social History   Socioeconomic History   Marital status: Widowed    Spouse name: Not on file   Number of children: Not on file   Years of education: Not on file   Highest education level: Not on file  Occupational History   Occupation: retired  Tobacco Use   Smoking status: Never   Smokeless tobacco: Never  Vaping Use   Vaping Use: Never used  Substance and Sexual Activity   Alcohol use: Never   Drug use: Never   Sexual  activity: Not Currently  Other Topics Concern   Not on file  Social History Narrative   Not on file   Social Determinants of Health   Financial Resource Strain: Low Risk  (05/13/2021)   Overall Financial Resource Strain (CARDIA)    Difficulty of Paying Living Expenses: Not hard at all  Food Insecurity: No Food Insecurity (05/13/2021)   Hunger Vital Sign    Worried About Running Out of Food in the Last Year: Never true    Ran Out of Food in the Last Year: Never true  Transportation Needs: No Transportation Needs (05/13/2021)   PRAPARE - Administrator, Civil Service (Medical): No    Lack of Transportation (Non-Medical): No  Physical Activity: Inactive (05/13/2021)   Exercise Vital Sign    Days of Exercise per Week: 0 days    Minutes of Exercise per Session: 0 min  Stress: No Stress Concern Present (05/13/2021)   [employer] of Occupational Health - Occupational Stress Questionnaire    Feeling of Stress : Not at all  Social Connections: Socially Isolated (05/13/2021)   Social Connection and Isolation Panel [NHANES]    Frequency of Communication with Friends and Family: More than three times a week    Frequency of Social Gatherings with Friends and Family: Once a week    Attends Religious Services: Never    Database administrator or Organizations: No    Attends Banker Meetings: Never    Marital Status: Widowed  Intimate Partner Violence: Not At Risk (05/13/2021)   Humiliation, Afraid, Rape, and Kick questionnaire    Fear of Current or Ex-Partner: No    Emotionally Abused: No    Physically Abused: No    Sexually Abused: No    Review of Systems: See HPI, otherwise negative ROS  Physical Exam: BP 129/85   Pulse 84   Temp (!) 94.5 F (34.7 C) (Temporal)   Resp 18   Ht  (1.651 m)   Wt 136.1 kg   LMP  (LMP Unknown)   SpO2 97%   BMI 49.92 kg/m  General:   Alert,  pleasant and cooperative in NAD Head:  Normocephalic and atraumatic. Neck:   Supple; no masses or thyromegaly. Lungs:  Clear throughout to auscultation.    Heart:  Regular rate and rhythm. Abdomen:  Soft, nontender and nondistended. Normal bowel sounds, without guarding, and without rebound.   Neurologic:  Alert and  oriented x4;  grossly normal neurologically.  Impression/Plan: Lindsey Jacobs is here for an colonoscopy to be performed for rectal mass  Risks, benefits, limitations, and alternatives regarding  colonoscopy have been reviewed with the patient.  Questions have been answered.  All parties agreeable.   Midge Minium, MD  05/11/2022, 10:09 AM

## 2022-05-12 ENCOUNTER — Other Ambulatory Visit: Payer: Self-pay | Admitting: Internal Medicine

## 2022-05-12 ENCOUNTER — Encounter: Payer: Self-pay | Admitting: Gastroenterology

## 2022-05-12 DIAGNOSIS — K6289 Other specified diseases of anus and rectum: Secondary | ICD-10-CM

## 2022-05-15 ENCOUNTER — Ambulatory Visit: Payer: Self-pay | Admitting: Surgery

## 2022-05-17 ENCOUNTER — Encounter: Payer: Self-pay | Admitting: Surgery

## 2022-05-17 ENCOUNTER — Ambulatory Visit (INDEPENDENT_AMBULATORY_CARE_PROVIDER_SITE_OTHER): Payer: Medicare Other | Admitting: Surgery

## 2022-05-17 VITALS — BP 140/77 | HR 82 | Temp 97.6°F | Ht 65.0 in | Wt 299.6 lb

## 2022-05-17 DIAGNOSIS — K6289 Other specified diseases of anus and rectum: Secondary | ICD-10-CM | POA: Diagnosis not present

## 2022-05-17 NOTE — Patient Instructions (Signed)
Check this area once a week and let us know if it gets bigger or does not reduce in size in one month and we will see you again.   Follow-up with our office as needed.  Please call and ask to speak with a nurse if you develop questions or concerns.

## 2022-05-17 NOTE — Progress Notes (Signed)
05/17/2022  Reason for Visit:  Anal lesion  Requesting Provider:  Midge Minium, MD  History of Present Illness: Lindsey Jacobs is a 87 y.o. female presenting for evaluation of an anal lesion.  The patient reports that she started feeling it about 2 months ago.  Prior to that she denies feeling any masses in the anal area.  She reports that it used to be larger, about 4 inches in size, and has been getting smaller.  Denies any pain in the perianal region.  Denies any bleeding either.  Does report having one episode of some blood when wiping about 6 months ago, but nothing since.  She has seen her PCP for this and initial exam was concerning for possible abscess vs thrombosed hemorrhoid.  She has tried a course of Anusol suppository as well as a course of antibiotic.  She also had a CT scan on 05/02/22 which showed a 2.5 x 1.8 cm oval-shaped mass in the perianal region with wall enhancement, which could be an abscess.  She also had a colonoscopy with Dr. Servando Snare on 05/11/22 which found enlarged internal hemorrhoids as well as a large external mass.  The patient also reports that her stool has been smaller in caliber.  Again, denies any perianal pain or constipation.  Past Medical History: Past Medical History:  Diagnosis Date   Allergy    Colon polyps    Depression    GERD (gastroesophageal reflux disease)    Glaucoma    Hyperlipidemia    Hypertension    Sleep apnea    Urinary incontinence      Past Surgical History: Past Surgical History:  Procedure Laterality Date   ABDOMINAL HYSTERECTOMY  1984   BREAST EXCISIONAL BIOPSY Right    benign   CARPAL TUNNEL RELEASE Right 1990   CATARACT EXTRACTION, BILATERAL Bilateral 2004   CESAREAN SECTION  1974   CHOLECYSTECTOMY  1974   COLONOSCOPY WITH PROPOFOL N/A 05/11/2022   Procedure: COLONOSCOPY WITH PROPOFOL;  Surgeon: Midge Minium, MD;  Location: [facility] ENDOSCOPY;  Service: Endoscopy;  Laterality: N/A;   ESOPHAGOGASTRODUODENOSCOPY (EGD) WITH PROPOFOL  N/A 05/20/2019   Procedure: ESOPHAGOGASTRODUODENOSCOPY (EGD) WITH PROPOFOL;  Surgeon: Pasty Spillers, MD;  Location: [facility] ENDOSCOPY;  Service: Endoscopy;  Laterality: N/A;   EYE SURGERY     JOINT REPLACEMENT     REPLACEMENT TOTAL KNEE Left 2007   REPLACEMENT TOTAL KNEE BILATERAL Right 2006   VARICOSE VEIN SURGERY Left 1965    Home Medications: Prior to Admission medications   Medication Sig Start Date End Date Taking? Authorizing Provider  Carboxymeth-Glyc-Polysorb PF (REFRESH DIGITAL PF) 0.5-1-0.5 % SOLN Apply to eye.   Yes [provider]  Cholecalciferol (VITAMIN D3 MAXIMUM STRENGTH PO) Take by mouth.   Yes [provider]  cycloSPORINE (RESTASIS) 0.05 % ophthalmic emulsion SMARTSIG:In Eye(s) 05/23/21  Yes [provider]  dorzolamide-timolol (COSOPT) 22.3-6.8 MG/ML ophthalmic solution  01/18/21  Yes [provider]  DULoxetine (CYMBALTA) 60 MG capsule TAKE 1 CAPSULE DAILY (NEED APPOINTMENT IN OCTOBER) 03/21/22  Yes Baity, Salvadore Oxford, NP  furosemide (LASIX) 20 MG tablet TAKE 1 TABLET(20 MG) BY MOUTH DAILY 03/20/22  Yes Baity, Salvadore Oxford, NP  hydrALAZINE (APRESOLINE) 10 MG tablet Take 1 tablet (10 mg total) by mouth 2 (two) times daily. 04/11/22  Yes Baity, Salvadore Oxford, NP  latanoprost (XALATAN) 0.005 % ophthalmic solution 1 drop at bedtime. 03/16/20  Yes [provider]  Magnesium Oxide 500 MG TABS Take by mouth. Patient is taking 2 tablet  once daily.   Yes [provider]  Multiple Vitamins-Minerals (ADVANCED EYE HEALTH PO) Take by mouth.   Yes [provider]  naproxen (NAPROSYN) 500 MG tablet Take 1 tablet (500 mg total) by mouth daily as needed for moderate pain. Take with meal 04/07/20  Yes Karamalegos, Netta Neat, DO  olmesartan (BENICAR) 40 MG tablet Take 1 tablet (40 mg total) by mouth daily. 01/17/22  Yes Lorre Munroe, NP  pantoprazole (PROTONIX) 40 MG tablet Take 1 tablet (40 mg total) by mouth daily. 11/21/21  Yes Lorre Munroe,  NP    Allergies: Allergies  Allergen Reactions   Amoxapine And Related     Hives   Diamox [Acetazolamide]    Keflex [Cephalexin] Rash   Tape Rash    Social History:  reports that she has never smoked. She has never been exposed to tobacco smoke. She has never used smokeless tobacco. She reports that she does not drink alcohol and does not use drugs.   Family History: Family History  Problem Relation Age of Onset   Heart disease Father    Colon polyps Father    Alcohol abuse Father    Breast cancer Neg Hx     Review of Systems: Review of Systems  Constitutional:  Negative for chills and fever.  Respiratory:  Negative for shortness of breath.   Cardiovascular:  Negative for chest pain.  Gastrointestinal:  Negative for blood in stool, constipation, nausea and vomiting.       Perianal mass  Skin:  Negative for rash.    Physical Exam BP (!) 140/77   Pulse 82   Temp 97.6 F (36.4 C) (Oral)   Ht 5\' 5"  (1.651 m)   Wt 299 lb 9.6 oz (135.9 kg)   LMP  (LMP Unknown)   SpO2 95%   BMI 49.86 kg/m  CONSTITUTIONAL: No acute distress, on a wheelchair. HEENT:  Normocephalic, atraumatic, extraocular motion intact. RESPIRATORY:  Normal respiratory effort without pathologic use of accessory muscles. CARDIOVASCULAR: Regular rhythm and rate. RECTAL:  External exam reveals an enlarged 2 cm area at the site of right anterior external hemorrhoid.  This is well circumscribed shape, palpable mass like tissue deep to the skin, without any external protrusion/stalk.  There is no open wound, no erythema or induration, no purple discoloration.  Digital rectal exam reveals some enlarged internal hemorrhoids, but no bleeding. MUSCULOSKELETAL:  Normal muscle strength and tone in all four extremities.  No peripheral edema or cyanosis. SKIN: Skin turgor is normal. There are no pathologic skin lesions.  NEUROLOGIC:  Motor and sensation is grossly normal.  Cranial nerves are grossly intact. PSYCH:   Alert and oriented to person, place and time. Affect is normal.  Laboratory Analysis: No results found for this or any previous visit (from the past 24 hour(s)).  Imaging: CT abdomen/pelvis on 05/02/22: IMPRESSION: Severe wall thickening of the rectum is noted concerning for possible proctitis or neoplasm. Sigmoidoscopy is recommended.   Also noted is 2.5 x 1.8 cm oval-shaped low density with enhancing walls in the perianal region. It is uncertain if this represents small abscess or potentially focally dilated distal lumen of the rectum.   Small pericardial effusion.   9 x 7 mm partially exophytic lesion seen in midpole cortex of right kidney. Further evaluation with MRI with and without contrast administration is recommended to evaluate for possible neoplasm.   Small fat containing infraumbilical ventral hernia.  Colonoscopy 05/11/22: IMPRESSION: - A large external mass found on perianal  exam.  - Non- bleeding internal hemorrhoids.  - No rectal mass  - Diverticulosis in the entire examined colon.  - No specimens collected.  Assessment and Plan: This is a 87 y.o. female with a perianal mass  --Discussed with the patient that this may either be a previously thrombosed or flared up external hemorrhoid vs an abscess.  Currently she's asymptomatic without any tenderness to palpation of the area, no erythema or induration, or any purple discoloration.  She has gone through course of Anusol and antibiotics, and she reports that the size of the mass was much bigger before compared to the 2 cm that it is now on exam today.  This mass is under the skin, rather than protruding through.  No surgical procedures are needed at this point.  Discussed with her that her body is likely taking care of this are on its own with the help of the medications used.  This should continue to go down in size.   --Discussed with her that if this gets worse or if the size has not improved over the next month, to  contact us so we can see her again and re-evaluate the area. --Otherwise follow up as needed.  I spent 30 minutes dedicated to the care of this patient on the date of this encounter to include pre-visit review of records, face-to-face time with the patient discussing diagnosis and management, and any post-visit coordination of care.   Howie Ill, MD [facility]

## 2022-05-19 ENCOUNTER — Ambulatory Visit (INDEPENDENT_AMBULATORY_CARE_PROVIDER_SITE_OTHER): Payer: Medicare Other

## 2022-05-19 VITALS — BP 130/82 | Ht 65.0 in | Wt 299.2 lb

## 2022-05-19 DIAGNOSIS — Z0000 Encounter for general adult medical examination without abnormal findings: Secondary | ICD-10-CM

## 2022-05-19 NOTE — Progress Notes (Signed)
Subjective:   Lindsey Jacobs is a 87 y.o. female who presents for Medicare Annual (Subsequent) preventive examination.  Review of Systems     Cardiac Risk Factors include: sedentary lifestyle;hypertension;obesity (BMI >30kg/m2)     Objective:    Today's Vitals   05/19/22 0943 05/19/22 0944  BP: 130/82   Weight: 299 lb 3.2 oz (135.7 kg)   Height: 5\' 5"  (1.651 m)   PainSc:  4    Body mass index is 49.79 kg/m.     05/19/2022    9:54 AM 05/11/2022    9:53 AM 05/13/2021   10:23 AM 05/04/2020   11:07 AM 05/20/2019    7:24 AM  Advanced Directives  Does Patient Have a Medical Advance Directive? No Yes Yes Yes Yes  Type of Best boy of [place];Living will Healthcare Power of [place];Living will   Does patient want to make changes to medical advance directive?   Yes (Inpatient - patient defers changing a medical advance directive and declines information at this time)    Copy of Healthcare Power of Attorney in Chart?   Yes - validated most recent copy scanned in chart (See row information) No - copy requested   Would patient like information on creating a medical advance directive? No - Patient declined        Current Medications (verified) Outpatient Encounter Medications as of 05/19/2022  Medication Sig   Carboxymeth-Glyc-Polysorb PF (REFRESH DIGITAL PF) 0.5-1-0.5 % SOLN Apply to eye.   Cholecalciferol (VITAMIN D3 MAXIMUM STRENGTH PO) Take by mouth.   cycloSPORINE (RESTASIS) 0.05 % ophthalmic emulsion SMARTSIG:In Eye(s)   dorzolamide-timolol (COSOPT) 22.3-6.8 MG/ML ophthalmic solution    DULoxetine (CYMBALTA) 60 MG capsule TAKE 1 CAPSULE DAILY (NEED APPOINTMENT IN OCTOBER)   furosemide (LASIX) 20 MG tablet TAKE 1 TABLET(20 MG) BY MOUTH DAILY   hydrALAZINE (APRESOLINE) 10 MG tablet Take 1 tablet (10 mg total) by mouth 2 (two) times daily.   latanoprost (XALATAN) 0.005 % ophthalmic solution 1 drop at bedtime.   Magnesium Oxide 500 MG TABS Take by mouth.  Patient is taking 2 tablet once daily.   Multiple Vitamins-Minerals (ADVANCED EYE HEALTH PO) Take by mouth.   naproxen (NAPROSYN) 500 MG tablet Take 1 tablet (500 mg total) by mouth daily as needed for moderate pain. Take with meal   olmesartan (BENICAR) 40 MG tablet Take 1 tablet (40 mg total) by mouth daily.   pantoprazole (PROTONIX) 40 MG tablet Take 1 tablet (40 mg total) by mouth daily.   No facility-administered encounter medications on file as of 05/19/2022.    Allergies (verified) Amoxapine and related, Diamox [acetazolamide], Keflex [cephalexin], and Tape   History: Past Medical History:  Diagnosis Date   Allergy    Colon polyps    Depression    GERD (gastroesophageal reflux disease)    Glaucoma    Hyperlipidemia    Hypertension    Sleep apnea    Urinary incontinence    Past Surgical History:  Procedure Laterality Date   ABDOMINAL HYSTERECTOMY  1984   BREAST EXCISIONAL BIOPSY Right    benign   CARPAL TUNNEL RELEASE Right 1990   CATARACT EXTRACTION, BILATERAL Bilateral 2004   CESAREAN SECTION  1974   CHOLECYSTECTOMY  1974   COLONOSCOPY WITH PROPOFOL N/A 05/11/2022   Procedure: COLONOSCOPY WITH PROPOFOL;  Surgeon: Midge Minium, MD;  Location: [facility] ENDOSCOPY;  Service: Endoscopy;  Laterality: N/A;   ESOPHAGOGASTRODUODENOSCOPY (EGD) WITH PROPOFOL N/A 05/20/2019   Procedure: ESOPHAGOGASTRODUODENOSCOPY (EGD) WITH PROPOFOL;  Surgeon: Pasty Spillers, MD;  Location: [facility] ENDOSCOPY;  Service: Endoscopy;  Laterality: N/A;   EYE SURGERY     JOINT REPLACEMENT     REPLACEMENT TOTAL KNEE Left 2007   REPLACEMENT TOTAL KNEE BILATERAL Right 2006   VARICOSE VEIN SURGERY Left 1965   Family History  Problem Relation Age of Onset   Heart disease Father    Colon polyps Father    Alcohol abuse Father    Breast cancer Neg Hx    Social History   Socioeconomic History   Marital status: Widowed    Spouse name: Not on file   Number of children: Not on file   Years of education:  Not on file   Highest education level: Not on file  Occupational History   Occupation: retired  Tobacco Use   Smoking status: Never    Passive exposure: Never   Smokeless tobacco: Never  Vaping Use   Vaping Use: Never used  Substance and Sexual Activity   Alcohol use: Never   Drug use: Never   Sexual activity: Not Currently  Other Topics Concern   Not on file  Social History Narrative   Not on file   Social Determinants of Health   Financial Resource Strain: Low Risk  (05/19/2022)   Overall Financial Resource Strain (CARDIA)    Difficulty of Paying Living Expenses: Not hard at all  Food Insecurity: No Food Insecurity (05/19/2022)   Hunger Vital Sign    Worried About Running Out of Food in the Last Year: Never true    Ran Out of Food in the Last Year: Never true  Transportation Needs: No Transportation Needs (05/19/2022)   PRAPARE - Administrator, Civil Service (Medical): No    Lack of Transportation (Non-Medical): No  Physical Activity: Inactive (05/19/2022)   Exercise Vital Sign    Days of Exercise per Week: 0 days    Minutes of Exercise per Session: 0 min  Stress: No Stress Concern Present (05/19/2022)   [employer] of Occupational Health - Occupational Stress Questionnaire    Feeling of Stress : Not at all  Social Connections: Socially Isolated (05/19/2022)   Social Connection and Isolation Panel [NHANES]    Frequency of Communication with Friends and Family: More than three times a week    Frequency of Social Gatherings with Friends and Family: Once a week    Attends Religious Services: Never    Database administrator or Organizations: No    Attends Banker Meetings: Never    Marital Status: Widowed    Tobacco Counseling Counseling given: Not Answered   Clinical Intake:  Pre-visit preparation completed: Yes  Pain : 0-10 Pain Score: 4  Pain Type: Chronic pain Pain Location: Ankle Pain Orientation: Right, Left     Nutritional  Risks: None Diabetes: No CBG done?: No Did pt. bring in CBG monitor from home?: No  How often do you need to have someone help you when you read instructions, pamphlets, or other written materials from your doctor or pharmacy?: 1 - Never  Diabetic?no  Interpreter Needed?: No  Information entered by :: Kennedy Bucker, LPN   Activities of Daily Living    05/19/2022    9:56 AM 04/14/2022    8:56 AM  In your present state of health, do you have any difficulty performing the following activities:  Hearing? 1 1  Vision? 0 0  Difficulty concentrating or making decisions? 0 0  Walking or climbing stairs? 1  1  Dressing or bathing? 0 0  Doing errands, shopping? 1 1  Preparing Food and eating ? N   Using the Toilet? N   In the past six months, have you accidently leaked urine? N   Do you have problems with loss of bowel control? N   Managing your Medications? N   Managing your Finances? N   Housekeeping or managing your Housekeeping? Y     Patient Care Team: Lorre Munroe, NP as PCP - General (Internal Medicine)  Indicate any recent Medical Services you may have received from other than [facility] providers in the past year (date may be approximate).     Assessment:   This is a routine wellness examination for Lisbet.  Hearing/Vision screen Hearing Screening - Comments:: No aids Vision Screening - Comments:: Wears glasses- [facility]  in [place]  Dietary issues and exercise activities discussed: Current Exercise Habits: The patient does not participate in regular exercise at present   Goals Addressed             This Visit's Progress    DIET - INCREASE WATER INTAKE         Depression Screen    05/19/2022    9:50 AM 04/14/2022    8:56 AM 04/14/2022    8:55 AM 03/15/2022    9:05 AM 12/26/2021    9:38 AM 11/22/2021   11:54 AM 06/20/2021    9:39 AM  PHQ 2/9 Scores  PHQ - 2 Score 0 0 0 0 0 0 0  PHQ- 9 Score 0 0     0    Fall Risk    05/19/2022    9:55 AM 05/17/2022    9:37  AM 04/14/2022    8:55 AM 03/15/2022    9:05 AM 12/26/2021    9:38 AM  Fall Risk   Falls in the past year? 0 0 0 0 0  Number falls in past yr: 0 0     Injury with Fall? 0 0 0 0 0  Risk for fall due to : Impaired mobility;Impaired balance/gait  Impaired mobility No Fall Risks   Follow up Falls prevention discussed;Falls evaluation completed        FALL RISK PREVENTION PERTAINING TO THE HOME:  Any stairs in or around the home? No  If so, are there any without handrails? No  Home free of loose throw rugs in walkways, pet beds, electrical cords, etc? Yes  Adequate lighting in your home to reduce risk of falls? Yes   ASSISTIVE DEVICES UTILIZED TO PREVENT FALLS:  Life alert? No  Use of a cane, walker or w/c? Yes w/c and scooter Grab bars in the bathroom? Yes  Shower chair or bench in shower? Yes  Elevated toilet seat or a handicapped toilet? Yes   Sat in w/c   Cognitive Function:        05/19/2022   10:01 AM 05/04/2020   11:14 AM  6CIT Screen  What Year? 0 points 0 points  What month? 0 points 0 points  What time? 0 points 0 points  Count back from 20 0 points 0 points  Months in reverse 0 points 0 points  Repeat phrase 0 points 0 points  Total Score 0 points 0 points    Immunizations Immunization History  Administered Date(s) Administered   Fluad Quad(high Dose 65+) 10/15/2018, 11/20/2019   Influenza-Unspecified 09/27/2017, 10/18/2020, 09/16/2021   Moderna Sars-Covid-2 Vaccination 05/12/2020, 10/18/2020   PFIZER Comirnaty(Gray Top)Covid-19 Tri-Sucrose Vaccine 02/23/2019, 03/26/2019,  10/16/2019   Pneumococcal Conjugate-13 10/30/2013   Pneumococcal Polysaccharide-23 11/22/2000, 11/18/2010   Td 02/18/2001   Zoster Recombinat (Shingrix) 11/20/2019, 09/14/2020   Zoster, Live 06/19/2006    TDAP status: Due, Education has been provided regarding the importance of this vaccine. Advised may receive this vaccine at local pharmacy or Health Dept. Aware to provide a copy of the  vaccination record if obtained from local pharmacy or Health Dept. Verbalized acceptance and understanding.  Flu Vaccine status: Up to date  Pneumococcal vaccine status: Declined,  Education has been provided regarding the importance of this vaccine but patient still declined. Advised may receive this vaccine at local pharmacy or Health Dept. Aware to provide a copy of the vaccination record if obtained from local pharmacy or Health Dept. Verbalized acceptance and understanding.   Covid-19 vaccine status: Completed vaccines  Qualifies for Shingles Vaccine? Yes   Zostavax completed Yes   Shingrix Completed?: Yes  Screening Tests Health Maintenance  Topic Date Due   DTaP/Tdap/Td (2 - Tdap) 02/19/2011   COVID-19 Vaccine (6 - 2023-24 season) 09/16/2021   INFLUENZA VACCINE  08/17/2022   Medicare Annual Wellness (AWV)  05/19/2023   Pneumonia Vaccine 26+ Years old  Completed   DEXA SCAN  Completed   Zoster Vaccines- Shingrix  Completed   HPV VACCINES  Aged Out    Health Maintenance  Health Maintenance Due  Topic Date Due   DTaP/Tdap/Td (2 - Tdap) 02/19/2011   COVID-19 Vaccine (6 - 2023-24 season) 09/16/2021    Colorectal cancer screening: No longer required.   Mammogram status: No longer required due to age.   Lung Cancer Screening: (Low Dose CT Chest recommended if Age 27-80 years, 30 pack-year currently smoking OR have quit w/in 15years.) does not qualify.    Additional Screening:  Hepatitis C Screening: does not qualify; Completed no  Vision Screening: Recommended annual ophthalmology exams for early detection of glaucoma and other disorders of the eye. Is the patient up to date with their annual eye exam?  Yes  Who is the provider or what is the name of the office in which the patient attends annual eye exams? [facility] If pt is not established with a provider, would they like to be referred to a provider to establish care? No .   Dental Screening: Recommended annual  dental exams for proper oral hygiene  Community Resource Referral / Chronic Care Management: CRR required this visit?  No   CCM required this visit?  No      Plan:     I have personally reviewed and noted the following in the patient's chart:   Medical and social history Use of alcohol, tobacco or illicit drugs  Current medications and supplements including opioid prescriptions. Patient is not currently taking opioid prescriptions. Functional ability and status Nutritional status Physical activity Advanced directives List of other physicians Hospitalizations, surgeries, and ER visits in previous 12 months Vitals Screenings to include cognitive, depression, and falls Referrals and appointments  In addition, I have reviewed and discussed with patient certain preventive protocols, quality metrics, and best practice recommendations. A written personalized care plan for preventive services as well as general preventive health recommendations were provided to patient.     Hal Hope, LPN   02/23/4130   Nurse Notes: none

## 2022-05-19 NOTE — Patient Instructions (Addendum)
Lindsey Jacobs , Thank you for taking time to come for your Medicare Wellness Visit. I appreciate your ongoing commitment to your health goals. Please review the following plan we discussed and let me know if I can assist you in the future.   These are the goals we discussed:  Goals      DIET - EAT MORE FRUITS AND VEGETABLES     DIET - INCREASE WATER INTAKE     Patient Stated     05/04/2020, no goals        This is a list of the screening recommended for you and due dates:  Health Maintenance  Topic Date Due   DTaP/Tdap/Td vaccine (2 - Tdap) 02/19/2011   COVID-19 Vaccine (6 - 2023-24 season) 09/16/2021   Flu Shot  08/17/2022   Medicare Annual Wellness Visit  05/19/2023   Pneumonia Vaccine  Completed   DEXA scan (bone density measurement)  Completed   Zoster (Shingles) Vaccine  Completed   HPV Vaccine  Aged Out    Advanced directives: no  Conditions/risks identified: none  Next appointment: Follow up in one year for your annual wellness visit 05/25/23 @ 9:15 am in person   Preventive Care 65 Years and Older, Female Preventive care refers to lifestyle choices and visits with your health care provider that can promote health and wellness. What does preventive care include? A yearly physical exam. This is also called an annual well check. Dental exams once or twice a year. Routine eye exams. Ask your health care provider how often you should have your eyes checked. Personal lifestyle choices, including: Daily care of your teeth and gums. Regular physical activity. Eating a healthy diet. Avoiding tobacco and drug use. Limiting alcohol use. Practicing safe sex. Taking low-dose aspirin every day. Taking vitamin and mineral supplements as recommended by your health care provider. What happens during an annual well check? The services and screenings done by your health care provider during your annual well check will depend on your age, overall health, lifestyle risk factors, and  family history of disease. Counseling  Your health care provider may ask you questions about your: Alcohol use. Tobacco use. Drug use. Emotional well-being. Home and relationship well-being. Sexual activity. Eating habits. History of falls. Memory and ability to understand (cognition). Work and work Astronomer. Reproductive health. Screening  You may have the following tests or measurements: Height, weight, and BMI. Blood pressure. Lipid and cholesterol levels. These may be checked every 5 years, or more frequently if you are over 69 years old. Skin check. Lung cancer screening. You may have this screening every year starting at age 25 if you have a 30-pack-year history of smoking and currently smoke or have quit within the past 15 years. Fecal occult blood test (FOBT) of the stool. You may have this test every year starting at age 108. Flexible sigmoidoscopy or colonoscopy. You may have a sigmoidoscopy every 5 years or a colonoscopy every 10 years starting at age 30. Hepatitis C blood test. Hepatitis B blood test. Sexually transmitted disease (STD) testing. Diabetes screening. This is done by checking your blood sugar (glucose) after you have not eaten for a while (fasting). You may have this done every 1-3 years. Bone density scan. This is done to screen for osteoporosis. You may have this done starting at age 16. Mammogram. This may be done every 1-2 years. Talk to your health care provider about how often you should have regular mammograms. Talk with your health care provider about  your test results, treatment options, and if necessary, the need for more tests. Vaccines  Your health care provider may recommend certain vaccines, such as: Influenza vaccine. This is recommended every year. Tetanus, diphtheria, and acellular pertussis (Tdap, Td) vaccine. You may need a Td booster every 10 years. Zoster vaccine. You may need this after age 76. Pneumococcal 13-valent conjugate  (PCV13) vaccine. One dose is recommended after age 67. Pneumococcal polysaccharide (PPSV23) vaccine. One dose is recommended after age 47. Talk to your health care provider about which screenings and vaccines you need and how often you need them. This information is not intended to replace advice given to you by your health care provider. Make sure you discuss any questions you have with your health care provider. Document Released: 01/29/2015 Document Revised: 09/22/2015 Document Reviewed: 11/03/2014 Elsevier Interactive Patient Education  2017 [employer].  Fall Prevention in the Home Falls can cause injuries. They can happen to people of all ages. There are many things you can do to make your home safe and to help prevent falls. What can I do on the outside of my home? Regularly fix the edges of walkways and driveways and fix any cracks. Remove anything that might make you trip as you walk through a door, such as a raised step or threshold. Trim any bushes or trees on the path to your home. Use bright outdoor lighting. Clear any walking paths of anything that might make someone trip, such as rocks or tools. Regularly check to see if handrails are loose or broken. Make sure that both sides of any steps have handrails. Any raised decks and porches should have guardrails on the edges. Have any leaves, snow, or ice cleared regularly. Use sand or salt on walking paths during winter. Clean up any spills in your garage right away. This includes oil or grease spills. What can I do in the bathroom? Use night lights. Install grab bars by the toilet and in the tub and shower. Do not use towel bars as grab bars. Use non-skid mats or decals in the tub or shower. If you need to sit down in the shower, use a plastic, non-slip stool. Keep the floor dry. Clean up any water that spills on the floor as soon as it happens. Remove soap buildup in the tub or shower regularly. Attach bath mats securely with  double-sided non-slip rug tape. Do not have throw rugs and other things on the floor that can make you trip. What can I do in the bedroom? Use night lights. Make sure that you have a light by your bed that is easy to reach. Do not use any sheets or blankets that are too big for your bed. They should not hang down onto the floor. Have a firm chair that has side arms. You can use this for support while you get dressed. Do not have throw rugs and other things on the floor that can make you trip. What can I do in the kitchen? Clean up any spills right away. Avoid walking on wet floors. Keep items that you use a lot in easy-to-reach places. If you need to reach something above you, use a strong step stool that has a grab bar. Keep electrical cords out of the way. Do not use floor polish or wax that makes floors slippery. If you must use wax, use non-skid floor wax. Do not have throw rugs and other things on the floor that can make you trip. What can I do with  my stairs? Do not leave any items on the stairs. Make sure that there are handrails on both sides of the stairs and use them. Fix handrails that are broken or loose. Make sure that handrails are as long as the stairways. Check any carpeting to make sure that it is firmly attached to the stairs. Fix any carpet that is loose or worn. Avoid having throw rugs at the top or bottom of the stairs. If you do have throw rugs, attach them to the floor with carpet tape. Make sure that you have a light switch at the top of the stairs and the bottom of the stairs. If you do not have them, ask someone to add them for you. What else can I do to help prevent falls? Wear shoes that: Do not have high heels. Have rubber bottoms. Are comfortable and fit you well. Are closed at the toe. Do not wear sandals. If you use a stepladder: Make sure that it is fully opened. Do not climb a closed stepladder. Make sure that both sides of the stepladder are locked  into place. Ask someone to hold it for you, if possible. Clearly mark and make sure that you can see: Any grab bars or handrails. First and last steps. Where the edge of each step is. Use tools that help you move around (mobility aids) if they are needed. These include: Canes. Walkers. Scooters. Crutches. Turn on the lights when you go into a dark area. Replace any light bulbs as soon as they burn out. Set up your furniture so you have a clear path. Avoid moving your furniture around. If any of your floors are uneven, fix them. If there are any pets around you, be aware of where they are. Review your medicines with your doctor. Some medicines can make you feel dizzy. This can increase your chance of falling. Ask your doctor what other things that you can do to help prevent falls. This information is not intended to replace advice given to you by your health care provider. Make sure you discuss any questions you have with your health care provider. Document Released: 10/29/2008 Document Revised: 06/10/2015 Document Reviewed: 02/06/2014 Elsevier Interactive Patient Education  2017 [employer].

## 2022-05-23 ENCOUNTER — Encounter: Payer: Self-pay | Admitting: Gastroenterology

## 2022-06-09 ENCOUNTER — Ambulatory Visit: Payer: Medicare Other | Admitting: Family Medicine

## 2022-06-22 ENCOUNTER — Other Ambulatory Visit: Payer: Self-pay | Admitting: Internal Medicine

## 2022-06-22 NOTE — Telephone Encounter (Signed)
Requested medication (s) are due for refill today: Yes  Requested medication (s) are on the active medication list: Yes  Last refill:  04/11/22  Future visit scheduled: Yes  Notes to clinic:  Protocol indicates lab work needed.    Requested Prescriptions  Pending Prescriptions Disp Refills   hydrALAZINE (APRESOLINE) 10 MG tablet [Pharmacy Med Name: HYDRALAZINE TABS 10MG ] 180 tablet 3    Sig: TAKE 1 TABLET TWICE A DAY     Cardiovascular:  Vasodilators Failed - 06/22/2022  3:03 AM      Failed - HCT in normal range and within 360 days    HCT  Date Value Ref Range Status  06/20/2021 41.7 35.0 - 45.0 % Final         Failed - HGB in normal range and within 360 days    Hemoglobin  Date Value Ref Range Status  06/20/2021 13.4 11.7 - 15.5 g/dL Final         Failed - RBC in normal range and within 360 days    RBC  Date Value Ref Range Status  06/20/2021 4.86 3.80 - 5.10 Million/uL Final         Failed - WBC in normal range and within 360 days    WBC  Date Value Ref Range Status  06/20/2021 6.5 3.8 - 10.8 Thousand/uL Final         Failed - PLT in normal range and within 360 days    Platelets  Date Value Ref Range Status  06/20/2021 238 140 - 400 Thousand/uL Final         Failed - ANA Screen, Ifa, Serum in normal range and within 360 days    Anti Nuclear Antibody (ANA)  Date Value Ref Range Status  06/25/2019 NEGATIVE NEGATIVE Final    Comment:    ANA IFA is a first line screen for detecting the presence of up to approximately 150 autoantibodies in various autoimmune diseases. A negative ANA IFA result suggests an ANA-associated autoimmune disease is not present at this time, but is not definitive. If there is high clinical suspicion for Sjogren's syndrome, testing for anti-SS-A/Ro antibody should be considered. Anti-Jo-1 antibody should be considered for clinically suspected inflammatory myopathies. . AC-0: Negative . International Consensus on ANA  Patterns ([email or web address]) . For additional information, please refer to http://education.[employer].com/faq/FAQ177 (This link is being provided for informational/ educational purposes only.) .          Passed - Last BP in normal range    BP Readings from Last 1 Encounters:  05/19/22 130/82         Passed - Valid encounter within last 12 months    Recent Outpatient Visits           2 months ago Rectal mass   [facility], Salvadore Oxford, NP   3 months ago Essential hypertension   [place] [facility] [place], Salvadore Oxford, NP   5 months ago Pure hypercholesterolemia   [facility] [place], Salvadore Oxford, NP   7 months ago Primary hypertension   [place] [facility] [place], Salvadore Oxford, NP   1 year ago Prediabetes   [place] [facility] [place], Salvadore Oxford, NP       Future Appointments             Tomorrow Sampson Si, Salvadore Oxford, NP [place] [facility]   In 1 week Debbe Odea,  MD [facility] [place]   In 1 month Simmons-Robinson, Tawanna Cooler, MD [facility], [place]

## 2022-06-23 ENCOUNTER — Ambulatory Visit (INDEPENDENT_AMBULATORY_CARE_PROVIDER_SITE_OTHER): Payer: Medicare Other | Admitting: Internal Medicine

## 2022-06-23 ENCOUNTER — Encounter: Payer: Self-pay | Admitting: Internal Medicine

## 2022-06-23 VITALS — BP 134/86 | HR 72 | Temp 96.6°F | Wt 295.0 lb

## 2022-06-23 DIAGNOSIS — Z6841 Body Mass Index (BMI) 40.0 and over, adult: Secondary | ICD-10-CM

## 2022-06-23 DIAGNOSIS — H3530 Unspecified macular degeneration: Secondary | ICD-10-CM

## 2022-06-23 DIAGNOSIS — N1831 Chronic kidney disease, stage 3a: Secondary | ICD-10-CM

## 2022-06-23 DIAGNOSIS — K219 Gastro-esophageal reflux disease without esophagitis: Secondary | ICD-10-CM

## 2022-06-23 DIAGNOSIS — I10 Essential (primary) hypertension: Secondary | ICD-10-CM

## 2022-06-23 DIAGNOSIS — E7800 Pure hypercholesterolemia, unspecified: Secondary | ICD-10-CM

## 2022-06-23 DIAGNOSIS — R7303 Prediabetes: Secondary | ICD-10-CM | POA: Diagnosis not present

## 2022-06-23 DIAGNOSIS — M150 Primary generalized (osteo)arthritis: Secondary | ICD-10-CM

## 2022-06-23 DIAGNOSIS — G4733 Obstructive sleep apnea (adult) (pediatric): Secondary | ICD-10-CM

## 2022-06-23 DIAGNOSIS — M159 Polyosteoarthritis, unspecified: Secondary | ICD-10-CM | POA: Diagnosis not present

## 2022-06-23 DIAGNOSIS — M5412 Radiculopathy, cervical region: Secondary | ICD-10-CM

## 2022-06-23 DIAGNOSIS — F325 Major depressive disorder, single episode, in full remission: Secondary | ICD-10-CM

## 2022-06-23 DIAGNOSIS — R42 Dizziness and giddiness: Secondary | ICD-10-CM

## 2022-06-23 LAB — CBC
HCT: 41.5 % (ref 35.0–45.0)
MCHC: 33 g/dL (ref 32.0–36.0)

## 2022-06-23 MED ORDER — NAPROXEN 500 MG PO TABS
500.0000 mg | ORAL_TABLET | Freq: Every day | ORAL | 0 refills | Status: DC | PRN
Start: 2022-06-23 — End: 2023-05-24

## 2022-06-23 NOTE — Assessment & Plan Note (Signed)
Controlled on Hydralazine, Olmesartan and Furosemide Reinforced DASH diet and exercise for weight loss CMET

## 2022-06-23 NOTE — Assessment & Plan Note (Signed)
Continue naproxen as needed but avoid overuse secondary to CKD Advised her to call orthopedics for follow-up if she needs to

## 2022-06-23 NOTE — Assessment & Plan Note (Signed)
Continue treatment per ophthalmology

## 2022-06-23 NOTE — Assessment & Plan Note (Signed)
C-Met today Continue olmesartan for renal protection 

## 2022-06-23 NOTE — Patient Instructions (Signed)

## 2022-06-23 NOTE — Assessment & Plan Note (Signed)
Encouraged weight loss as this can help reduce sleep apnea symptoms Continue CPAP use. 

## 2022-06-23 NOTE — Assessment & Plan Note (Signed)
Encourage diet for weight loss 

## 2022-06-23 NOTE — Assessment & Plan Note (Signed)
Continue meclizine as needed 

## 2022-06-23 NOTE — Assessment & Plan Note (Signed)
Encourage weight loss as this can help reduce reflux symptoms Continue pantoprazole 

## 2022-06-23 NOTE — Progress Notes (Signed)
Subjective:    Patient ID: Lindsey Jacobs, female    DOB: 30-Apr-1935, 87 y.o.   MRN: [id number]  HPI  Patient presents to clinic today for 7-month follow-up of chronic conditions.  HTN: Her BP today is 134/86.  She is taking Hydralazine, Olmesartan and Furosemide as prescribed.  ECG from 02/2022 reviewed.  HLD: Her last LDL was 97, triglycerides 70, 01/2022.  She is not taking any cholesterol-lowering medication at this time.  She tries to consume low-fat diet.  CKD 3: Her last creatinine was 1.13, GFR 48, 01/2022.  She is on Olmesartan for renal protection.  She does not follow with nephrology.  Prediabetes: Her last A1c was 5.6%, 01/2022.  She is not taking any oral diabetic medication this time.  She does not check her sugars.  GERD: She is not sure what triggers this.  She denies breakthrough on Pantoprazole.  Upper GI from 05/2019 reviewed.  OSA: She averages 10 hours of sleep per night with the use of her CPAP.  There is no sleep study on file.  Depression: Chronic, managed on Duloxetine.  She is not currently seeing a therapist.  She denies anxiety, SI/HI.  Macular Degeneration: Managed with eyedrops prescribed by ophthalmology.  OA: Mainly in her back, shoulders and ankles.  She is taking Duloxetine and Naproxyn as prescribed with good relief of symptoms.  She does not follow with orthopedics.  Vertigo: Intermittent.  She takes Meclizine as needed with good relief of symptoms.  Review of Systems     Past Medical History:  Diagnosis Date   Allergy    Colon polyps    Depression    GERD (gastroesophageal reflux disease)    Glaucoma    Hyperlipidemia    Hypertension    Sleep apnea    Urinary incontinence     Current Outpatient Medications  Medication Sig Dispense Refill   Carboxymeth-Glyc-Polysorb PF (REFRESH DIGITAL PF) 0.5-1-0.5 % SOLN Apply to eye.     Cholecalciferol (VITAMIN D3 MAXIMUM STRENGTH PO) Take by mouth.     cycloSPORINE (RESTASIS) 0.05 % ophthalmic  emulsion SMARTSIG:In Eye(s)     dorzolamide-timolol (COSOPT) 22.3-6.8 MG/ML ophthalmic solution      DULoxetine (CYMBALTA) 60 MG capsule TAKE 1 CAPSULE DAILY (NEED APPOINTMENT IN OCTOBER) 90 capsule 1   furosemide (LASIX) 20 MG tablet TAKE 1 TABLET(20 MG) BY MOUTH DAILY 10 tablet 0   hydrALAZINE (APRESOLINE) 10 MG tablet TAKE 1 TABLET TWICE A DAY 180 tablet 0   latanoprost (XALATAN) 0.005 % ophthalmic solution 1 drop at bedtime.     Magnesium Oxide 500 MG TABS Take by mouth. Patient is taking 2 tablet once daily.     Multiple Vitamins-Minerals (ADVANCED EYE HEALTH PO) Take by mouth.     naproxen (NAPROSYN) 500 MG tablet Take 1 tablet (500 mg total) by mouth daily as needed for moderate pain. Take with meal 90 tablet 1   olmesartan (BENICAR) 40 MG tablet Take 1 tablet (40 mg total) by mouth daily. 90 tablet 1   pantoprazole (PROTONIX) 40 MG tablet Take 1 tablet (40 mg total) by mouth daily. 90 tablet 1   No current facility-administered medications for this visit.    Allergies  Allergen Reactions   Amoxapine And Related     Hives   Diamox [Acetazolamide]    Keflex [Cephalexin] Rash   Tape Rash    Family History  Problem Relation Age of Onset   Heart disease Father    Colon polyps Father  Alcohol abuse Father    Breast cancer Neg Hx     Social History   Socioeconomic History   Marital status: Widowed    Spouse name: Not on file   Number of children: Not on file   Years of education: Not on file   Highest education level: Not on file  Occupational History   Occupation: retired  Tobacco Use   Smoking status: Never    Passive exposure: Never   Smokeless tobacco: Never  Vaping Use   Vaping Use: Never used  Substance and Sexual Activity   Alcohol use: Never   Drug use: Never   Sexual activity: Not Currently  Other Topics Concern   Not on file  Social History Narrative   Not on file   Social Determinants of Health   Financial Resource Strain: Low Risk  (05/19/2022)    Overall Financial Resource Strain (CARDIA)    Difficulty of Paying Living Expenses: Not hard at all  Food Insecurity: No Food Insecurity (05/19/2022)   Hunger Vital Sign    Worried About Running Out of Food in the Last Year: Never true    Ran Out of Food in the Last Year: Never true  Transportation Needs: No Transportation Needs (05/19/2022)   PRAPARE - Administrator, Civil Service (Medical): No    Lack of Transportation (Non-Medical): No  Physical Activity: Inactive (05/19/2022)   Exercise Vital Sign    Days of Exercise per Week: 0 days    Minutes of Exercise per Session: 0 min  Stress: No Stress Concern Present (05/19/2022)   [employer] of Occupational Health - Occupational Stress Questionnaire    Feeling of Stress : Not at all  Social Connections: Socially Isolated (05/19/2022)   Social Connection and Isolation Panel [NHANES]    Frequency of Communication with Friends and Family: More than three times a week    Frequency of Social Gatherings with Friends and Family: Once a week    Attends Religious Services: Never    Database administrator or Organizations: No    Attends Banker Meetings: Never    Marital Status: Widowed  Intimate Partner Violence: Not At Risk (05/19/2022)   Humiliation, Afraid, Rape, and Kick questionnaire    Fear of Current or Ex-Partner: No    Emotionally Abused: No    Physically Abused: No    Sexually Abused: No     Constitutional: Denies fever, malaise, fatigue, headache or abrupt weight changes.  HEENT: Denies eye pain, eye redness, ear pain, ringing in the ears, wax buildup, runny nose, nasal congestion, bloody nose, or sore throat. Respiratory: Denies difficulty breathing, shortness of breath, cough or sputum production.   Cardiovascular: Pt reports intermittent palpitations. Denies chest pain, chest tightness, or swelling in the hands or feet.  Gastrointestinal: Denies abdominal pain, bloating, constipation, diarrhea or blood  in the stool.  GU: Denies urgency, frequency, pain with urination, burning sensation, blood in urine, odor or discharge. Musculoskeletal: Patient reports joint pain.  Denies decrease in range of motion, difficulty with gait, muscle pain or joint swelling.  Skin: Denies redness, rashes, lesions or ulcercations.  Neurological: Patient reports intermittent dizziness.  Denies difficulty with memory, difficulty with speech or problems with balance and coordination.  Psych: Patient has a history of depression.  Denies anxiety, SI/HI.  No other specific complaints in a complete review of systems (except as listed in HPI above).  Objective:   Physical Exam  BP 134/86 (BP Location: Left Wrist, Patient  Position: Sitting, Cuff Size: Normal)   Pulse 72   Temp (!) 96.6 F (35.9 C) (Temporal)   Wt 295 lb (133.8 kg)   LMP  (LMP Unknown)   SpO2 97%   BMI 49.09 kg/m   Wt Readings from Last 3 Encounters:  05/19/22 299 lb 3.2 oz (135.7 kg)  05/17/22 299 lb 9.6 oz (135.9 kg)  05/11/22 300 lb (136.1 kg)    General: Appears her stated age, obese, in NAD. Skin: Warm, dry and intact.  HEENT: Head: normal shape and size; Eyes: sclera white, no icterus, conjunctiva pink, PERRLA and EOMs intact;  Cardiovascular: Normal rate and rhythm. S1,S2 noted.  No murmur, rubs or gallops noted. No JVD or BLE edema. No carotid bruits noted. Pulmonary/Chest: Normal effort and positive vesicular breath sounds. No respiratory distress. No wheezes, rales or ronchi noted.  Abdomen:  Normal bowel sounds.  Musculoskeletal: In wheelchair. No difficulty with gait.  Neurological: Alert and oriented. Coordination normal.  Psychiatric: Mood and affect normal. Behavior is normal. Judgment and thought content normal.    BMET    Component Value Date/Time   NA 143 01/30/2022 0917   K 5.0 01/30/2022 0917   CL 105 01/30/2022 0917   CO2 30 01/30/2022 0917   GLUCOSE 91 01/30/2022 0917   BUN 15 01/30/2022 0917   CREATININE 1.50  (H) 05/02/2022 1323   CREATININE 1.13 (H) 01/30/2022 0917   CALCIUM 10.2 01/30/2022 0917   GFRNONAA 54 (L) 06/25/2019 1139   GFRAA 63 06/25/2019 1139    Lipid Panel     Component Value Date/Time   CHOL 179 01/30/2022 0917   TRIG 70 01/30/2022 0917   HDL 66 01/30/2022 0917   CHOLHDL 2.7 01/30/2022 0917   LDLCALC 97 01/30/2022 0917    CBC    Component Value Date/Time   WBC 6.5 06/20/2021 0937   RBC 4.86 06/20/2021 0937   HGB 13.4 06/20/2021 0937   HCT 41.7 06/20/2021 0937   PLT 238 06/20/2021 0937   MCV 85.8 06/20/2021 0937   MCH 27.6 06/20/2021 0937   MCHC 32.1 06/20/2021 0937   RDW 14.6 06/20/2021 0937   LYMPHSABS 1,592 06/25/2019 1139   EOSABS 520 (H) 06/25/2019 1139   BASOSABS 48 06/25/2019 1139    Hgb A1C Lab Results  Component Value Date   HGBA1C 5.6 01/30/2022           Assessment & Plan:      No need for follow-up as she will be establishing care with [facility] in July 2024 Nicki Reaper, NP

## 2022-06-23 NOTE — Assessment & Plan Note (Signed)
She is having more right-sided neck pain Continue naproxen as needed but avoid overuse secondary to CKD Advised her to call orthopedics for follow-up if she needs to

## 2022-06-23 NOTE — Assessment & Plan Note (Signed)
A1c today Encourage low-carb diet 

## 2022-06-23 NOTE — Assessment & Plan Note (Signed)
Continue duloxetine Support offered 

## 2022-06-23 NOTE — Assessment & Plan Note (Signed)
C-Met and lipid profile today Encouraged her to consume low-fat diet 

## 2022-06-24 LAB — COMPLETE METABOLIC PANEL WITH GFR
AG Ratio: 1.4 (calc) (ref 1.0–2.5)
ALT: 7 U/L (ref 6–29)
AST: 15 U/L (ref 10–35)
Albumin: 4.1 g/dL (ref 3.6–5.1)
Alkaline phosphatase (APISO): 93 U/L (ref 37–153)
BUN/Creatinine Ratio: 10 (calc) (ref 6–22)
BUN: 12 mg/dL (ref 7–25)
CO2: 28 mmol/L (ref 20–32)
Calcium: 9.9 mg/dL (ref 8.6–10.4)
Chloride: 104 mmol/L (ref 98–110)
Creat: 1.15 mg/dL — ABNORMAL HIGH (ref 0.60–0.95)
Globulin: 2.9 g/dL (calc) (ref 1.9–3.7)
Glucose, Bld: 93 mg/dL (ref 65–99)
Potassium: 4.7 mmol/L (ref 3.5–5.3)
Sodium: 141 mmol/L (ref 135–146)
Total Bilirubin: 0.8 mg/dL (ref 0.2–1.2)
Total Protein: 7 g/dL (ref 6.1–8.1)
eGFR: 47 mL/min/{1.73_m2} — ABNORMAL LOW (ref >=60)

## 2022-06-24 LAB — LIPID PANEL
Cholesterol: 184 mg/dL (ref <200)
HDL: 66 mg/dL (ref >=50)
LDL Cholesterol (Calc): 99 mg/dL (calc)
Non-HDL Cholesterol (Calc): 118 mg/dL (calc) (ref <130)
Total CHOL/HDL Ratio: 2.8 (calc) (ref <5.0)
Triglycerides: 98 mg/dL (ref <150)

## 2022-06-24 LAB — CBC
Hemoglobin: 13.7 g/dL (ref 11.7–15.5)
MCH: 28.1 pg (ref 27.0–33.0)
MCV: 85 fL (ref 80.0–100.0)
MPV: 10.1 fL (ref 7.5–12.5)
Platelets: 242 10*3/uL (ref 140–400)
RBC: 4.88 10*6/uL (ref 3.80–5.10)
RDW: 15.4 % — ABNORMAL HIGH (ref 11.0–15.0)
WBC: 6 10*3/uL (ref 3.8–10.8)

## 2022-06-24 LAB — HEMOGLOBIN A1C
Hgb A1c MFr Bld: 5.6 % of total Hgb (ref <5.7)
Mean Plasma Glucose: 114 mg/dL
eAG (mmol/L): 6.3 mmol/L

## 2022-06-26 ENCOUNTER — Ambulatory Visit: Payer: Medicare Other | Admitting: Internal Medicine

## 2022-06-28 ENCOUNTER — Other Ambulatory Visit: Payer: Self-pay | Admitting: Internal Medicine

## 2022-06-29 NOTE — Telephone Encounter (Signed)
Requested Prescriptions  Pending Prescriptions Disp Refills   olmesartan (BENICAR) 40 MG tablet [Pharmacy Med Name: OLMESARTAN MEDOXOMIL TABS 40MG ] 90 tablet 0    Sig: TAKE 1 TABLET DAILY     Cardiovascular:  Angiotensin Receptor Blockers Failed - 06/28/2022  3:11 AM      Failed - Cr in normal range and within 180 days    Creat  Date Value Ref Range Status  06/23/2022 1.15 (H) 0.60 - 0.95 mg/dL Final         Passed - K in normal range and within 180 days    Potassium  Date Value Ref Range Status  06/23/2022 4.7 3.5 - 5.3 mmol/L Final         Passed - Patient is not pregnant      Passed - Last BP in normal range    BP Readings from Last 1 Encounters:  06/23/22 134/86         Passed - Valid encounter within last 6 months    Recent Outpatient Visits           6 days ago Prediabetes   [place] [facility] [place], Salvadore Oxford, NP   2 months ago Rectal mass   [facility] [place], Salvadore Oxford, NP   3 months ago Essential hypertension   [place] [facility] [place], Salvadore Oxford, NP   6 months ago Pure hypercholesterolemia   [place] [facility] [place], Salvadore Oxford, NP   7 months ago Primary hypertension   [place] [facility] [place], Salvadore Oxford, NP       Future Appointments             In 6 days Agbor-Etang, Arlys John, MD [facility] [place]   In 4 weeks Simmons-Robinson, Tawanna Cooler, MD [facility]

## 2022-07-05 ENCOUNTER — Ambulatory Visit: Payer: Medicare Other | Attending: Cardiology | Admitting: Cardiology

## 2022-07-05 ENCOUNTER — Encounter: Payer: Self-pay | Admitting: Cardiology

## 2022-07-05 VITALS — BP 112/80 | HR 76 | Ht 65.0 in | Wt 297.4 lb

## 2022-07-05 DIAGNOSIS — R002 Palpitations: Secondary | ICD-10-CM | POA: Diagnosis present

## 2022-07-05 DIAGNOSIS — I10 Essential (primary) hypertension: Secondary | ICD-10-CM | POA: Diagnosis present

## 2022-07-05 NOTE — Patient Instructions (Signed)
Medication Instructions:   Your physician recommends that you continue on your current medications as directed. Please refer to the Current Medication list given to you today.  *If you need a refill on your cardiac medications before your next appointment, please call your pharmacy*   Lab Work:  None Ordered  If you have labs (blood work) drawn today and your tests are completely normal, you will receive your results only by: MyChart Message (if you have MyChart) OR A paper copy in the mail If you have any lab test that is abnormal or we need to change your treatment, we will call you to review the results.   Testing/Procedures:  None Ordered   Follow-Up: At [facility], you and your health needs are our priority.  As part of our continuing mission to provide you with exceptional heart care, we have created designated Provider Care Teams.  These Care Teams include your primary Cardiologist (physician) and Advanced Practice Providers (APPs -  Physician Assistants and Nurse Practitioners) who all work together to provide you with the care you need, when you need it.  We recommend signing up for the patient portal called "MyChart".  Sign up information is provided on this After Visit Summary.  MyChart is used to connect with patients for Virtual Visits (Telemedicine).  Patients are able to view lab/test results, encounter notes, upcoming appointments, etc.  Non-urgent messages can be sent to your provider as well.   To learn more about what you can do with MyChart, go to [email or web address].    Your next appointment:    As needed 

## 2022-07-05 NOTE — Progress Notes (Signed)
Cardiology Office Note:    Date:  07/05/2022   ID:  Lindsey Jacobs, DOB 05/29/1935, MRN [id number]  PCP:  Lorre Munroe, NP   [facility] Providers Cardiologist:  None     Referring MD: Lorre Munroe, NP   Chief Complaint  Patient presents with   New Patient (Initial Visit)    Patient reports intermittent palpitations for past 6 months with no cardiac history.  Heart monitor ordered by PCP with results available in chart.    History of Present Illness:    Lindsey Jacobs is a 87 y.o. female with a hx of hypertension, GERD who presents due to palpitations.  Symptoms of palpitations been ongoing for several months beginning 6 months ago.  She states having a rectal infection at the time.  Was treated with antibiotics, her blood pressure medications were also adjusted.  Recently moved to the area from [place].  Symptoms seem to improved after treatment with antibiotics.  Cardiac monitor x 2 weeks was placed by primary care physician.  Monitor did reveal rare ectopies/PACs, occasional Mobitz 1 AV block around 4 AM likely while patient sleeping, no significant sustained arrhythmias.  Currently feels well, denies any palpitations.  Blood pressure controlled on current medications.  No adverse effects on current medicines.  Past Medical History:  Diagnosis Date   Allergy    Colon polyps    Depression    GERD (gastroesophageal reflux disease)    Glaucoma    Hyperlipidemia    Hypertension    Sleep apnea    Urinary incontinence     Past Surgical History:  Procedure Laterality Date   ABDOMINAL HYSTERECTOMY  1984   BREAST EXCISIONAL BIOPSY Right    benign   CARPAL TUNNEL RELEASE Right 1990   CATARACT EXTRACTION, BILATERAL Bilateral 2004   CESAREAN SECTION  1974   CHOLECYSTECTOMY  1974   COLONOSCOPY WITH PROPOFOL N/A 05/11/2022   Procedure: COLONOSCOPY WITH PROPOFOL;  Surgeon: Midge Minium, MD;  Location: [facility] ENDOSCOPY;  Service: Endoscopy;  Laterality: N/A;    ESOPHAGOGASTRODUODENOSCOPY (EGD) WITH PROPOFOL N/A 05/20/2019   Procedure: ESOPHAGOGASTRODUODENOSCOPY (EGD) WITH PROPOFOL;  Surgeon: Pasty Spillers, MD;  Location: [facility] ENDOSCOPY;  Service: Endoscopy;  Laterality: N/A;   EYE SURGERY     JOINT REPLACEMENT     REPLACEMENT TOTAL KNEE Left 2007   REPLACEMENT TOTAL KNEE BILATERAL Right 2006   VARICOSE VEIN SURGERY Left 1965    Current Medications: Current Meds  Medication Sig   Carboxymeth-Glyc-Polysorb PF (REFRESH DIGITAL PF) 0.5-1-0.5 % SOLN Apply to eye.   Cholecalciferol (VITAMIN D3 MAXIMUM STRENGTH PO) Take by mouth.   cycloSPORINE (RESTASIS) 0.05 % ophthalmic emulsion SMARTSIG:In Eye(s)   dorzolamide-timolol (COSOPT) 22.3-6.8 MG/ML ophthalmic solution    DULoxetine (CYMBALTA) 60 MG capsule TAKE 1 CAPSULE DAILY (NEED APPOINTMENT IN OCTOBER)   furosemide (LASIX) 20 MG tablet TAKE 1 TABLET(20 MG) BY MOUTH DAILY   hydrALAZINE (APRESOLINE) 10 MG tablet TAKE 1 TABLET TWICE A DAY   latanoprost (XALATAN) 0.005 % ophthalmic solution 1 drop at bedtime.   Magnesium Oxide 500 MG TABS Take by mouth. Patient is taking 2 tablet once daily.   Multiple Vitamins-Minerals (ADVANCED EYE HEALTH PO) Take by mouth.   naproxen (NAPROSYN) 500 MG tablet Take 1 tablet (500 mg total) by mouth daily as needed for moderate pain. Take with meal   olmesartan (BENICAR) 40 MG tablet TAKE 1 TABLET DAILY   pantoprazole (PROTONIX) 40 MG tablet Take 1 tablet (40 mg total) by mouth  daily.     Allergies:   Amoxapine and related, Amoxicillin, Diamox [acetazolamide], Keflex [cephalexin], and Tape   Social History   Socioeconomic History   Marital status: Widowed    Spouse name: Not on file   Number of children: Not on file   Years of education: Not on file   Highest education level: Not on file  Occupational History   Occupation: retired  Tobacco Use   Smoking status: Never    Passive exposure: Never   Smokeless tobacco: Never  Vaping Use   Vaping Use: Never  used  Substance and Sexual Activity   Alcohol use: Never   Drug use: Never   Sexual activity: Not Currently  Other Topics Concern   Not on file  Social History Narrative   Not on file   Social Determinants of Health   Financial Resource Strain: Low Risk  (05/19/2022)   Overall Financial Resource Strain (CARDIA)    Difficulty of Paying Living Expenses: Not hard at all  Food Insecurity: No Food Insecurity (05/19/2022)   Hunger Vital Sign    Worried About Running Out of Food in the Last Year: Never true    Ran Out of Food in the Last Year: Never true  Transportation Needs: No Transportation Needs (05/19/2022)   PRAPARE - Administrator, Civil Service (Medical): No    Lack of Transportation (Non-Medical): No  Physical Activity: Inactive (05/19/2022)   Exercise Vital Sign    Days of Exercise per Week: 0 days    Minutes of Exercise per Session: 0 min  Stress: No Stress Concern Present (05/19/2022)   [employer] of Occupational Health - Occupational Stress Questionnaire    Feeling of Stress : Not at all  Social Connections: Socially Isolated (05/19/2022)   Social Connection and Isolation Panel [NHANES]    Frequency of Communication with Friends and Family: More than three times a week    Frequency of Social Gatherings with Friends and Family: Once a week    Attends Religious Services: Never    Database administrator or Organizations: No    Attends Banker Meetings: Never    Marital Status: Widowed     Family History: The patient's family history includes Alcohol abuse in her father; Colon polyps in her father; Heart disease in her father. There is no history of Breast cancer.  ROS:   Please see the history of present illness.     All other systems reviewed and are negative.  EKGs/Labs/Other Studies Reviewed:    The following studies were reviewed today:   EKG obtained, EKG shows normal sinus rhythm  Recent Labs: 11/22/2021: TSH 2.32 06/23/2022: ALT 7;  BUN 12; Creat 1.15; Hemoglobin 13.7; Platelets 242; Potassium 4.7; Sodium 141  Recent Lipid Panel    Component Value Date/Time   CHOL 184 06/23/2022 1001   TRIG 98 06/23/2022 1001   HDL 66 06/23/2022 1001   CHOLHDL 2.8 06/23/2022 1001   LDLCALC 99 06/23/2022 1001     Risk Assessment/Calculations:             Physical Exam:    VS:  BP 112/80 (BP Location: Right Arm, Patient Position: Sitting, Cuff Size: Large)   Pulse 76   Ht 5\' 5"  (1.651 m)   Wt 297 lb 6.4 oz (134.9 kg)   LMP  (LMP Unknown)   SpO2 97%   BMI 49.49 kg/m     Wt Readings from Last 3 Encounters:  07/05/22 297 lb 6.4  oz (134.9 kg)  06/23/22 295 lb (133.8 kg)  05/19/22 299 lb 3.2 oz (135.7 kg)     GEN:  Well nourished, well developed in no acute distress HEENT: Normal NECK: No JVD; No carotid bruits CARDIAC: RRR, no murmurs, rubs, gallops RESPIRATORY:  Clear to auscultation without rales, wheezing or rhonchi  ABDOMEN: Soft, non-tender, non-distended MUSCULOSKELETAL:  No edema; No deformity  SKIN: Warm and dry NEUROLOGIC:  Alert and oriented x 3 PSYCHIATRIC:  Normal affect   ASSESSMENT:    1. Palpitations   2. Primary hypertension   3. Morbid obesity (HCC)    PLAN:    In order of problems listed above:  Palpitations coinciding with rectal infection and medication adjustments.  Symptoms improved over the past 2 to 3 months.  Cardiac monitor showed no sustained arrhythmias.  Mobitz 1 block noted occurring around 4:30 AM, patient likely asleep. patient made aware of results, reassured. Hypertension, BP controlled.  Continue Benicar, hydralazine. Morbid obesity, recommend low-calorie diet, weight loss .  Follow-up as needed     Medication Adjustments/Labs and Tests Ordered: Current medicines are reviewed at length with the patient today.  Concerns regarding medicines are outlined above.  Orders Placed This Encounter  Procedures   EKG 12-Lead   No orders of the defined types were placed in this  encounter.   Patient Instructions  Medication Instructions:   Your physician recommends that you continue on your current medications as directed. Please refer to the Current Medication list given to you today.  *If you need a refill on your cardiac medications before your next appointment, please call your pharmacy*   Lab Work:  None Ordered  If you have labs (blood work) drawn today and your tests are completely normal, you will receive your results only by: MyChart Message (if you have MyChart) OR A paper copy in the mail If you have any lab test that is abnormal or we need to change your treatment, we will call you to review the results.   Testing/Procedures:  None Ordered   Follow-Up: At [facility], you and your health needs are our priority.  As part of our continuing mission to provide you with exceptional heart care, we have created designated Provider Care Teams.  These Care Teams include your primary Cardiologist (physician) and Advanced Practice Providers (APPs -  Physician Assistants and Nurse Practitioners) who all work together to provide you with the care you need, when you need it.  We recommend signing up for the patient portal called "MyChart".  Sign up information is provided on this After Visit Summary.  MyChart is used to connect with patients for Virtual Visits (Telemedicine).  Patients are able to view lab/test results, encounter notes, upcoming appointments, etc.  Non-urgent messages can be sent to your provider as well.   To learn more about what you can do with MyChart, go to [email or web address].    Your next appointment:    As needed      Signed, Debbe Odea, MD  07/05/2022 9:59 AM    [facility]

## 2022-07-21 ENCOUNTER — Other Ambulatory Visit: Payer: Self-pay

## 2022-07-21 DIAGNOSIS — K219 Gastro-esophageal reflux disease without esophagitis: Secondary | ICD-10-CM

## 2022-07-21 MED ORDER — PANTOPRAZOLE SODIUM 40 MG PO TBEC
40.0000 mg | DELAYED_RELEASE_TABLET | Freq: Every day | ORAL | 0 refills | Status: DC
Start: 2022-07-21 — End: 2022-07-21

## 2022-07-21 MED ORDER — PANTOPRAZOLE SODIUM 40 MG PO TBEC
40.0000 mg | DELAYED_RELEASE_TABLET | Freq: Every day | ORAL | 1 refills | Status: DC
Start: 2022-07-21 — End: 2023-03-12

## 2022-07-28 ENCOUNTER — Encounter: Payer: Self-pay | Admitting: Family Medicine

## 2022-07-28 ENCOUNTER — Ambulatory Visit (INDEPENDENT_AMBULATORY_CARE_PROVIDER_SITE_OTHER): Payer: Medicare Other | Admitting: Family Medicine

## 2022-07-28 VITALS — BP 126/79 | HR 84 | Ht 65.0 in | Wt 297.0 lb

## 2022-07-28 DIAGNOSIS — Z7689 Persons encountering health services in other specified circumstances: Secondary | ICD-10-CM | POA: Diagnosis not present

## 2022-07-28 DIAGNOSIS — G4733 Obstructive sleep apnea (adult) (pediatric): Secondary | ICD-10-CM

## 2022-07-28 DIAGNOSIS — E7800 Pure hypercholesterolemia, unspecified: Secondary | ICD-10-CM

## 2022-07-28 DIAGNOSIS — R7303 Prediabetes: Secondary | ICD-10-CM

## 2022-07-28 DIAGNOSIS — I10 Essential (primary) hypertension: Secondary | ICD-10-CM | POA: Diagnosis not present

## 2022-07-28 DIAGNOSIS — Z6841 Body Mass Index (BMI) 40.0 and over, adult: Secondary | ICD-10-CM

## 2022-07-28 DIAGNOSIS — F325 Major depressive disorder, single episode, in full remission: Secondary | ICD-10-CM

## 2022-07-28 DIAGNOSIS — N1831 Chronic kidney disease, stage 3a: Secondary | ICD-10-CM

## 2022-07-28 DIAGNOSIS — R3981 Functional urinary incontinence: Secondary | ICD-10-CM

## 2022-07-28 DIAGNOSIS — K219 Gastro-esophageal reflux disease without esophagitis: Secondary | ICD-10-CM

## 2022-07-28 DIAGNOSIS — M159 Polyosteoarthritis, unspecified: Secondary | ICD-10-CM

## 2022-07-28 NOTE — Assessment & Plan Note (Signed)
Complete loss of bladder control, managed with diapers. -No further intervention at this time as patient is comfortable with current management.

## 2022-07-28 NOTE — Assessment & Plan Note (Signed)
Present in multiple joints including shoulders, ankles, and cervical spine. Pain managed with Naproxen 500mg  as needed. -Continue Naproxen as needed given normal creatinine -Consider orthopedic consultation for further management options. -Patient is followed at [facility] clinic orthopedics

## 2022-07-28 NOTE — Patient Instructions (Addendum)
It was a pleasure meeting you today!  Welcome to [facility].  I look forward to taking part in your care as your new primary care physician.    Summary of our discussion today:   Please continue your medications as prescribed  I have reviewed your labs from June 2024 and everything looks normal & your kidney function is improved from April   Please remember to schedule your annual physical one year from your last physical.   You should return to our clinic in 6 months for chronic conditions follow up.   Best Wishes,   Dr. Roxan Hockey

## 2022-07-28 NOTE — Assessment & Plan Note (Signed)
Chronic Encouraged physical activity with seated exercises as tolerable given history of arthritis and difficulty with mobility Recommended monitoring dietary intake as stated above for prediabetes

## 2022-07-28 NOTE — Assessment & Plan Note (Signed)
Well controlled on Benicar 40mg  daily, Hydralazine 10mg  twice daily, and Lasix 20mg  daily. Recent change in antihypertensive medication led to transient increase in blood pressure, now resolved. -Chronic -Blood pressure at goalx -Continue current regimen. -Check blood pressure regularly.

## 2022-07-28 NOTE — Assessment & Plan Note (Signed)
Recent improvement in creatinine from 1.5 to 1.15 in June 24 -Chronic -Stable -Monitor kidney function regularly.

## 2022-07-28 NOTE — Progress Notes (Signed)
New patient visit   Patient: Lindsey Jacobs   DOB: [birth year]   87 y.o. Female  MRN: [id number] Visit Date: 07/28/2022  Today's healthcare provider: Ronnald Ramp, MD   Chief Complaint  Patient presents with   Establish Care   Subjective    Lindsey Jacobs is a 87 y.o. female who presents today as a new patient to establish care.   HPI   Establish-- Last edited by Shelly Bombard, CMA on 07/28/2022  9:12 AM.       The patient, a senior citizen with a complex medical history, presented with multiple ongoing health concerns.   HTN  The primary concern was blood pressure management, which had been unstable following a recent medication change. The patient reported a history of hypertension, managed with Benicar, Hydralazine, and Lasix. However, after the medication change, the patient experienced palpitations and could feel her heart beating in her hands and arms, particularly at night.  Sleep apnea, nightly CPAP use The patient also reported a history of sleep apnea, managed with a nightly CPAP machine. She reported no issues with snoring or cessation of breathing during sleep.  Arthritis  The patient has a history of arthritis, affecting multiple joints including the ankles and shoulders. The shoulder pain was particularly severe, with the patient reporting a previous recommendation for surgery. However, the patient opted for periodic injections, which provided significant relief.  PreDiabetes  Morbid obesity BMI 49 The patient also reported a history of prediabetes, with the most recent A1C at 5.6, indicating good control. She also reported a history of reflux, which had been well controlled with Pantoprazole for the past ten years.  The patient reported a recent issue with a hemorrhoid or varicose vein, which was large and hard but not painful. After a course of antibiotics, the issue resolved, but it was noted that this may have impacted her kidney function  temporarily.  The patient also reported a history of cervical arthritis, which had been causing headaches.   Urinary Incontinence  She also reported a loss of bladder control, managed with diapers.  The patient has a history of flat feet and osteoporosis in the feet, with a previous recommendation for surgery. However, the patient opted not to pursue this.   The patient has a history of multiple children, three of whom have passed away. She reported moving in with her daughter Kriste Basque) five years ago due to mobility issues.        Past Medical History:  Diagnosis Date   Allergy    Colon polyps    Depression    GERD (gastroesophageal reflux disease)    Glaucoma    Hyperlipidemia    Hypertension    Sleep apnea    Urinary incontinence    Past Surgical History:  Procedure Laterality Date   ABDOMINAL HYSTERECTOMY  1984   BREAST EXCISIONAL BIOPSY Right    benign   CARPAL TUNNEL RELEASE Right 1990   CATARACT EXTRACTION, BILATERAL Bilateral 2004   CESAREAN SECTION  1974   CHOLECYSTECTOMY  1974   COLONOSCOPY WITH PROPOFOL N/A 05/11/2022   Procedure: COLONOSCOPY WITH PROPOFOL;  Surgeon: Midge Minium, MD;  Location: [facility] ENDOSCOPY;  Service: Endoscopy;  Laterality: N/A;   ESOPHAGOGASTRODUODENOSCOPY (EGD) WITH PROPOFOL N/A 05/20/2019   Procedure: ESOPHAGOGASTRODUODENOSCOPY (EGD) WITH PROPOFOL;  Surgeon: Pasty Spillers, MD;  Location: [facility] ENDOSCOPY;  Service: Endoscopy;  Laterality: N/A;   EYE SURGERY     JOINT REPLACEMENT     REPLACEMENT TOTAL KNEE Left 2007  REPLACEMENT TOTAL KNEE BILATERAL Right 2006   VARICOSE VEIN SURGERY Left 1965   Family Status  Relation Name Status   Father  Deceased   Neg Hx  (Not Specified)  No partnership data on file   Family History  Problem Relation Age of Onset   Heart disease Father    Colon polyps Father    Alcohol abuse Father    Breast cancer Neg Hx    Social History   Socioeconomic History   Marital status: Widowed    Spouse  name: Not on file   Number of children: Not on file   Years of education: Not on file   Highest education level: Not on file  Occupational History   Occupation: retired  Tobacco Use   Smoking status: Never    Passive exposure: Never   Smokeless tobacco: Never  Vaping Use   Vaping status: Never Used  Substance and Sexual Activity   Alcohol use: Never   Drug use: Never   Sexual activity: Not Currently  Other Topics Concern   Not on file  Social History Narrative   Not on file   Social Determinants of Health   Financial Resource Strain: Low Risk  (05/19/2022)   Overall Financial Resource Strain (CARDIA)    Difficulty of Paying Living Expenses: Not hard at all  Food Insecurity: No Food Insecurity (05/19/2022)   Hunger Vital Sign    Worried About Running Out of Food in the Last Year: Never true    Ran Out of Food in the Last Year: Never true  Transportation Needs: No Transportation Needs (05/19/2022)   PRAPARE - Administrator, Civil Service (Medical): No    Lack of Transportation (Non-Medical): No  Physical Activity: Inactive (05/19/2022)   Exercise Vital Sign    Days of Exercise per Week: 0 days    Minutes of Exercise per Session: 0 min  Stress: No Stress Concern Present (05/19/2022)   [employer] of Occupational Health - Occupational Stress Questionnaire    Feeling of Stress : Not at all  Social Connections: Socially Isolated (05/19/2022)   Social Connection and Isolation Panel [NHANES]    Frequency of Communication with Friends and Family: More than three times a week    Frequency of Social Gatherings with Friends and Family: Once a week    Attends Religious Services: Never    Database administrator or Organizations: No    Attends Banker Meetings: Never    Marital Status: Widowed    Outpatient Medications Prior to Visit  Medication Sig   Carboxymeth-Glyc-Polysorb PF (REFRESH DIGITAL PF) 0.5-1-0.5 % SOLN Apply to eye.   Cholecalciferol (VITAMIN  D3 MAXIMUM STRENGTH PO) Take by mouth.   cycloSPORINE (RESTASIS) 0.05 % ophthalmic emulsion SMARTSIG:In Eye(s)   dorzolamide-timolol (COSOPT) 22.3-6.8 MG/ML ophthalmic solution    DULoxetine (CYMBALTA) 60 MG capsule TAKE 1 CAPSULE DAILY (NEED APPOINTMENT IN OCTOBER)   furosemide (LASIX) 20 MG tablet TAKE 1 TABLET(20 MG) BY MOUTH DAILY   hydrALAZINE (APRESOLINE) 10 MG tablet TAKE 1 TABLET TWICE A DAY   latanoprost (XALATAN) 0.005 % ophthalmic solution 1 drop at bedtime.   Magnesium Oxide 500 MG TABS Take by mouth. Patient is taking 2 tablet once daily.   Multiple Vitamins-Minerals (ADVANCED EYE HEALTH PO) Take by mouth.   naproxen (NAPROSYN) 500 MG tablet Take 1 tablet (500 mg total) by mouth daily as needed for moderate pain. Take with meal   olmesartan (BENICAR) 40 MG tablet TAKE 1  TABLET DAILY   pantoprazole (PROTONIX) 40 MG tablet Take 1 tablet (40 mg total) by mouth daily.   No facility-administered medications prior to visit.   Allergies  Allergen Reactions   Amoxapine And Related     Hives   Amoxicillin Hives   Diamox [Acetazolamide]    Keflex [Cephalexin] Rash   Tape Rash    Immunization History  Administered Date(s) Administered   Fluad Quad(Jacobs Dose 65+) 10/15/2018, 11/20/2019   Influenza-Unspecified 09/27/2017, 10/18/2020, 09/16/2021   Moderna Sars-Covid-2 Vaccination 05/12/2020, 10/18/2020   PFIZER Comirnaty(Gray Top)Covid-19 Tri-Sucrose Vaccine 02/23/2019, 03/26/2019, 10/16/2019   Pneumococcal Conjugate-13 10/30/2013   Pneumococcal Polysaccharide-23 11/22/2000, 11/18/2010   Td 02/18/2001   Zoster Recombinant(Shingrix) 11/20/2019, 09/14/2020   Zoster, Live 06/19/2006    Health Maintenance  Topic Date Due   DTaP/Tdap/Td (2 - Tdap) 02/19/2011   INFLUENZA VACCINE  08/17/2022   Medicare Annual Wellness (AWV)  05/19/2023   Pneumonia Vaccine 64+ Years old  Completed   DEXA SCAN  Completed   Zoster Vaccines- Shingrix  Completed   HPV VACCINES  Aged Out   COVID-19  Vaccine  Discontinued    Patient Care Team: Ronnald Ramp, MD as PCP - General (Family Medicine)  Review of Systems     Objective    BP 126/79   Pulse 84   Ht 5\' 5"  (1.651 m)   Wt 297 lb (134.7 kg)   LMP  (LMP Unknown)   SpO2 98%   BMI 49.42 kg/m     Depression Screen    07/28/2022    9:16 AM 06/23/2022   10:22 AM 05/19/2022    9:50 AM 04/14/2022    8:56 AM  PHQ 2/9 Scores  PHQ - 2 Score 0 0 0 0  PHQ- 9 Score 0 1 0 0   No results found for any visits on 07/28/22.   Physical Exam Physical Exam   NECK: No palpable lymphadenopathy or thyroid abnormalities. CHEST: Lungs clear, no crackles, no wheezing. CARDIOVASCULAR: Heart sounds regular, no abnormal heart sounds or murmurs. ABDOMEN: Abdomen gurgling. EXTREMITIES: No pitting swelling in legs.         Assessment & Plan      Problem List Items Addressed This Visit     CKD (chronic kidney disease) stage 3, GFR 30-59 ml/min (HCC)    Recent improvement in creatinine from 1.5 to 1.15 in June 24 -Chronic -Stable -Monitor kidney function regularly.      Class 3 severe obesity due to excess calories with body mass index (BMI) of 45.0 to 49.9 in adult [facility])    Chronic Encouraged physical activity with seated exercises as tolerable given history of arthritis and difficulty with mobility Recommended monitoring dietary intake as stated above for prediabetes       Essential hypertension     Well controlled on Benicar 40mg  daily, Hydralazine 10mg  twice daily, and Lasix 20mg  daily. Recent change in antihypertensive medication led to transient increase in blood pressure, now resolved. -Chronic -Blood pressure at goalx -Continue current regimen. -Check blood pressure regularly.      Functional urinary incontinence     Complete loss of bladder control, managed with diapers. -No further intervention at this time as patient is comfortable with current management.      Gastroesophageal reflux disease  without esophagitis    Chronic, asymptomatic at this time Stable on Protonix 40 mg daily Continue current regimen       HLD (hyperlipidemia)    Chronic Reviewed lipid panel from June 2024 and  all cholesterol elements were within goal ranges Continue lifestyle management with low-fat diet       RESOLVED: Major depression, single episode, in complete remission (HCC)   Obstructive sleep apnea syndrome    Chronic Stable Continue nightly CPAP Chronic  Stable with CPAP use  Patient advised to continue CPAP nightly  The patient has had significant benefit from the use of CPAP machine with considerable improvements in quality of life, work production and decrease medical complaints.  The patient will need continued maintenance and care CPAP supplies (including upgrades as deemed appropriate) in order to continue treatment for sleep apnea as this is medically necessary.         Osteoarthritis    Present in multiple joints including shoulders, ankles, and cervical spine. Pain managed with Naproxen 500mg  as needed. -Continue Naproxen as needed given normal creatinine -Consider orthopedic consultation for further management options. -Patient is followed at [facility] clinic orthopedics      Prediabetes    Chronic Last hemoglobin A1c is still in prediabetes range Counseled patient on monitoring carbohydrates rice and sugary beverages in her diet to keep this level under diabetes Review EMR for previous hemoglobin A1c measurements the highest was 6.0 No additional medications recommended at this time       Other Visit Diagnoses     Establishing care with new doctor, encounter for    -  Primary           Return in about 6 months (around 01/28/2023) for CHRONIC F/U.      Ronnald Ramp, MD  [facility] [phone] (phone) [phone] (fax)  [facility]

## 2022-07-28 NOTE — Assessment & Plan Note (Signed)
Chronic, asymptomatic at this time Stable on Protonix 40 mg daily Continue current regimen

## 2022-07-28 NOTE — Assessment & Plan Note (Signed)
Welcomed patient to [facility]  Reviewed patient's medical history, medications, surgical and social history Discussed roles and expectations for primary care physician-patient relationship Recommended patient schedule annual preventative examinations   

## 2022-07-28 NOTE — Assessment & Plan Note (Signed)
Chronic Last hemoglobin A1c is still in prediabetes range Counseled patient on monitoring carbohydrates rice and sugary beverages in her diet to keep this level under diabetes Review EMR for previous hemoglobin A1c measurements the highest was 6.0 No additional medications recommended at this time

## 2022-07-28 NOTE — Assessment & Plan Note (Signed)
Chronic Reviewed lipid panel from June 2024 and all cholesterol elements were within goal ranges Continue lifestyle management with low-fat diet

## 2022-07-28 NOTE — Assessment & Plan Note (Signed)
Chronic Stable Continue nightly CPAP Chronic  Stable with CPAP use  Patient advised to continue CPAP nightly  The patient has had significant benefit from the use of CPAP machine with considerable improvements in quality of life, work production and decrease medical complaints.  The patient will need continued maintenance and care CPAP supplies (including upgrades as deemed appropriate) in order to continue treatment for sleep apnea as this is medically necessary.

## 2022-08-28 ENCOUNTER — Other Ambulatory Visit: Payer: Self-pay | Admitting: Internal Medicine

## 2022-08-29 NOTE — Telephone Encounter (Signed)
Requested Prescriptions  Pending Prescriptions Disp Refills   furosemide (LASIX) 20 MG tablet [Pharmacy Med Name: FUROSEMIDE TABS 20MG ] 90 tablet 1    Sig: TAKE 1 TABLET DAILY     Cardiovascular:  Diuretics - Loop Failed - 08/28/2022  3:18 AM      Failed - Cr in normal range and within 180 days    Creat  Date Value Ref Range Status  06/23/2022 1.15 (H) 0.60 - 0.95 mg/dL Final         Failed - Mg Level in normal range and within 180 days    No results found for: "MG"       Passed - K in normal range and within 180 days    Potassium  Date Value Ref Range Status  06/23/2022 4.7 3.5 - 5.3 mmol/L Final         Passed - Ca in normal range and within 180 days    Calcium  Date Value Ref Range Status  06/23/2022 9.9 8.6 - 10.4 mg/dL Final         Passed - Na in normal range and within 180 days    Sodium  Date Value Ref Range Status  06/23/2022 141 135 - 146 mmol/L Final         Passed - Cl in normal range and within 180 days    Chloride  Date Value Ref Range Status  06/23/2022 104 98 - 110 mmol/L Final         Passed - Last BP in normal range    BP Readings from Last 1 Encounters:  07/28/22 126/79         Passed - Valid encounter within last 6 months    Recent Outpatient Visits           1 month ago Essential hypertension   [facility] [place], Tawanna Cooler, MD   2 months ago Prediabetes   [facility] [place], Salvadore Oxford, NP   4 months ago Rectal mass   [facility] [place], Salvadore Oxford, NP   5 months ago Essential hypertension   [facility] , Salvadore Oxford, NP   8 months ago Pure hypercholesterolemia   [place] [facility] [place], Salvadore Oxford, NP       Future Appointments             In 5 months Simmons-Robinson, Tawanna Cooler, MD [facility]

## 2022-09-04 ENCOUNTER — Encounter: Payer: Self-pay | Admitting: Nurse Practitioner

## 2022-09-04 ENCOUNTER — Ambulatory Visit (INDEPENDENT_AMBULATORY_CARE_PROVIDER_SITE_OTHER): Payer: Medicare Other | Admitting: Nurse Practitioner

## 2022-09-04 ENCOUNTER — Ambulatory Visit: Payer: Self-pay | Admitting: *Deleted

## 2022-09-04 ENCOUNTER — Ambulatory Visit: Payer: Medicare Other | Admitting: Family Medicine

## 2022-09-04 VITALS — BP 138/82 | HR 61 | Temp 97.5°F | Wt 288.2 lb

## 2022-09-04 DIAGNOSIS — R300 Dysuria: Secondary | ICD-10-CM

## 2022-09-04 LAB — POCT URINALYSIS DIPSTICK
Bilirubin, UA: NEGATIVE
Glucose, UA: NEGATIVE
Ketones, UA: NEGATIVE
Nitrite, UA: NEGATIVE
Odor: NORMAL
Protein, UA: NEGATIVE
Spec Grav, UA: 1.02 (ref 1.010–1.025)
Urobilinogen, UA: 0.2 E.U./dL
pH, UA: 6 (ref 5.0–8.0)

## 2022-09-04 MED ORDER — SULFAMETHOXAZOLE-TRIMETHOPRIM 800-160 MG PO TABS
1.0000 | ORAL_TABLET | Freq: Two times a day (BID) | ORAL | 0 refills | Status: AC
Start: 2022-09-04 — End: 2022-09-09

## 2022-09-04 NOTE — Progress Notes (Signed)
BP 138/82   Pulse 61   Temp (!) 97.5 F (36.4 C)   Wt 288 lb 3.2 oz (130.7 kg)   LMP  (LMP Unknown)   SpO2 96%   BMI 47.96 kg/m    Subjective:    Patient ID: Lindsey Jacobs, female    DOB: [birth year], 87 y.o.   MRN: [id number]  HPI: Lindsey Jacobs is a 87 y.o. female  Chief Complaint  Patient presents with   Urinary Tract Infection    Burning with urination   URINARY SYMPTOMS Symptoms started about a week ago Dysuria: yes Urinary frequency: yes Urgency: yes Small volume voids: yes Symptom severity: moderate Urinary incontinence: yes Foul odor: yes Hematuria: no Abdominal pain: no Back pain: no Suprapubic pain/pressure: no Flank pain: no Fever:  no Vomiting: no Relief with cranberry juice: no Relief with pyridium:  no Status: better/worse/stable Previous urinary tract infection: yes Recurrent urinary tract infection: no History of sexually transmitted disease: no Treatments attempted: cranberry and increasing fluids   Urine dip positive for blood and leukocytes, will send urine for culture Start bactrim, has been on it in the past and tolerated it well.   Relevant past medical, surgical, family and social history reviewed and updated as indicated. Interim medical history since our last visit reviewed. Allergies and medications reviewed and updated.  Review of Systems  Constitutional: Negative for fever or weight change.  Respiratory: Negative for cough and shortness of breath.   Cardiovascular: Negative for chest pain or palpitations.  Gastrointestinal: Negative for abdominal pain, no bowel changes.  GU: positive for urinary frequency, urgency and dysuria Musculoskeletal: Negative for gait problem or joint swelling.  Skin: Negative for rash.  Neurological: Negative for dizziness or headache.  No other specific complaints in a complete review of systems (except as listed in HPI above).      Objective:    BP 138/82   Pulse 61   Temp (!) 97.5 F (36.4 C)    Wt 288 lb 3.2 oz (130.7 kg)   LMP  (LMP Unknown)   SpO2 96%   BMI 47.96 kg/m   Wt Readings from Last 3 Encounters:  09/04/22 288 lb 3.2 oz (130.7 kg)  07/28/22 297 lb (134.7 kg)  07/05/22 297 lb 6.4 oz (134.9 kg)    Physical Exam  Constitutional: Patient appears well-developed and well-nourished. Obese  No distress.  HEENT: head atraumatic, normocephalic, pupils equal and reactive to light, neck supple Cardiovascular: Normal rate, regular rhythm and normal heart sounds.  No murmur heard. No BLE edema. Pulmonary/Chest: Effort normal and breath sounds normal. No respiratory distress. Abdominal: Soft.  There is no tenderness. No CVA tenderness Psychiatric: Patient has a normal mood and affect. behavior is normal. Judgment and thought content normal.  Results for orders placed or performed in visit on 09/04/22  POCT urinalysis dipstick  Result Value Ref Range   Color, UA yellow    Clarity, UA cloudy    Glucose, UA Negative Negative   Bilirubin, UA neg    Ketones, UA neg    Spec Grav, UA 1.020 1.010 - 1.025   Blood, UA trace    pH, UA 6.0 5.0 - 8.0   Protein, UA Negative Negative   Urobilinogen, UA 0.2 0.2 or 1.0 E.U./dL   Nitrite, UA neg    Leukocytes, UA Large (3+) (A) Negative   Appearance cloudy    Odor normal       Assessment & Plan:   Problem List Items Addressed This Visit  None Visit Diagnoses     Burning with urination    -  Primary   urine sent for culture, start bactrim, push fluids can take over the counter pyridium if needed   Relevant Medications   sulfamethoxazole-trimethoprim (BACTRIM DS) 800-160 MG tablet   Other Relevant Orders   POCT urinalysis dipstick (Completed)   Urine Culture        Follow up plan: Return if symptoms worsen or fail to improve.

## 2022-09-04 NOTE — Telephone Encounter (Signed)
  Chief Complaint: urinary pain, cloudy urine Symptoms: see above Frequency: 1 week Pertinent Negatives: Patient denies fever Disposition: [] ED /[] Urgent Care (no appt availability in office) / [x] Appointment(In office/virtual)/ []  [facility]/ [] Home Care/ [] Refused Recommended Disposition /[] [facility]/ []  Follow-up with PCP Additional Notes: No appointment today- discussed options UC- but patient wants appointment

## 2022-09-04 NOTE — Telephone Encounter (Signed)
Summary: Vaginal [place] Advice   Pt is calling to report urine is cloudy with burning, with frequency urination. No available appt today Please advise         Reason for Disposition  Age > 50 years  Answer Assessment - Initial Assessment Questions 1. SEVERITY: "How bad is the pain?"  (e.g., Scale 1-10; mild, moderate, or severe)   - MILD (1-3): complains slightly about urination hurting   - MODERATE (4-7): interferes with normal activities     - SEVERE (8-10): excruciating, unwilling or unable to urinate because of the pain      moderate 2. FREQUENCY: "How many times have you had painful urination today?"      *No Answer* 3. PATTERN: "Is pain present every time you urinate or just sometimes?"      Every time- discomfort 4. ONSET: "When did the painful urination start?"      Started 1 week ago  6. PAST UTI: "Have you had a urine infection before?" If Yes, ask: "When was the last time?" and "What happened that time?"      yes  8. OTHER SYMPTOMS: "Do you have any other symptoms?" (e.g., blood in urine, flank pain, genital sores, urgency, vaginal discharge)     cloudy  Protocols used: Urination Pain - Female-A-AH

## 2022-09-06 LAB — URINE CULTURE
MICRO NUMBER:: 15350651
SPECIMEN QUALITY:: ADEQUATE

## 2022-09-11 ENCOUNTER — Telehealth: Payer: Self-pay | Admitting: Family Medicine

## 2022-09-11 NOTE — Telephone Encounter (Signed)
Patient called stated she was suppose to be coming back in for urine test. Please f/u with patient to let her know when she can come in for labs.

## 2022-09-11 NOTE — Telephone Encounter (Signed)
Patient notified she did not need to come back unless she continue to have issues

## 2022-09-15 ENCOUNTER — Other Ambulatory Visit: Payer: Self-pay | Admitting: Internal Medicine

## 2022-09-15 DIAGNOSIS — F324 Major depressive disorder, single episode, in partial remission: Secondary | ICD-10-CM

## 2022-09-15 NOTE — Telephone Encounter (Signed)
Requested medications are due for refill today.  yes  Requested medications are on the active medications list.  yes  Last refill. 03/21/2022 #90 1 rf  Future visit scheduled.   yes  Notes to clinic.  Unclear if pt is a [facility] or a [facility]. PCP is a [facility] provider.    Requested Prescriptions  Pending Prescriptions Disp Refills   DULoxetine (CYMBALTA) 60 MG capsule [Pharmacy Med Name: DULOXETINE HCL DR CAPS 60MG ] 90 capsule 3    Sig: TAKE 1 CAPSULE DAILY (NEED APPOINTMENT IN OCTOBER)     Psychiatry: Antidepressants - SNRI - duloxetine Failed - 09/15/2022  3:13 AM      Failed - Cr in normal range and within 360 days    Creat  Date Value Ref Range Status  06/23/2022 1.15 (H) 0.60 - 0.95 mg/dL Final         Passed - eGFR is 30 or above and within 360 days    GFR, Est African American  Date Value Ref Range Status  06/25/2019 63 > OR = 60 mL/min/1.57m2 Final   GFR, Est Non African American  Date Value Ref Range Status  06/25/2019 54 (L) > OR = 60 mL/min/1.71m2 Final   eGFR  Date Value Ref Range Status  06/23/2022 47 (L) > OR = 60 mL/min/1.56m2 Final         Passed - Completed PHQ-2 or PHQ-9 in the last 360 days      Passed - Last BP in normal range    BP Readings from Last 1 Encounters:  09/04/22 138/82         Passed - Valid encounter within last 6 months    Recent Outpatient Visits           1 week ago Burning with urination   [facility] Berniece Salines, FNP   1 month ago Essential hypertension   [facility] Ronnald Ramp, MD   2 months ago Prediabetes   [facility] [place], Salvadore Oxford, NP   5 months ago Rectal mass   [facility] [place], Salvadore Oxford, NP   6 months ago Essential hypertension   [facility] [place], Salvadore Oxford, NP       Future Appointments             In 4 months Simmons-Robinson, Tawanna Cooler, MD [facility]   In 5 months Zane Herald, Rudolpho Sevin, FNP [facility]

## 2022-09-20 ENCOUNTER — Other Ambulatory Visit: Payer: Self-pay | Admitting: Internal Medicine

## 2022-09-21 NOTE — Telephone Encounter (Signed)
Requested Prescriptions  Pending Prescriptions Disp Refills   hydrALAZINE (APRESOLINE) 10 MG tablet [Pharmacy Med Name: HYDRALAZINE TABS 10MG ] 180 tablet 1    Sig: TAKE 1 TABLET TWICE A DAY     Cardiovascular:  Vasodilators Failed - 09/20/2022  3:08 AM      Failed - ANA Screen, Ifa, Serum in normal range and within 360 days    Anti Nuclear Antibody (ANA)  Date Value Ref Range Status  06/25/2019 NEGATIVE NEGATIVE Final    Comment:    ANA IFA is a first line screen for detecting the presence of up to approximately 150 autoantibodies in various autoimmune diseases. A negative ANA IFA result suggests an ANA-associated autoimmune disease is not present at this time, but is not definitive. If there is high clinical suspicion for Sjogren's syndrome, testing for anti-SS-A/Ro antibody should be considered. Anti-Jo-1 antibody should be considered for clinically suspected inflammatory myopathies. . AC-0: Negative . International Consensus on ANA Patterns (SeverTies.uy) . For additional information, please refer to http://education.[facility].com/faq/FAQ177 (This link is being provided for informational/ educational purposes only.) .          Passed - HCT in normal range and within 360 days    HCT  Date Value Ref Range Status  06/23/2022 41.5 35.0 - 45.0 % Final         Passed - HGB in normal range and within 360 days    Hemoglobin  Date Value Ref Range Status  06/23/2022 13.7 11.7 - 15.5 g/dL Final         Passed - RBC in normal range and within 360 days    RBC  Date Value Ref Range Status  06/23/2022 4.88 3.80 - 5.10 Million/uL Final         Passed - WBC in normal range and within 360 days    WBC  Date Value Ref Range Status  06/23/2022 6.0 3.8 - 10.8 Thousand/uL Final         Passed - PLT in normal range and within 360 days    Platelets  Date Value Ref Range Status  06/23/2022 242 140 - 400 Thousand/uL Final         Passed - Last  BP in normal range    BP Readings from Last 1 Encounters:  09/04/22 138/82         Passed - Valid encounter within last 12 months    Recent Outpatient Visits           2 weeks ago Burning with urination   [facility] Berniece Salines, FNP   1 month ago Essential hypertension   [place] Simmons-Robinson, [place], MD   3 months ago Prediabetes   [place] [place], Salvadore Oxford, NP   5 months ago Rectal mass   [facility], Salvadore Oxford, NP   6 months ago Essential hypertension   [place] [facility] [place], Salvadore Oxford, NP       Future Appointments             In 4 months Simmons-Robinson, Tawanna Cooler, MD [facility], [facility]   In 5 months Zane Herald, Rudolpho Sevin, FNP [facility], [facility]

## 2022-09-25 ENCOUNTER — Other Ambulatory Visit: Payer: Self-pay | Admitting: Nurse Practitioner

## 2022-09-25 DIAGNOSIS — Z1231 Encounter for screening mammogram for malignant neoplasm of breast: Secondary | ICD-10-CM

## 2022-09-26 ENCOUNTER — Other Ambulatory Visit: Payer: Self-pay | Admitting: Internal Medicine

## 2022-09-27 NOTE — Telephone Encounter (Signed)
Requested medication (s) are due for refill today:yes  Requested medication (s) are on the active medication list: yes  Last refill:  06/29/22 #90  Future visit scheduled: yes  Notes to clinic:  Cr out of normal range   Requested Prescriptions  Pending Prescriptions Disp Refills   olmesartan (BENICAR) 40 MG tablet [Pharmacy Med Name: OLMESARTAN MEDOXOMIL TABS 40MG ] 90 tablet 3    Sig: TAKE 1 TABLET DAILY     Cardiovascular:  Angiotensin Receptor Blockers Failed - 09/26/2022  3:14 AM      Failed - Cr in normal range and within 180 days    Creat  Date Value Ref Range Status  06/23/2022 1.15 (H) 0.60 - 0.95 mg/dL Final         Passed - K in normal range and within 180 days    Potassium  Date Value Ref Range Status  06/23/2022 4.7 3.5 - 5.3 mmol/L Final         Passed - Patient is not pregnant      Passed - Last BP in normal range    BP Readings from Last 1 Encounters:  09/04/22 138/82         Passed - Valid encounter within last 6 months    Recent Outpatient Visits           3 weeks ago Burning with urination   [facility] Berniece Salines, FNP   2 months ago Essential hypertension   [facility] Simmons-Robinson, [place], MD   3 months ago Prediabetes   [place] [facility] [place], Salvadore Oxford, NP   5 months ago Rectal mass   [facility] [place], Salvadore Oxford, NP   6 months ago Essential hypertension   [facility], Salvadore Oxford, NP       Future Appointments             In 4 months Simmons-Robinson, Tawanna Cooler, MD [facility], [facility]   In 5 months Zane Herald, Rudolpho Sevin, FNP [facility], [facility]

## 2022-10-19 ENCOUNTER — Ambulatory Visit
Admission: RE | Admit: 2022-10-19 | Discharge: 2022-10-19 | Disposition: A | Discharge status: Home / Self Care | Payer: Medicare Other | Source: Ambulatory Visit | Attending: Nurse Practitioner | Admitting: Nurse Practitioner

## 2022-10-19 DIAGNOSIS — Z1231 Encounter for screening mammogram for malignant neoplasm of breast: Secondary | ICD-10-CM | POA: Diagnosis present

## 2022-12-26 ENCOUNTER — Ambulatory Visit: Payer: Medicare Other | Admitting: Nurse Practitioner

## 2022-12-26 VITALS — BP 132/82 | HR 66 | Temp 97.9°F | Resp 18 | Ht 65.0 in | Wt 291.2 lb

## 2022-12-26 DIAGNOSIS — G8929 Other chronic pain: Secondary | ICD-10-CM

## 2022-12-26 DIAGNOSIS — H9193 Unspecified hearing loss, bilateral: Secondary | ICD-10-CM | POA: Diagnosis not present

## 2022-12-26 DIAGNOSIS — R262 Difficulty in walking, not elsewhere classified: Secondary | ICD-10-CM

## 2022-12-26 DIAGNOSIS — J309 Allergic rhinitis, unspecified: Secondary | ICD-10-CM | POA: Diagnosis not present

## 2022-12-26 DIAGNOSIS — Z630 Problems in relationship with spouse or partner: Secondary | ICD-10-CM | POA: Insufficient documentation

## 2022-12-26 DIAGNOSIS — M545 Low back pain, unspecified: Secondary | ICD-10-CM

## 2022-12-26 DIAGNOSIS — J0140 Acute pansinusitis, unspecified: Secondary | ICD-10-CM

## 2022-12-26 DIAGNOSIS — T7840XA Allergy, unspecified, initial encounter: Secondary | ICD-10-CM

## 2022-12-26 MED ORDER — PREDNISONE 10 MG (21) PO TBPK: ORAL_TABLET | ORAL | 0 refills | Status: DC

## 2022-12-26 MED ORDER — CETIRIZINE HCL 10 MG PO TABS
10.0000 mg | ORAL_TABLET | Freq: Every day | ORAL | 11 refills | Status: DC
Start: 2022-12-26 — End: 2023-01-02

## 2022-12-26 MED ORDER — FLUTICASONE PROPIONATE 50 MCG/ACT NA SUSP: 2.0000 | Freq: Every day | NASAL | 6 refills | Status: DC

## 2022-12-26 NOTE — Progress Notes (Signed)
BP 132/82 (BP Location: Right Arm, Patient Position: Sitting, Cuff Size: Large)   Pulse 66   Temp 97.9 F (36.6 C) (Oral)   Resp 18   Ht 5\' 5"  (1.651 m) Comment: per patient  Wt 291 lb 3.2 oz (132.1 kg)   LMP  (LMP Unknown)   SpO2 95%   BMI 48.46 kg/m    Subjective:    Patient ID: Lindsey Jacobs, female    DOB: Jan 01, 1936, 87 y.o.   MRN: [id number]  HPI: Lindsey Jacobs is a 87 y.o. female  Chief Complaint  Patient presents with   Allergies   Cough   sneezing   Discussed the use of AI scribe software for clinical note transcription with the patient, who gave verbal consent to proceed.  History of Present Illness   The patient,  presents with symptoms of a respiratory infection that started about a week ago. She woke up one morning unable to breathe through her nose, with her whole head feeling "stuffed up." She began sneezing frequently and had a runny nose. The following day, she experienced facial tenderness. She denies any ear pain or pressure, fever, productive cough, or shortness of breath. She has been taking over-the-counter Nyquil and Dayquil for symptom relief.  In addition to the respiratory symptoms, the patient has been experiencing difficulty walking for many years. She was told by a doctor in the 1980s that she would eventually be unable to walk due to a condition in her lower back. The patient reports that her ability to walk has been gradually declining since then, and she has been using a walker since 2002. She stopped being able to walk to the kitchen to prepare food in 2019.    Relevant past medical, surgical, family and social history reviewed and updated as indicated. Interim medical history since our last visit reviewed. Allergies and medications reviewed and updated.  Review of Systems  Ten systems reviewed and is negative except as mentioned in HPI       Objective:    BP 132/82 (BP Location: Right Arm, Patient Position: Sitting, Cuff Size: Large)    Pulse 66   Temp 97.9 F (36.6 C) (Oral)   Resp 18   Ht 5\' 5"  (1.651 m) Comment: per patient  Wt 291 lb 3.2 oz (132.1 kg)   LMP  (LMP Unknown)   SpO2 95%   BMI 48.46 kg/m   Wt Readings from Last 3 Encounters:  12/26/22 291 lb 3.2 oz (132.1 kg)  09/04/22 288 lb 3.2 oz (130.7 kg)  07/28/22 297 lb (134.7 kg)    Physical Exam  Constitutional: Patient appears well-developed and well-nourished. Obese  No distress.  HEENT: head atraumatic, normocephalic, pupils equal and reactive to light, ears Tms clear, neck supple, throat within normal limits Cardiovascular: Normal rate, regular rhythm and normal heart sounds.  No murmur heard. No BLE edema. Pulmonary/Chest: Effort normal and breath sounds normal. No respiratory distress. Abdominal: Soft.  There is no tenderness. Psychiatric: Patient has a normal mood and affect. behavior is normal. Judgment and thought content normal.      Assessment & Plan:   Problem List Items Addressed This Visit   None Visit Diagnoses     Allergy, initial encounter    -  Primary   Relevant Medications   cetirizine (ZYRTEC) 10 MG tablet   fluticasone (FLONASE) 50 MCG/ACT nasal spray   predniSONE (STERAPRED UNI-PAK 21 TAB) 10 MG (21) TBPK tablet   Acute non-recurrent pansinusitis  Relevant Medications   cetirizine (ZYRTEC) 10 MG tablet   fluticasone (FLONASE) 50 MCG/ACT nasal spray   predniSONE (STERAPRED UNI-PAK 21 TAB) 10 MG (21) TBPK tablet   Bilateral hearing loss, unspecified hearing loss type       Relevant Orders   Ambulatory referral to ENT   Unable to walk       Relevant Orders   Ambulatory referral to Orthopedic Surgery   MR Lumbar Spine Wo Contrast   Chronic low back pain without sciatica, unspecified back pain laterality       Relevant Medications   predniSONE (STERAPRED UNI-PAK 21 TAB) 10 MG (21) TBPK tablet   Other Relevant Orders   Ambulatory referral to Orthopedic Surgery   MR Lumbar Spine Wo Contrast       Assessment and  Plan    Upper Respiratory Symptoms Symptoms of nasal congestion, sneezing, rhinorrhea, and facial tenderness suggestive of allergic rhinitis. No fever, cough, or shortness of breath. -Start Flonase nasal spray, available over the counter or as a prescription. -Start Zyrtec for allergy symptoms, continue until symptoms resolve. -steroid taper  Hearing Loss Reports difficulty hearing, particularly when watching television or on the phone. -Referral to Ear, Nose, and Throat specialist for hearing evaluation and potential hearing aid fitting.  Difficulty Walking Reports progressive difficulty walking over several years, with a history of back pain. No recent falls or acute injuries. -Order MRI of the lower back to evaluate for potential causes of difficulty walking. -Referral to Orthopedics for further evaluation and management.        Chronic back pain Has had back pain for years and was told many  years ago she would be in a wheelchair.  She does not know why and would like to be evaluated.  Mri and referral placed  Follow up plan: Return if symptoms worsen or fail to improve.

## 2022-12-28 ENCOUNTER — Other Ambulatory Visit: Payer: Self-pay

## 2022-12-28 DIAGNOSIS — J0140 Acute pansinusitis, unspecified: Secondary | ICD-10-CM

## 2022-12-28 DIAGNOSIS — T7840XA Allergy, unspecified, initial encounter: Secondary | ICD-10-CM

## 2023-01-01 ENCOUNTER — Ambulatory Visit
Admission: RE | Admit: 2023-01-01 | Discharge: 2023-01-01 | Disposition: A | Discharge status: Home / Self Care | Payer: Medicare Other | Source: Ambulatory Visit | Attending: Nurse Practitioner | Admitting: Nurse Practitioner

## 2023-01-01 DIAGNOSIS — G8929 Other chronic pain: Secondary | ICD-10-CM | POA: Insufficient documentation

## 2023-01-01 DIAGNOSIS — R262 Difficulty in walking, not elsewhere classified: Secondary | ICD-10-CM | POA: Diagnosis present

## 2023-01-01 DIAGNOSIS — M545 Low back pain, unspecified: Secondary | ICD-10-CM | POA: Diagnosis present

## 2023-01-02 ENCOUNTER — Other Ambulatory Visit: Payer: Self-pay

## 2023-01-02 DIAGNOSIS — T7840XA Allergy, unspecified, initial encounter: Secondary | ICD-10-CM

## 2023-01-02 DIAGNOSIS — J0140 Acute pansinusitis, unspecified: Secondary | ICD-10-CM

## 2023-01-02 MED ORDER — CETIRIZINE HCL 10 MG PO TABS: 10.0000 mg | ORAL_TABLET | Freq: Every day | ORAL | 11 refills | Status: DC

## 2023-01-02 MED ORDER — FLUTICASONE PROPIONATE 50 MCG/ACT NA SUSP: 2.0000 | Freq: Every day | NASAL | 6 refills | Status: DC

## 2023-01-16 ENCOUNTER — Other Ambulatory Visit: Payer: Self-pay | Admitting: Nurse Practitioner

## 2023-01-16 DIAGNOSIS — R262 Difficulty in walking, not elsewhere classified: Secondary | ICD-10-CM

## 2023-01-16 DIAGNOSIS — M545 Low back pain, unspecified: Secondary | ICD-10-CM

## 2023-01-16 DIAGNOSIS — M48061 Spinal stenosis, lumbar region without neurogenic claudication: Secondary | ICD-10-CM

## 2023-01-18 ENCOUNTER — Encounter: Payer: Self-pay | Admitting: Nurse Practitioner

## 2023-01-29 ENCOUNTER — Ambulatory Visit: Payer: Medicare Other | Admitting: Family Medicine

## 2023-02-15 DIAGNOSIS — M542 Cervicalgia: Secondary | ICD-10-CM | POA: Insufficient documentation

## 2023-02-15 DIAGNOSIS — M19019 Primary osteoarthritis, unspecified shoulder: Secondary | ICD-10-CM | POA: Insufficient documentation

## 2023-02-21 ENCOUNTER — Other Ambulatory Visit: Payer: Self-pay | Admitting: Internal Medicine

## 2023-02-21 DIAGNOSIS — F324 Major depressive disorder, single episode, in partial remission: Secondary | ICD-10-CM

## 2023-02-22 NOTE — Telephone Encounter (Signed)
 Requested medication (s) are due for refill today: yes  Requested medication (s) are on the active medication list: yes  Last refill:  09/15/22  Future visit scheduled: yes  Notes to clinic:  routing for review of PCP. Patient has OV's with [facility], and [place].     Requested Prescriptions  Pending Prescriptions Disp Refills   DULoxetine  (CYMBALTA ) 60 MG capsule [Pharmacy Med Name: DULOXETINE  HCL DR CAPS 60MG ] 90 capsule 3    Sig: TAKE 1 CAPSULE DAILY (NEED APPOINTMENT IN OCTOBER)     Psychiatry: Antidepressants - SNRI - duloxetine  Failed - 02/22/2023  3:32 PM      Failed - Cr in normal range and within 360 days    Creat  Date Value Ref Range Status  06/23/2022 1.15 (H) 0.60 - 0.95 mg/dL Final         Passed - eGFR is 30 or above and within 360 days    GFR, Est African American  Date Value Ref Range Status  06/25/2019 63 > OR = 60 mL/min/1.18m2 Final   GFR, Est Non African American  Date Value Ref Range Status  06/25/2019 54 (L) > OR = 60 mL/min/1.64m2 Final   eGFR  Date Value Ref Range Status  06/23/2022 47 (L) > OR = 60 mL/min/1.2m2 Final         Passed - Completed PHQ-2 or PHQ-9 in the last 360 days      Passed - Last BP in normal range    BP Readings from Last 1 Encounters:  12/26/22 132/82         Passed - Valid encounter within last 6 months    Recent Outpatient Visits           1 month ago Allergy, initial encounter   [facility] Gareth Mliss FALCON, FNP   5 months ago Burning with urination   [facility] Gareth Mliss FALCON, FNP   6 months ago Essential hypertension   [place] Sharma Coyer, MD   8 months ago Prediabetes   [facility] [place], Angeline ORN, NP   10 months ago Rectal mass   [facility] [place], Angeline ORN, NP       Future Appointments             Tomorrow Gareth, Mliss FALCON, FNP [facility]   In 2 weeks Gareth, Mliss FALCON, FNP [facility], [facility]

## 2023-02-23 ENCOUNTER — Encounter: Payer: Self-pay | Admitting: Nurse Practitioner

## 2023-02-23 ENCOUNTER — Ambulatory Visit (INDEPENDENT_AMBULATORY_CARE_PROVIDER_SITE_OTHER): Payer: Medicare Other | Admitting: Nurse Practitioner

## 2023-02-23 ENCOUNTER — Other Ambulatory Visit: Payer: Self-pay | Admitting: Family Medicine

## 2023-02-23 VITALS — BP 140/86 | HR 64 | Temp 98.3°F | Resp 18 | Ht 65.0 in | Wt 298.9 lb

## 2023-02-23 DIAGNOSIS — R109 Unspecified abdominal pain: Secondary | ICD-10-CM

## 2023-02-23 NOTE — Telephone Encounter (Signed)
 Requested medication (s) are due for refill today - yes  Requested medication (s) are on the active medication list -yes  Future visit scheduled -yes  Last refill: 08/29/22 #90 1RF  Notes to clinic: patient in office today- labs ordered- ok to RF?   Requested Prescriptions  Pending Prescriptions Disp Refills   furosemide  (LASIX ) 20 MG tablet [Pharmacy Med Name: FUROSEMIDE  TABS 20MG ] 90 tablet 3    Sig: TAKE 1 TABLET DAILY     Cardiovascular:  Diuretics - Loop Failed - 02/23/2023  3:34 PM      Failed - K in normal range and within 180 days    Potassium  Date Value Ref Range Status  06/23/2022 4.7 3.5 - 5.3 mmol/L Final         Failed - Ca in normal range and within 180 days    Calcium  Date Value Ref Range Status  06/23/2022 9.9 8.6 - 10.4 mg/dL Final         Failed - Na in normal range and within 180 days    Sodium  Date Value Ref Range Status  06/23/2022 141 135 - 146 mmol/L Final         Failed - Cr in normal range and within 180 days    Creat  Date Value Ref Range Status  06/23/2022 1.15 (H) 0.60 - 0.95 mg/dL Final         Failed - Cl in normal range and within 180 days    Chloride  Date Value Ref Range Status  06/23/2022 104 98 - 110 mmol/L Final         Failed - Mg Level in normal range and within 180 days    No results found for: MG       Failed - Last BP in normal range    BP Readings from Last 1 Encounters:  02/23/23 (!) 140/86         Passed - Valid encounter within last 6 months    Recent Outpatient Visits           1 month ago Allergy, initial encounter   [facility] Gareth Mliss FALCON, FNP   5 months ago Burning with urination   [facility] Gareth Mliss FALCON, FNP   7 months ago Essential hypertension   [facility] Sharma Coyer, MD   8 months ago Prediabetes   [place] [facility] [place], Angeline ORN, NP   10 months ago Rectal mass    [facility] [place], [place], NP       Future Appointments             In 2 weeks Gareth, Mliss FALCON, FNP [facility], [facility]               Requested Prescriptions  Pending Prescriptions Disp Refills   furosemide  (LASIX ) 20 MG tablet [Pharmacy Med Name: FUROSEMIDE  TABS 20MG ] 90 tablet 3    Sig: TAKE 1 TABLET DAILY     Cardiovascular:  Diuretics - Loop Failed - 02/23/2023  3:34 PM      Failed - K in normal range and within 180 days    Potassium  Date Value Ref Range Status  06/23/2022 4.7 3.5 - 5.3 mmol/L Final         Failed - Ca in normal range and within 180 days    Calcium  Date Value Ref Range  Status  06/23/2022 9.9 8.6 - 10.4 mg/dL Final         Failed - Na in normal range and within 180 days    Sodium  Date Value Ref Range Status  06/23/2022 141 135 - 146 mmol/L Final         Failed - Cr in normal range and within 180 days    Creat  Date Value Ref Range Status  06/23/2022 1.15 (H) 0.60 - 0.95 mg/dL Final         Failed - Cl in normal range and within 180 days    Chloride  Date Value Ref Range Status  06/23/2022 104 98 - 110 mmol/L Final         Failed - Mg Level in normal range and within 180 days    No results found for: MG       Failed - Last BP in normal range    BP Readings from Last 1 Encounters:  02/23/23 (!) 140/86         Passed - Valid encounter within last 6 months    Recent Outpatient Visits           1 month ago Allergy, initial encounter   [facility] Gareth Mliss FALCON, FNP   5 months ago Burning with urination   [facility] Gareth Mliss FALCON, FNP   7 months ago Essential hypertension   [place] Sharma Coyer, MD   8 months ago Prediabetes   [facility] [place], Angeline ORN, NP   10 months ago Rectal mass   [facility] [place], Angeline ORN,  NP       Future Appointments             In 2 weeks Gareth, Mliss FALCON, FNP [facility], [facility]

## 2023-02-23 NOTE — Progress Notes (Addendum)
 BP (!) 140/86   Pulse 64   Temp 98.3 F (36.8 C)   Resp 18   Ht 5' 5 (1.651 m)   Wt 298 lb 14.4 oz (135.6 kg)   LMP  (LMP Unknown)   SpO2 98%   BMI 49.74 kg/m    Subjective:    Patient ID: Lindsey Jacobs, female    DOB: [birth year], 88 y.o.   MRN: [id number]  HPI: Lindsey Jacobs is a 88 y.o. female  Chief Complaint  Patient presents with   Flank Pain    Left middle side onset 6 months worsening and also worse at night    Discussed the use of AI scribe software for clinical note transcription with the patient, who gave verbal consent to proceed.  History of Present Illness   The patient, with a history of chronic back pain, presents with worsening pain in the mid-back region left flank. The pain, which started about six months ago, has been gradually increasing in intensity over the past two months. The pain is described as a 'twinge' that is most noticeable when lying down and is exacerbated by turning onto the affected side. The patient denies any associated movement with the pain.  The patient also reports shoulder pain that extends from the shoulder to the bra line and radiates down the arms. This pain has been ongoing since 2002 and has recently worsened, causing numbness in the hands. The patient is scheduled to receive shots in both shoulders for pain management from orthopedic.  In addition to the musculoskeletal complaints, the patient reports recent changes in bowel habits, specifically an inability to make it to the bathroom in time, resulting in occasional incontinence. The patient denies diarrhea and describes the stools as normal.  The patient also mentions a history of lesions on both kidneys, identified on a previous CT scan. The lesions were further characterized as cysts on an MRI, and no further follow-up was recommended at the time. However, the patient expresses concern about these findings and requests a repeat CT scan.       12/26/2022    8:31 AM 09/04/2022    10:55 AM 07/28/2022    9:16 AM  Depression screen PHQ 2/9  Decreased Interest 0 0 0  Down, Depressed, Hopeless 0 0 0  PHQ - 2 Score 0 0 0  Altered sleeping 0 0 0  Tired, decreased energy 0 0 0  Change in appetite 0 0 0  Feeling bad or failure about yourself  0 0 0  Trouble concentrating 2 0 0  Moving slowly or fidgety/restless 0 0 0  Suicidal thoughts 0 0 0  PHQ-9 Score 2 0 0  Difficult doing work/chores Not difficult at all Not difficult at all Not difficult at all    Relevant past medical, surgical, family and social history reviewed and updated as indicated. Interim medical history since our last visit reviewed. Allergies and medications reviewed and updated.  Review of Systems  Ten systems reviewed and is negative except as mentioned in HPI      Objective:    BP (!) 140/86   Pulse 64   Temp 98.3 F (36.8 C)   Resp 18   Ht 5' 5 (1.651 m)   Wt 298 lb 14.4 oz (135.6 kg)   LMP  (LMP Unknown)   SpO2 98%   BMI 49.74 kg/m    Wt Readings from Last 3 Encounters:  02/23/23 298 lb 14.4 oz (135.6 kg)  12/26/22 291 lb 3.2  oz (132.1 kg)  09/04/22 288 lb 3.2 oz (130.7 kg)    Physical Exam Vitals reviewed.  Constitutional:      Appearance: Normal appearance.  HENT:     Head: Normocephalic.  Cardiovascular:     Rate and Rhythm: Normal rate and regular rhythm.  Pulmonary:     Effort: Pulmonary effort is normal.     Breath sounds: Normal breath sounds.  Abdominal:     General: There is no distension.     Tenderness: There is no abdominal tenderness. There is no right CVA tenderness, left CVA tenderness or guarding.  Musculoskeletal:        General: Normal range of motion.  Skin:    General: Skin is warm and dry.  Neurological:     General: No focal deficit present.     Mental Status: She is alert and oriented to person, place, and time. Mental status is at baseline.  Psychiatric:        Mood and Affect: Mood normal.        Behavior: Behavior normal.        Thought  Content: Thought content normal.        Judgment: Judgment normal.           Assessment & Plan:   Problem List Items Addressed This Visit   None Visit Diagnoses       Left flank pain    -  Primary   Relevant Orders   CT ABDOMEN PELVIS W CONTRAST   CBC with Differential/Platelet   COMPLETE METABOLIC PANEL WITH GFR        Assessment and Plan    left Flank Pain Pain localized to the left flank, worsening over the past two months, particularly when lying down. No associated abdominal or urinary symptoms. Prior imaging showed cysts on both kidneys, but patient is concerned about the relationship between her pain and these findings. -Order CT scan of abdomen  -Obtain blood work to assess kidney function prior to contrast administration for CT scan.  Cervical Spine Stenosis Severe stenosis causing pain radiating down arms and numbness in hands. Patient has declined surgical intervention. -Continue conservative management as per orthopedic recommendations.  Shoulder Pain Pain extending from shoulders to bra line, also radiating down arms. Prior relief with corticosteroid injections. -Plan for corticosteroid injections in both shoulders next week.  Lower Back Pain Chronic lower back pain, patient has declined intervention and is managing with conservative measures. -No change in management at this time.  Bowel Incontinence New onset of occasional incontinence without diarrhea. -getting ct scan  General Health Maintenance / Followup Plans -Complete and submit form for [facility] housebound status or permanent need for regular aid and attendance. -Follow up after imaging and orthopedic interventions.        Follow up plan: Return if symptoms worsen or fail to improve.

## 2023-02-24 LAB — CBC WITH DIFFERENTIAL/PLATELET
Absolute Lymphocytes: 1428 {cells}/uL (ref 850–3900)
Absolute Monocytes: 372 {cells}/uL (ref 200–950)
Basophils Absolute: 42 {cells}/uL (ref 0–200)
Basophils Relative: 0.7 %
Eosinophils Absolute: 384 {cells}/uL (ref 15–500)
Eosinophils Relative: 6.4 %
HCT: 41 % (ref 35.0–45.0)
Hemoglobin: 13.4 g/dL (ref 11.7–15.5)
MCH: 28.3 pg (ref 27.0–33.0)
MCHC: 32.7 g/dL (ref 32.0–36.0)
MCV: 86.5 fL (ref 80.0–100.0)
MPV: 11.3 fL (ref 7.5–12.5)
Monocytes Relative: 6.2 %
Neutro Abs: 3774 {cells}/uL (ref 1500–7800)
Neutrophils Relative %: 62.9 %
Platelets: 232 10*3/uL (ref 140–400)
RBC: 4.74 10*6/uL (ref 3.80–5.10)
RDW: 14.3 % (ref 11.0–15.0)
Total Lymphocyte: 23.8 %
WBC: 6 10*3/uL (ref 3.8–10.8)

## 2023-02-24 LAB — COMPLETE METABOLIC PANEL WITH GFR
AG Ratio: 1.4 (calc) (ref 1.0–2.5)
ALT: 8 U/L (ref 6–29)
AST: 14 U/L (ref 10–35)
Albumin: 4 g/dL (ref 3.6–5.1)
Alkaline phosphatase (APISO): 86 U/L (ref 37–153)
BUN/Creatinine Ratio: 11 (calc) (ref 6–22)
BUN: 14 mg/dL (ref 7–25)
CO2: 30 mmol/L (ref 20–32)
Calcium: 10.2 mg/dL (ref 8.6–10.4)
Chloride: 105 mmol/L (ref 98–110)
Creat: 1.23 mg/dL — ABNORMAL HIGH (ref 0.60–0.95)
Globulin: 2.8 g/dL (ref 1.9–3.7)
Glucose, Bld: 94 mg/dL (ref 65–99)
Potassium: 4.8 mmol/L (ref 3.5–5.3)
Sodium: 143 mmol/L (ref 135–146)
Total Bilirubin: 0.7 mg/dL (ref 0.2–1.2)
Total Protein: 6.8 g/dL (ref 6.1–8.1)
eGFR: 43 mL/min/{1.73_m2} — ABNORMAL LOW (ref >=60)

## 2023-02-26 ENCOUNTER — Encounter: Payer: Self-pay | Admitting: Nurse Practitioner

## 2023-02-28 ENCOUNTER — Ambulatory Visit
Admission: RE | Admit: 2023-02-28 | Discharge: 2023-02-28 | Disposition: A | Discharge status: Home / Self Care | Payer: Medicare Other | Source: Ambulatory Visit | Attending: Nurse Practitioner | Admitting: Nurse Practitioner

## 2023-02-28 DIAGNOSIS — R109 Unspecified abdominal pain: Secondary | ICD-10-CM | POA: Diagnosis present

## 2023-02-28 MED ORDER — IOHEXOL 300 MG/ML  SOLN
85.0000 mL | Freq: Once | INTRAMUSCULAR | Status: AC | PRN
  Administered 2023-02-28: 85 mL via INTRAVENOUS

## 2023-02-28 NOTE — Telephone Encounter (Signed)
Per Asher Muir, the paperwork will be filled out at the daughters appt. Pt notified by the call center.

## 2023-02-28 NOTE — Telephone Encounter (Signed)
Copied from CRM [id number]. Topic: General - Other >> Feb 28, 2023  8:04 AM Tiffany B wrote: Reason for CRM: Patient was seen by Della Goo, NP on 2/7 and home health forms were brought to the appointment for Della Goo, NP to fill out. Caller would like to pick up forms today.

## 2023-03-09 ENCOUNTER — Encounter: Payer: Self-pay | Admitting: Cardiology

## 2023-03-12 ENCOUNTER — Encounter: Payer: Self-pay | Admitting: Nurse Practitioner

## 2023-03-12 ENCOUNTER — Ambulatory Visit (INDEPENDENT_AMBULATORY_CARE_PROVIDER_SITE_OTHER): Payer: Medicare Other | Admitting: Nurse Practitioner

## 2023-03-12 VITALS — BP 126/78 | HR 92 | Temp 98.4°F | Resp 18 | Ht 65.0 in | Wt 289.0 lb

## 2023-03-12 DIAGNOSIS — E7800 Pure hypercholesterolemia, unspecified: Secondary | ICD-10-CM

## 2023-03-12 DIAGNOSIS — R7303 Prediabetes: Secondary | ICD-10-CM

## 2023-03-12 DIAGNOSIS — G4733 Obstructive sleep apnea (adult) (pediatric): Secondary | ICD-10-CM

## 2023-03-12 DIAGNOSIS — H3530 Unspecified macular degeneration: Secondary | ICD-10-CM

## 2023-03-12 DIAGNOSIS — I700 Atherosclerosis of aorta: Secondary | ICD-10-CM | POA: Insufficient documentation

## 2023-03-12 DIAGNOSIS — F324 Major depressive disorder, single episode, in partial remission: Secondary | ICD-10-CM

## 2023-03-12 DIAGNOSIS — J0140 Acute pansinusitis, unspecified: Secondary | ICD-10-CM

## 2023-03-12 DIAGNOSIS — R3981 Functional urinary incontinence: Secondary | ICD-10-CM

## 2023-03-12 DIAGNOSIS — K219 Gastro-esophageal reflux disease without esophagitis: Secondary | ICD-10-CM | POA: Diagnosis not present

## 2023-03-12 DIAGNOSIS — Z6841 Body Mass Index (BMI) 40.0 and over, adult: Secondary | ICD-10-CM

## 2023-03-12 DIAGNOSIS — N1831 Chronic kidney disease, stage 3a: Secondary | ICD-10-CM | POA: Diagnosis not present

## 2023-03-12 DIAGNOSIS — I10 Essential (primary) hypertension: Secondary | ICD-10-CM | POA: Diagnosis not present

## 2023-03-12 DIAGNOSIS — T7840XA Allergy, unspecified, initial encounter: Secondary | ICD-10-CM

## 2023-03-12 DIAGNOSIS — F3342 Major depressive disorder, recurrent, in full remission: Secondary | ICD-10-CM

## 2023-03-12 DIAGNOSIS — M150 Primary generalized (osteo)arthritis: Secondary | ICD-10-CM

## 2023-03-12 MED ORDER — PANTOPRAZOLE SODIUM 40 MG PO TBEC: 40.0000 mg | DELAYED_RELEASE_TABLET | Freq: Every day | ORAL | 3 refills | Status: AC

## 2023-03-12 MED ORDER — CETIRIZINE HCL 10 MG PO TABS: 10.0000 mg | ORAL_TABLET | Freq: Every day | ORAL | 3 refills | Status: DC

## 2023-03-12 MED ORDER — DULOXETINE HCL 60 MG PO CPEP: ORAL_CAPSULE | ORAL | 3 refills | Status: DC

## 2023-03-12 NOTE — Progress Notes (Signed)
 BP 126/78   Pulse 92   Temp 98.4 F (36.9 C)   Resp 18   Ht 5\' 5"  (1.651 m)   Wt 289 lb (131.1 kg)   LMP  (LMP Unknown)   SpO2 91%   BMI 48.09 kg/m    Subjective:    Patient ID: Lindsey Jacobs, female    DOB: 09-07-1935, 88 y.o.   MRN: [id number]  HPI: Lindsey Jacobs is a 88 y.o. female  Chief Complaint  Patient presents with   Establish Care    Discussed the use of AI scribe software for clinical note transcription with the patient, who gave verbal consent to proceed.  History of Present Illness   The patient is an 88 year old woman with a complex medical history including hypertension, sleep apnea, GERD, osteoarthritis, chronic kidney disease, macular degeneration, obesity, and hyperlipidemia. She also has a history of worsening mid-back and left flank pain, which she describes as a twinge that is most noticeable when laying down and exacerbated by turning onto the affected side at a previous visit.  ct scan was performed, and showed no cause for her pain. She uses her CPAP machine every night for her sleep apnea.  The patient has been experiencing blurred vision more than usual in the past few days, with periods of clarity followed by periods of poor vision. She has a history of macular degeneration and is under the care of an ophthalmologist.  She also reports episodes of uncontrolled hypertension despite being on three different antihypertensive medications (olmesartan, hydralazine, and Lasix). She expresses dissatisfaction with her current regimen, stating that she started experiencing problems after her previous medication (triamterene hydrochlorothiazide) was changed. she reports that her heart rate and blood pressure have been all over the place. she is established with cardiology.       12/26/2022    8:31 AM 09/04/2022   10:55 AM 07/28/2022    9:16 AM  Depression screen PHQ 2/9  Decreased Interest 0 0 0  Down, Depressed, Hopeless 0 0 0  PHQ - 2 Score 0 0 0  Altered  sleeping 0 0 0  Tired, decreased energy 0 0 0  Change in appetite 0 0 0  Feeling bad or failure about yourself  0 0 0  Trouble concentrating 2 0 0  Moving slowly or fidgety/restless 0 0 0  Suicidal thoughts 0 0 0  PHQ-9 Score 2 0 0  Difficult doing work/chores Not difficult at all Not difficult at all Not difficult at all    Relevant past medical, surgical, family and social history reviewed and updated as indicated. Interim medical history since our last visit reviewed. Allergies and medications reviewed and updated.  Review of Systems  Constitutional: Negative for fever or weight change.  Respiratory: Negative for cough and shortness of breath.   Cardiovascular: Negative for chest pain or palpitations.  Gastrointestinal: Negative for abdominal pain, no bowel changes.  Musculoskeletal: positive for gait problem, wheelchair bound Skin: Negative for rash.  Neurological: Negative for dizziness or headache.  No other specific complaints in a complete review of systems (except as listed in HPI above).      Objective:    BP 126/78   Pulse 92   Temp 98.4 F (36.9 C)   Resp 18   Ht 5\' 5"  (1.651 m)   Wt 289 lb (131.1 kg)   LMP  (LMP Unknown)   SpO2 91%   BMI 48.09 kg/m   BP Readings from Last 3 Encounters:  03/12/23 126/78  02/23/23 (!) 140/86  12/26/22 132/82     Wt Readings from Last 3 Encounters:  03/12/23 289 lb (131.1 kg)  02/23/23 298 lb 14.4 oz (135.6 kg)  12/26/22 291 lb 3.2 oz (132.1 kg)    Physical Exam Vitals reviewed.  Constitutional:      Appearance: Normal appearance.  HENT:     Head: Normocephalic.  Cardiovascular:     Rate and Rhythm: Normal rate and regular rhythm.  Pulmonary:     Effort: Pulmonary effort is normal.     Breath sounds: Normal breath sounds.  Musculoskeletal:        General: Normal range of motion.  Skin:    General: Skin is warm and dry.  Neurological:     General: No focal deficit present.     Mental Status: She is alert and  oriented to person, place, and time. Mental status is at baseline.  Psychiatric:        Mood and Affect: Mood normal.        Behavior: Behavior normal.        Thought Content: Thought content normal.        Judgment: Judgment normal.     Results for orders placed or performed in visit on 02/23/23  CBC with Differential/Platelet   Collection Time: 02/23/23 10:43 AM  Result Value Ref Range   WBC 6.0 3.8 - 10.8 Thousand/uL   RBC 4.74 3.80 - 5.10 Million/uL   Hemoglobin 13.4 11.7 - 15.5 g/dL   HCT 82.9 56.2 - 13.0 %   MCV 86.5 80.0 - 100.0 fL   MCH 28.3 27.0 - 33.0 pg   MCHC 32.7 32.0 - 36.0 g/dL   RDW 86.5 78.4 - 69.6 %   Platelets 232 140 - 400 Thousand/uL   MPV 11.3 7.5 - 12.5 fL   Neutro Abs 3,774 1,500 - 7,800 cells/uL   Absolute Lymphocytes 1,428 850 - 3,900 cells/uL   Absolute Monocytes 372 200 - 950 cells/uL   Eosinophils Absolute 384 15 - 500 cells/uL   Basophils Absolute 42 0 - 200 cells/uL   Neutrophils Relative % 62.9 %   Total Lymphocyte 23.8 %   Monocytes Relative 6.2 %   Eosinophils Relative 6.4 %   Basophils Relative 0.7 %  COMPLETE METABOLIC PANEL WITH GFR   Collection Time: 02/23/23 10:43 AM  Result Value Ref Range   Glucose, Bld 94 65 - 99 mg/dL   BUN 14 7 - 25 mg/dL   Creat 2.95 (H) 2.84 - 0.95 mg/dL   eGFR 43 (L) > OR = 60 mL/min/1.26m2   BUN/Creatinine Ratio 11 6 - 22 (calc)   Sodium 143 135 - 146 mmol/L   Potassium 4.8 3.5 - 5.3 mmol/L   Chloride 105 98 - 110 mmol/L   CO2 30 20 - 32 mmol/L   Calcium 10.2 8.6 - 10.4 mg/dL   Total Protein 6.8 6.1 - 8.1 g/dL   Albumin 4.0 3.6 - 5.1 g/dL   Globulin 2.8 1.9 - 3.7 g/dL (calc)   AG Ratio 1.4 1.0 - 2.5 (calc)   Total Bilirubin 0.7 0.2 - 1.2 mg/dL   Alkaline phosphatase (APISO) 86 37 - 153 U/L   AST 14 10 - 35 U/L   ALT 8 6 - 29 U/L   Last CBC Lab Results  Component Value Date   WBC 6.0 02/23/2023   HGB 13.4 02/23/2023   HCT 41.0 02/23/2023   MCV 86.5 02/23/2023   MCH 28.3 02/23/2023   RDW 14.3  02/23/2023   PLT 232 02/23/2023   Last metabolic panel Lab Results  Component Value Date   GLUCOSE 94 02/23/2023   NA 143 02/23/2023   K 4.8 02/23/2023   CL 105 02/23/2023   CO2 30 02/23/2023   BUN 14 02/23/2023   CREATININE 1.23 (H) 02/23/2023   EGFR 43 (L) 02/23/2023   CALCIUM 10.2 02/23/2023   PROT 6.8 02/23/2023   BILITOT 0.7 02/23/2023   AST 14 02/23/2023   ALT 8 02/23/2023   Last lipids Lab Results  Component Value Date   CHOL 184 06/23/2022   HDL 66 06/23/2022   LDLCALC 99 06/23/2022   TRIG 98 06/23/2022   CHOLHDL 2.8 06/23/2022   Last hemoglobin A1c Lab Results  Component Value Date   HGBA1C 5.6 06/23/2022        Assessment & Plan:   Problem List Items Addressed This Visit       Cardiovascular and Mediastinum   Essential hypertension - Primary   Aortic atherosclerosis (HCC)     Respiratory   Obstructive sleep apnea syndrome   Relevant Orders   Ambulatory referral to Pulmonology     Digestive   Gastroesophageal reflux disease without esophagitis   Relevant Medications   pantoprazole (PROTONIX) 40 MG tablet     Musculoskeletal and Integument   Osteoarthritis     Genitourinary   CKD (chronic kidney disease) stage 3, GFR 30-59 ml/min (HCC)     Other   AMD (age-related macular degeneration), bilateral   Prediabetes   HLD (hyperlipidemia)   Class 3 severe obesity due to excess calories with body mass index (BMI) of 45.0 to 49.9 in adult [facility])   Functional urinary incontinence   Other Visit Diagnoses       Major depressive disorder with single episode, in partial remission (HCC)       Relevant Medications   DULoxetine (CYMBALTA) 60 MG capsule     Recurrent major depressive disorder, in full remission (HCC)       Relevant Medications   DULoxetine (CYMBALTA) 60 MG capsule     Allergy, initial encounter       Relevant Medications   cetirizine (ZYRTEC) 10 MG tablet     Acute non-recurrent pansinusitis       Relevant Medications    cetirizine (ZYRTEC) 10 MG tablet        Assessment and Plan    Hypertension Uncontrolled despite being on olmesartan 40mg  daily, hydralazine 10mg  twice daily, and Lasix 20mg  daily. Patient has concerns about the impact of these medications on her kidney function. -scheduled appointment with her cardiologist on Friday  Chronic Kidney Disease Stable with GFR in the 40s, consistent with stage 3B CKD.  Lumbar Spinal Stenosis Severe multifactorial stenosis noted on MRI from January 01, 2023. Patient has chosen not to pursue surgery. -Continue conservative management.  Gastroesophageal Reflux Disease (GERD) Well controlled on pantoprazole 40mg  daily. -Continue pantoprazole 40mg  daily.  Hyperlipidemia LDL 99 as of June 2024, slightly above target for a patient with hypertension. -Continue current management and lifestyle modifications.  Osteoarthritis Multiple joints affected, including ankles and shoulders. Patient receives periodic injections for pain management. -Continue current management. Patient may take naproxen 500mg  as needed for pain, with understanding of potential kidney risks.  Obstructive Sleep Apnea Patient uses CPAP machine nightly. -Continue nightly CPAP use.  Urinary Incontinence Managed with diapers. -Continue current management.  Macular Degeneration Patient reports recent episodes of blurred vision. -Advise patient to contact eye doctor if blurred vision episodes continue.  Obesity Current weight 289 pounds, BMI 48.09. -Encourage continued lifestyle modifications for weight management.  Diverticulosis Stable diffuse colonic diverticulosis noted on recent CT scan, no acute diverticulitis. -No specific intervention needed at this time.  General Health Maintenance / Followup Plans -Cardiology follow-up scheduled for Friday. Patient to message provider with update after appointment.        Follow up plan: Return in about 6 months (around 09/09/2023)  for follow up.

## 2023-03-12 NOTE — Telephone Encounter (Signed)
 Received secure chat from PCP- Della Goo, NP that patient was seen today in her office. She requested an appointment with MD. Scheduled for this Friday with Dr.Agbor-Etang at 11:00.   Patient took this appointment and was scheduled.   Will route as an FYI for upcoming appointment.  Thanks!

## 2023-03-14 ENCOUNTER — Encounter: Payer: Self-pay | Admitting: Sleep Medicine

## 2023-03-14 ENCOUNTER — Ambulatory Visit: Payer: Medicare Other | Admitting: Sleep Medicine

## 2023-03-14 VITALS — BP 120/80 | HR 59 | Temp 97.1°F | Ht 65.0 in | Wt 289.0 lb

## 2023-03-14 DIAGNOSIS — Z6841 Body Mass Index (BMI) 40.0 and over, adult: Secondary | ICD-10-CM

## 2023-03-14 DIAGNOSIS — I10 Essential (primary) hypertension: Secondary | ICD-10-CM

## 2023-03-14 DIAGNOSIS — G4733 Obstructive sleep apnea (adult) (pediatric): Secondary | ICD-10-CM | POA: Diagnosis not present

## 2023-03-14 NOTE — Patient Instructions (Signed)
 Patient Instructions  Continue to use CPAP every night, minimum of 7-8 hours a night.  Change equipment every 30 days or as directed by DME.  Wash your tubing with warm soap and water daily, hang to dry. Wash humidifier portion weekly. Use distilled water and change daily.  Be aware of reduced alertness and do not drive or operate heavy machinery if experiencing this or drowsiness.  Exercise encouraged, as tolerated. Encouraged proper weight management.  Important to get eight or more hours of sleep  Limiting the use of the computer and television before bedtime.  Decrease naps during the day, so night time sleep will become enhanced.  Limit caffeine, and sleep deprivation.  HTN, stroke, uncontrolled diabetes and heart failure are potential risk factors.  Risk of untreated sleep apnea including cardiac arrhthymias, stroke, DM, pulm HTN.

## 2023-03-14 NOTE — Progress Notes (Signed)
 Name:Lindsey Jacobs MRN: [id number] DOB: 1935-02-11   CHIEF COMPLAINT:  ESTABLISH CARE FOR OSA   HISTORY OF PRESENT ILLNESS:  [name] is a 88 y.o. w/ a h/o OSA, HTN, GERD and morbid obesity who presents to establish care for OSA. Reports that she was initially diagnosed with OSA several years ago and subsequently started on CPAP therapy. Reports using CPAP therapy every night, which is confirmed by compliance data. She is currently using the Airfit N20 nasal mask, which is comfortable. Denies air leaks or nasal congestion. Reports feeling more refreshed upon awakening with CPAP therapy. Denies snoring with CPAP therapy. Reports nocturnal awakenings due to unclear reasons, however does not have difficulty falling back to sleep. Reports a 40 lb weight loss over the last several years. Admits to occasional dry mouth. Denies morning headaches or dream enactment. Reports a family history of sleep apnea. Denies drowsy driving. Drinks 1 glass of iced tea weekly, denies alcohol, tobacco or illicit drug use.   Bedtime 11 pm Sleep onset 10 mins Rise time 9 am   EPWORTH SLEEP SCORE 3     No data to display         PAST MEDICAL HISTORY :   has a past medical history of Allergy, Colon polyps, Depression, GERD (gastroesophageal reflux disease), Glaucoma, Hyperlipidemia, Hypertension, Sleep apnea, and Urinary incontinence.  has a past surgical history that includes Cholecystectomy (1974); Abdominal hysterectomy (1984); Cesarean section (1974); Carpal tunnel release (Right, 1990); Cataract extraction, bilateral (Bilateral, 2004); Replacement total knee bilateral (Right, 2006); Varicose vein surgery (Left, 1965); Replacement total knee (Left, 2007); Eye surgery; Joint replacement; Esophagogastroduodenoscopy (egd) with propofol (N/A, 05/20/2019); Breast excisional biopsy (Right); and Colonoscopy with propofol (N/A, 05/11/2022). Prior to Admission medications   Medication Sig Start Date End Date  Taking? Authorizing Provider  Carboxymeth-Glyc-Polysorb PF (REFRESH DIGITAL PF) 0.5-1-0.5 % SOLN Apply to eye.    [provider]  cetirizine (ZYRTEC) 10 MG tablet Take 1 tablet (10 mg total) by mouth daily. 03/12/23   Berniece Salines, FNP  Cholecalciferol (VITAMIN D3 MAXIMUM STRENGTH PO) Take by mouth.    [provider]  cycloSPORINE (RESTASIS) 0.05 % ophthalmic emulsion SMARTSIG:In Eye(s) 05/23/21   [provider]  DULoxetine (CYMBALTA) 60 MG capsule TAKE 1 CAPSULE DAILY 03/12/23   Berniece Salines, FNP  fluticasone (FLONASE) 50 MCG/ACT nasal spray Place 2 sprays into both nostrils daily. 01/02/23   Berniece Salines, FNP  furosemide (LASIX) 20 MG tablet TAKE 1 TABLET DAILY 02/23/23   Berniece Salines, FNP  hydrALAZINE (APRESOLINE) 10 MG tablet TAKE 1 TABLET TWICE A DAY 09/21/22   Baity, Salvadore Oxford, NP  latanoprost (XALATAN) 0.005 % ophthalmic solution 1 drop at bedtime. 03/16/20   [provider]  Latanoprostene Bunod (VYZULTA) 0.024 % SOLN INSTILL 1 DROP IN BOTH EYES DAILY IN THE EVENING    [provider]  Magnesium Oxide 500 MG TABS Take by mouth. Patient is taking 2 tablet once daily.    [provider]  Multiple Vitamins-Minerals (ADVANCED EYE HEALTH PO) Take by mouth.    [provider]  naproxen (NAPROSYN) 500 MG tablet Take 1 tablet (500 mg total) by mouth daily as needed for moderate pain. Take with meal 06/23/22   Lorre Munroe, NP  olmesartan (BENICAR) 40 MG tablet TAKE 1 TABLET DAILY 09/27/22   Simmons-Robinson, Tawanna Cooler, MD  pantoprazole (PROTONIX) 40 MG tablet Take 1 tablet (40 mg total) by mouth daily. 03/12/23  Berniece Salines, FNP   Allergies  Allergen Reactions   Amoxapine And Related     Hives   Amoxicillin Hives   Diamox [Acetazolamide]    Keflex [Cephalexin] Rash   Tape Rash    FAMILY HISTORY:  family history includes Alcohol abuse in her father; Colon polyps in her father; Heart disease in her father. SOCIAL  HISTORY:  reports that she has never smoked. She has never been exposed to tobacco smoke. She has never used smokeless tobacco. She reports that she does not drink alcohol and does not use drugs.   Review of Systems:  Gen:  Denies  fever, sweats, chills weight loss  HEENT: Denies blurred vision, double vision, ear pain, eye pain, hearing loss, nose bleeds, sore throat Cardiac:  No dizziness, chest pain or heaviness, chest tightness,edema, No JVD Resp:   No cough, -sputum production, -shortness of breath,-wheezing, -hemoptysis,  Gi: Denies swallowing difficulty, stomach pain, nausea or vomiting, diarrhea, constipation, bowel incontinence Gu:  Denies bladder incontinence, burning urine Ext:   Denies Joint pain, stiffness or swelling Skin: Denies  skin rash, easy bruising or bleeding or hives Endoc:  Denies polyuria, polydipsia , polyphagia or weight change Psych:   Denies depression, insomnia or hallucinations  Other:  All other systems negative  VITAL SIGNS: BP 120/80 (BP Location: Right Wrist, Cuff Size: Large)   Pulse (!) 59   Temp (!) 97.1 F (36.2 C)   Ht 5\' 5"  (1.651 m)   Wt 289 lb (131.1 kg) Comment: per patient in a wheelchair today  LMP  (LMP Unknown)   SpO2 95%   BMI 48.09 kg/m     Physical Examination:   General Appearance: No distress  EYES PERRLA, EOM intact.   NECK Supple, No JVD Pulmonary: normal breath sounds, No wheezing.  CardiovascularNormal S1,S2.  No m/r/g.   Abdomen: Benign, Soft, non-tender. Skin:   warm, no rashes, no ecchymosis  Extremities: normal, no cyanosis, clubbing. Neuro:without focal findings,  speech normal  PSYCHIATRIC: Mood, affect within normal limits.   ASSESSMENT AND PLAN  OSA Patient demonstrates excellent CPAP compliance. She is using and benefiting from CPAP therapy. For dry mouth, increased humidity level to 5. Supply order sent to DME. Discussed the consequences of untreated sleep apnea. Advised not to drive drowsy for safety  of patient and others. Will follow up in 1 year to review CPAP efficacy and compliance data.    HTN Stable, on current management. Following with PCP.   Morbid obesity Counseled patient on diet and lifestyle modification.    Patient  satisfied with Plan of action and management. All questions answered   I spent a total of  45 minutes reviewing chart data, face-to-face evaluation with the patient, counseling and coordination of care as detailed above.    Tempie Hoist, M.D.  Sleep Medicine [facility]

## 2023-03-16 ENCOUNTER — Ambulatory Visit: Payer: Medicare Other | Attending: Cardiology | Admitting: Cardiology

## 2023-03-16 ENCOUNTER — Encounter: Payer: Self-pay | Admitting: Nurse Practitioner

## 2023-03-16 ENCOUNTER — Encounter: Payer: Self-pay | Admitting: Cardiology

## 2023-03-16 VITALS — BP 168/94 | HR 57 | Ht 65.0 in | Wt 294.0 lb

## 2023-03-16 DIAGNOSIS — R002 Palpitations: Secondary | ICD-10-CM | POA: Diagnosis present

## 2023-03-16 DIAGNOSIS — I10 Essential (primary) hypertension: Secondary | ICD-10-CM | POA: Diagnosis present

## 2023-03-16 DIAGNOSIS — Z79899 Other long term (current) drug therapy: Secondary | ICD-10-CM | POA: Insufficient documentation

## 2023-03-16 MED ORDER — TRIAMTERENE-HCTZ 37.5-25 MG PO TABS: 1.0000 | ORAL_TABLET | Freq: Every day | ORAL | 3 refills | Status: DC

## 2023-03-16 NOTE — Patient Instructions (Addendum)
 Medication Instructions:  Your physician recommends the following medication changes.  STOP TAKING: Benicar Lasix Hydralazine   START TAKING: Triamterene-hydrochlorothiazide 25-37.5 mg by mouth daily    *If you need a refill on your cardiac medications before your next appointment, please call your pharmacy*   Lab Work: Your provider would like for you to return in 10 days to have the following labs drawn: BMP.   Please go to [facility] [address] ([facility] [address], [place] [address] You do not need an appointment.  They are open from 8 am- 4:30 pm.  Lunch from 1:00 pm- 2:00 pm You will need to be fasting.     Testing/Procedures: No test ordered today    Follow-Up: At [facility], you and your health needs are our priority.  As part of our continuing mission to provide you with exceptional heart care, we have created designated Provider Care Teams.  These Care Teams include your primary Cardiologist (physician) and Advanced Practice Providers (APPs -  Physician Assistants and Nurse Practitioners) who all work together to provide you with the care you need, when you need it.  We recommend signing up for the patient portal called "MyChart".  Sign up information is provided on this After Visit Summary.  MyChart is used to connect with patients for Virtual Visits (Telemedicine).  Patients are able to view lab/test results, encounter notes, upcoming appointments, etc.  Non-urgent messages can be sent to your provider as well.   To learn more about what you can do with MyChart, go to [email or web address].    Your next appointment:   6 week(s)  Provider:   You may see Debbe Odea, MD or one of the following Advanced Practice Providers on your designated Care Team:   Nicolasa Ducking, NP Eula Listen, PA-C Cadence Fransico Michael, PA-C Charlsie Quest, NP Carlos Levering, NP

## 2023-03-16 NOTE — Progress Notes (Signed)
 Cardiology Office Note:    Date:  03/16/2023   ID:  Lindsey Jacobs, DOB 10-18-1935, MRN [id number]  PCP:  Berniece Salines, FNP   [facility] Providers Cardiologist:  Debbe Odea, MD     Referring MD: Berniece Salines, FNP   No chief complaint on file.   History of Present Illness:    Lindsey Jacobs is a 88 y.o. female with a hx of hypertension, GERD, obesity who presents due to palpitations and BP fluctuations.  States being on HCTZ triamterene for over 30 years with no issues.  Primary care provider switch her medications to Benicar, Lasix about a year ago.  She has noticed palpitations since switching medications.  Also endorsed having BP fluctuations.  Has a history of renal dysfunction over the past 5 years, creatinine has stayed about the same with medication changes.  Cardiac testing Cardiac monitor/2024 no sustained arrhythmias, no A-fib or flutter.  1 nonsustained VT lasting 4 beats.  Past Medical History:  Diagnosis Date   Allergy    Colon polyps    Depression    GERD (gastroesophageal reflux disease)    Glaucoma    Hyperlipidemia    Hypertension    Sleep apnea    Urinary incontinence     Past Surgical History:  Procedure Laterality Date   ABDOMINAL HYSTERECTOMY  1984   BREAST EXCISIONAL BIOPSY Right    benign   CARPAL TUNNEL RELEASE Right 1990   CATARACT EXTRACTION, BILATERAL Bilateral 2004   CESAREAN SECTION  1974   CHOLECYSTECTOMY  1974   COLONOSCOPY WITH PROPOFOL N/A 05/11/2022   Procedure: COLONOSCOPY WITH PROPOFOL;  Surgeon: Midge Minium, MD;  Location: [facility] ENDOSCOPY;  Service: Endoscopy;  Laterality: N/A;   ESOPHAGOGASTRODUODENOSCOPY (EGD) WITH PROPOFOL N/A 05/20/2019   Procedure: ESOPHAGOGASTRODUODENOSCOPY (EGD) WITH PROPOFOL;  Surgeon: Pasty Spillers, MD;  Location: [facility] ENDOSCOPY;  Service: Endoscopy;  Laterality: N/A;   EYE SURGERY     JOINT REPLACEMENT     REPLACEMENT TOTAL KNEE Left 2007   REPLACEMENT TOTAL KNEE BILATERAL Right  2006   VARICOSE VEIN SURGERY Left 1965    Current Medications: Current Meds  Medication Sig   Carboxymeth-Glyc-Polysorb PF (REFRESH DIGITAL PF) 0.5-1-0.5 % SOLN Apply to eye.   cetirizine (ZYRTEC) 10 MG tablet Take 1 tablet (10 mg total) by mouth daily.   Cholecalciferol (VITAMIN D3 MAXIMUM STRENGTH PO) Take by mouth.   cycloSPORINE (RESTASIS) 0.05 % ophthalmic emulsion SMARTSIG:In Eye(s)   DULoxetine (CYMBALTA) 60 MG capsule TAKE 1 CAPSULE DAILY   fluticasone (FLONASE) 50 MCG/ACT nasal spray Place 2 sprays into both nostrils daily.   furosemide (LASIX) 20 MG tablet TAKE 1 TABLET DAILY   hydrALAZINE (APRESOLINE) 10 MG tablet TAKE 1 TABLET TWICE A DAY   latanoprost (XALATAN) 0.005 % ophthalmic solution 1 drop at bedtime.   Latanoprostene Bunod (VYZULTA) 0.024 % SOLN INSTILL 1 DROP IN BOTH EYES DAILY IN THE EVENING   Magnesium Oxide 500 MG TABS Take by mouth. Patient is taking 2 tablet once daily.   Multiple Vitamins-Minerals (ADVANCED EYE HEALTH PO) Take by mouth.   naproxen (NAPROSYN) 500 MG tablet Take 1 tablet (500 mg total) by mouth daily as needed for moderate pain. Take with meal   olmesartan (BENICAR) 40 MG tablet TAKE 1 TABLET DAILY   pantoprazole (PROTONIX) 40 MG tablet Take 1 tablet (40 mg total) by mouth daily.   triamterene-hydrochlorothiazide (MAXZIDE-25) 37.5-25 MG tablet Take 1 tablet by mouth daily.     Allergies:   Amoxapine  and related, Amoxicillin, Diamox [acetazolamide], Keflex [cephalexin], and Tape   Social History   Socioeconomic History   Marital status: Widowed    Spouse name: Not on file   Number of children: Not on file   Years of education: Not on file   Highest education level: Associate degree: academic program  Occupational History   Occupation: retired  Tobacco Use   Smoking status: Never    Passive exposure: Never   Smokeless tobacco: Never  Vaping Use   Vaping status: Never Used  Substance and Sexual Activity   Alcohol use: Never   Drug  use: Never   Sexual activity: Not Currently  Other Topics Concern   Not on file  Social History Narrative   Not on file   Social Drivers of Health   Financial Resource Strain: Low Risk  (12/26/2022)   Overall Financial Resource Strain (CARDIA)    Difficulty of Paying Living Expenses: Not very hard  Food Insecurity: No Food Insecurity (12/26/2022)   Hunger Vital Sign    Worried About Running Out of Food in the Last Year: Never true    Ran Out of Food in the Last Year: Never true  Transportation Needs: No Transportation Needs (12/26/2022)   PRAPARE - Administrator, Civil Service (Medical): No    Lack of Transportation (Non-Medical): No  Physical Activity: Inactive (05/19/2022)   Exercise Vital Sign    Days of Exercise per Week: 0 days    Minutes of Exercise per Session: 0 min  Stress: No Stress Concern Present (05/19/2022)   [employer] of Occupational Health - Occupational Stress Questionnaire    Feeling of Stress : Not at all  Social Connections: Moderately Integrated (12/26/2022)   Social Connection and Isolation Panel [NHANES]    Frequency of Communication with Friends and Family: More than three times a week    Frequency of Social Gatherings with Friends and Family: Twice a week    Attends Religious Services: More than 4 times per year    Active Member of [employer] or Organizations: Yes    Attends Engineer, structural: More than 4 times per year    Marital Status: Divorced     Family History: The patient's family history includes Alcohol abuse in her father; Colon polyps in her father; Heart disease in her father. There is no history of Breast cancer.  ROS:   Please see the history of present illness.     All other systems reviewed and are negative.  EKGs/Labs/Other Studies Reviewed:    The following studies were reviewed today:  EKG Interpretation Date/Time:  Friday March 16 2023 10:56:47 EST Ventricular Rate:  57 PR Interval:  174 QRS  Duration:  96 QT Interval:  368 QTC Calculation: 358 R Axis:   -36  Text Interpretation: Sinus bradycardia Left axis deviation Moderate voltage criteria for LVH, may be normal variant ( R in aVL , Cornell product ) Confirmed by Debbe Odea ([id number]) on 03/16/2023 11:22:14 AM    Recent Labs: 02/23/2023: ALT 8; BUN 14; Creat 1.23; Hemoglobin 13.4; Platelets 232; Potassium 4.8; Sodium 143  Recent Lipid Panel    Component Value Date/Time   CHOL 184 06/23/2022 1001   TRIG 98 06/23/2022 1001   HDL 66 06/23/2022 1001   CHOLHDL 2.8 06/23/2022 1001   LDLCALC 99 06/23/2022 1001     Risk Assessment/Calculations:          Physical Exam:    VS:  BP (!) 168/94 (BP Location:  Left Arm, Patient Position: Sitting, Cuff Size: Large)   Pulse (!) 57   Ht 5\' 5"  (1.651 m)   Wt 294 lb (133.4 kg)   LMP  (LMP Unknown)   SpO2 98%   BMI 48.92 kg/m     Wt Readings from Last 3 Encounters:  03/16/23 294 lb (133.4 kg)  03/14/23 289 lb (131.1 kg)  03/12/23 289 lb (131.1 kg)     GEN:  Well nourished, well developed in no acute distress HEENT: Normal NECK: No JVD; No carotid bruits CARDIAC: RRR, no murmurs, rubs, gallops RESPIRATORY:  Clear to auscultation without rales, wheezing or rhonchi  ABDOMEN: Soft, non-tender, non-distended MUSCULOSKELETAL:  No edema; No deformity  SKIN: Warm and dry NEUROLOGIC:  Alert and oriented x 3 PSYCHIATRIC:  Normal affect   ASSESSMENT:    1. Primary hypertension   2. Morbid obesity (HCC)   3. Palpitations   4. Medication management    PLAN:    In order of problems listed above:  Hypertension, BP controlled.  Okay to restart HCTZ-triamterene 25-37.5 mg daily.  Check BMP in 10 days.  Monitor BP at home and keep log.  She has a history of mild CKD, creatinine has stayed stable over the past 5 years.  Apparently not tolerating current medical regimen with symptoms of palpitations. Morbid obesity, recommend low-calorie diet, weight loss . Palpitations,  cardiac monitor with no significant sustained arrhythmias.  Monitor off AV nodal agents.  Baseline bradycardic with heart rate 57.  Follow-up in 6 to 8 weeks.     Medication Adjustments/Labs and Tests Ordered: Current medicines are reviewed at length with the patient today.  Concerns regarding medicines are outlined above.  Orders Placed This Encounter  Procedures   Basic metabolic panel   EKG 12-Lead   Meds ordered this encounter  Medications   triamterene-hydrochlorothiazide (MAXZIDE-25) 37.5-25 MG tablet    Sig: Take 1 tablet by mouth daily.    Dispense:  90 tablet    Refill:  3    Patient Instructions  Medication Instructions:  Your physician recommends the following medication changes.  STOP TAKING: Benicar Lasix Hydralazine   START TAKING: Triamterene-hydrochlorothiazide 25-37.5 mg by mouth daily    *If you need a refill on your cardiac medications before your next appointment, please call your pharmacy*   Lab Work: Your provider would like for you to return in 10 days to have the following labs drawn: BMP.   Please go to [facility] [address] ([facility] [address], [place] [address] You do not need an appointment.  They are open from 8 am- 4:30 pm.  Lunch from 1:00 pm- 2:00 pm You will need to be fasting.     Testing/Procedures: No test ordered today    Follow-Up: At [facility], you and your health needs are our priority.  As part of our continuing mission to provide you with exceptional heart care, we have created designated Provider Care Teams.  These Care Teams include your primary Cardiologist (physician) and Advanced Practice Providers (APPs -  Physician Assistants and Nurse Practitioners) who all work together to provide you with the care you need, when you need it.  We recommend signing up for the patient portal called "MyChart".  Sign up information is provided on this After Visit Summary.  MyChart is used to  connect with patients for Virtual Visits (Telemedicine).  Patients are able to view lab/test results, encounter notes, upcoming appointments, etc.  Non-urgent messages can be sent to  your provider as well.   To learn more about what you can do with MyChart, go to [email or web address].    Your next appointment:   6 week(s)  Provider:   You may see Debbe Odea, MD or one of the following Advanced Practice Providers on your designated Care Team:   Nicolasa Ducking, NP Eula Listen, PA-C Cadence Fransico Michael, PA-C Charlsie Quest, NP Carlos Levering, NP      Signed, Debbe Odea, MD  03/16/2023 12:14 PM    [facility]

## 2023-03-19 ENCOUNTER — Other Ambulatory Visit: Payer: Self-pay | Admitting: Internal Medicine

## 2023-03-19 ENCOUNTER — Other Ambulatory Visit: Payer: Self-pay

## 2023-03-19 ENCOUNTER — Encounter: Payer: Self-pay | Admitting: Cardiology

## 2023-03-19 MED ORDER — TRIAMTERENE-HCTZ 37.5-25 MG PO TABS: 1.0000 | ORAL_TABLET | Freq: Every day | ORAL | 0 refills | Status: DC

## 2023-03-20 NOTE — Telephone Encounter (Signed)
 Requested Prescriptions  Pending Prescriptions Disp Refills   hydrALAZINE (APRESOLINE) 10 MG tablet [Pharmacy Med Name: HYDRALAZINE TABS 10MG ] 180 tablet 0    Sig: TAKE 1 TABLET TWICE A DAY     Cardiovascular:  Vasodilators Failed - 03/20/2023 11:23 AM      Failed - ANA Screen, Ifa, Serum in normal range and within 360 days    Anti Nuclear Antibody (ANA)  Date Value Ref Range Status  06/25/2019 NEGATIVE NEGATIVE Final    Comment:    ANA IFA is a first line screen for detecting the presence of up to approximately 150 autoantibodies in various autoimmune diseases. A negative ANA IFA result suggests an ANA-associated autoimmune disease is not present at this time, but is not definitive. If there is high clinical suspicion for Sjogren's syndrome, testing for anti-SS-A/Ro antibody should be considered. Anti-Jo-1 antibody should be considered for clinically suspected inflammatory myopathies. . AC-0: Negative . International Consensus on ANA Patterns (SeverTies.uy) . For additional information, please refer to http://education.[facility].com/faq/FAQ177 (This link is being provided for informational/ educational purposes only.) .          Failed - Last BP in normal range    BP Readings from Last 1 Encounters:  03/16/23 (!) 168/94         Passed - HCT in normal range and within 360 days    HCT  Date Value Ref Range Status  02/23/2023 41.0 35.0 - 45.0 % Final         Passed - HGB in normal range and within 360 days    Hemoglobin  Date Value Ref Range Status  02/23/2023 13.4 11.7 - 15.5 g/dL Final         Passed - RBC in normal range and within 360 days    RBC  Date Value Ref Range Status  02/23/2023 4.74 3.80 - 5.10 Million/uL Final         Passed - WBC in normal range and within 360 days    WBC  Date Value Ref Range Status  02/23/2023 6.0 3.8 - 10.8 Thousand/uL Final         Passed - PLT in normal range and within 360 days     Platelets  Date Value Ref Range Status  02/23/2023 232 140 - 400 Thousand/uL Final         Passed - Valid encounter within last 12 months    Recent Outpatient Visits           2 months ago Allergy, initial encounter   [facility] Berniece Salines, FNP   6 months ago Burning with urination   [facility] Berniece Salines, FNP   7 months ago Essential hypertension   [employer] [facility] Ronnald Ramp, MD   9 months ago Prediabetes   [facility] [place], Salvadore Oxford, NP   11 months ago Rectal mass   [facility], Salvadore Oxford, NP       Future Appointments             In 1 month Agbor-Etang, Arlys John, MD [facility] [place]   In 5 months Zane Herald, Rudolpho Sevin, FNP [facility]

## 2023-03-30 LAB — BASIC METABOLIC PANEL
BUN/Creatinine Ratio: 12 (ref 12–28)
BUN: 15 mg/dL (ref 8–27)
CO2: 26 mmol/L (ref 20–29)
Calcium: 9.7 mg/dL (ref 8.7–10.3)
Chloride: 103 mmol/L (ref 96–106)
Creatinine, Ser: 1.25 mg/dL — ABNORMAL HIGH (ref 0.57–1.00)
Glucose: 79 mg/dL (ref 70–99)
Potassium: 4.4 mmol/L (ref 3.5–5.2)
Sodium: 141 mmol/L (ref 134–144)
eGFR: 42 mL/min/{1.73_m2} — ABNORMAL LOW (ref >59)

## 2023-04-06 ENCOUNTER — Encounter: Payer: Self-pay | Admitting: *Deleted

## 2023-04-30 ENCOUNTER — Ambulatory Visit: Payer: Medicare Other | Attending: Cardiology | Admitting: Cardiology

## 2023-04-30 ENCOUNTER — Encounter: Payer: Self-pay | Admitting: Cardiology

## 2023-04-30 VITALS — BP 150/80 | HR 66 | Resp 12 | Ht 65.0 in | Wt 287.4 lb

## 2023-04-30 DIAGNOSIS — I10 Essential (primary) hypertension: Secondary | ICD-10-CM | POA: Insufficient documentation

## 2023-04-30 NOTE — Progress Notes (Signed)
 Cardiology Office Note:    Date:  04/30/2023   ID:  Lindsey Jacobs, DOB Apr 27, 1935, MRN [id number]  PCP:  Berniece Salines, FNP   [facility] Providers Cardiologist:  Debbe Odea, MD     Referring MD: Berniece Salines, FNP   Chief Complaint  Patient presents with   Primary hypertension   Follow-up    6 weeks    History of Present Illness:    Lindsey Jacobs is a 88 y.o. female with a hx of hypertension, GERD, obesity who presents for follow-up.  Being seen for hypertension.  Did not tolerate Benicar and Lasix due to symptoms of palpitations, this was stopped on previous visit.  Previously tolerated Maxide-25 was restarted.  Has been on this medicine over 30 years with no issues.  She states feeling much better, blood pressures at home are adequately controlled with systolics in the 120s to 130s, diastolics 60-70.  Feels well, has no concerns at this time.   Cardiac testing Cardiac monitor/2024 no sustained arrhythmias, no A-fib or flutter.  1 nonsustained VT lasting 4 beats.  Past Medical History:  Diagnosis Date   Allergy    Colon polyps    Depression    GERD (gastroesophageal reflux disease)    Glaucoma    Hyperlipidemia    Hypertension    Sleep apnea    Urinary incontinence     Past Surgical History:  Procedure Laterality Date   ABDOMINAL HYSTERECTOMY  1984   BREAST EXCISIONAL BIOPSY Right    benign   CARPAL TUNNEL RELEASE Right 1990   CATARACT EXTRACTION, BILATERAL Bilateral 2004   CESAREAN SECTION  1974   CHOLECYSTECTOMY  1974   COLONOSCOPY WITH PROPOFOL N/A 05/11/2022   Procedure: COLONOSCOPY WITH PROPOFOL;  Surgeon: Midge Minium, MD;  Location: [facility] ENDOSCOPY;  Service: Endoscopy;  Laterality: N/A;   ESOPHAGOGASTRODUODENOSCOPY (EGD) WITH PROPOFOL N/A 05/20/2019   Procedure: ESOPHAGOGASTRODUODENOSCOPY (EGD) WITH PROPOFOL;  Surgeon: Pasty Spillers, MD;  Location: [facility] ENDOSCOPY;  Service: Endoscopy;  Laterality: N/A;   EYE SURGERY     JOINT  REPLACEMENT     REPLACEMENT TOTAL KNEE Left 2007   REPLACEMENT TOTAL KNEE BILATERAL Right 2006   VARICOSE VEIN SURGERY Left 1965    Current Medications: Current Meds  Medication Sig   Carboxymeth-Glyc-Polysorb PF (REFRESH DIGITAL PF) 0.5-1-0.5 % SOLN Apply to eye.   cetirizine (ZYRTEC) 10 MG tablet Take 1 tablet (10 mg total) by mouth daily.   Cholecalciferol (VITAMIN D3 MAXIMUM STRENGTH PO) Take by mouth.   cycloSPORINE (RESTASIS) 0.05 % ophthalmic emulsion SMARTSIG:In Eye(s)   fluticasone (FLONASE) 50 MCG/ACT nasal spray Place 2 sprays into both nostrils daily.   Latanoprostene Bunod (VYZULTA) 0.024 % SOLN INSTILL 1 DROP IN BOTH EYES DAILY IN THE EVENING   Magnesium Oxide 500 MG TABS Take by mouth. Patient is taking 2 tablet once daily.   Multiple Vitamins-Minerals (ADVANCED EYE HEALTH PO) Take by mouth.   naproxen (NAPROSYN) 500 MG tablet Take 1 tablet (500 mg total) by mouth daily as needed for moderate pain. Take with meal   pantoprazole (PROTONIX) 40 MG tablet Take 1 tablet (40 mg total) by mouth daily.   triamterene-hydrochlorothiazide (MAXZIDE-25) 37.5-25 MG tablet Take 1 tablet by mouth daily.   [DISCONTINUED] furosemide (LASIX) 20 MG tablet TAKE 1 TABLET DAILY     Allergies:   Amoxapine and related, Amoxicillin, Diamox [acetazolamide], Keflex [cephalexin], and Tape   Social History   Socioeconomic History   Marital status: Widowed  Spouse name: Not on file   Number of children: Not on file   Years of education: Not on file   Highest education level: Associate degree: academic program  Occupational History   Occupation: retired  Tobacco Use   Smoking status: Never    Passive exposure: Never   Smokeless tobacco: Never  Vaping Use   Vaping status: Never Used  Substance and Sexual Activity   Alcohol use: Never   Drug use: Never   Sexual activity: Not Currently  Other Topics Concern   Not on file  Social History Narrative   Not on file   Social Drivers of  Health   Financial Resource Strain: Low Risk  (12/26/2022)   Overall Financial Resource Strain (CARDIA)    Difficulty of Paying Living Expenses: Not very hard  Food Insecurity: No Food Insecurity (12/26/2022)   Hunger Vital Sign    Worried About Running Out of Food in the Last Year: Never true    Ran Out of Food in the Last Year: Never true  Transportation Needs: No Transportation Needs (12/26/2022)   PRAPARE - Administrator, Civil Service (Medical): No    Lack of Transportation (Non-Medical): No  Physical Activity: Inactive (05/19/2022)   Exercise Vital Sign    Days of Exercise per Week: 0 days    Minutes of Exercise per Session: 0 min  Stress: No Stress Concern Present (05/19/2022)   [employer] of Occupational Health - Occupational Stress Questionnaire    Feeling of Stress : Not at all  Social Connections: Moderately Integrated (12/26/2022)   Social Connection and Isolation Panel [NHANES]    Frequency of Communication with Friends and Family: More than three times a week    Frequency of Social Gatherings with Friends and Family: Twice a week    Attends Religious Services: More than 4 times per year    Active Member of [employer] or Organizations: Yes    Attends Engineer, structural: More than 4 times per year    Marital Status: Divorced     Family History: The patient's family history includes Alcohol abuse in her father; Colon polyps in her father; Heart disease in her father. There is no history of Breast cancer.  ROS:   Please see the history of present illness.     All other systems reviewed and are negative.  EKGs/Labs/Other Studies Reviewed:    The following studies were reviewed today:       Recent Labs: 02/23/2023: ALT 8; Hemoglobin 13.4; Platelets 232 03/29/2023: BUN 15; Creatinine, Ser 1.25; Potassium 4.4; Sodium 141  Recent Lipid Panel    Component Value Date/Time   CHOL 184 06/23/2022 1001   TRIG 98 06/23/2022 1001   HDL 66 06/23/2022  1001   CHOLHDL 2.8 06/23/2022 1001   LDLCALC 99 06/23/2022 1001     Risk Assessment/Calculations:          Physical Exam:    VS:  BP (!) 150/80 (BP Location: Left Arm, Patient Position: Sitting, Cuff Size: Large)   Pulse 66   Resp 12   Ht 5\' 5"  (1.651 m)   Wt 287 lb 6.4 oz (130.4 kg)   LMP  (LMP Unknown)   SpO2 94%   BMI 47.83 kg/m     Wt Readings from Last 3 Encounters:  04/30/23 287 lb 6.4 oz (130.4 kg)  03/16/23 294 lb (133.4 kg)  03/14/23 289 lb (131.1 kg)     GEN:  Well nourished, well developed in no  acute distress HEENT: Normal NECK: No JVD; No carotid bruits CARDIAC: RRR, no murmurs, rubs, gallops RESPIRATORY:  Clear to auscultation without rales, wheezing or rhonchi  ABDOMEN: Soft, non-tender, non-distended MUSCULOSKELETAL:  No edema; No deformity  SKIN: Warm and dry NEUROLOGIC:  Alert and oriented x 3 PSYCHIATRIC:  Normal affect   ASSESSMENT:    1. Primary hypertension   2. Morbid obesity (HCC)     PLAN:    In order of problems listed above:  Hypertension, BP elevated today, controlled at home.  Continue HCTZ-triamterene 25-37.5 mg daily.  Did not tolerate Benicar.  Monitor BP and keep log.  If BP becomes elevated, plan to add Norvasc. Morbid obesity, recommend low-calorie diet, weight loss .  Follow-up in 3 months.     Medication Adjustments/Labs and Tests Ordered: Current medicines are reviewed at length with the patient today.  Concerns regarding medicines are outlined above.  No orders of the defined types were placed in this encounter.  No orders of the defined types were placed in this encounter.   Patient Instructions  Medication Instructions:  NO CHANGES  *If you need a refill on your cardiac medications before your next appointment, please call your pharmacy*   Follow-Up: At [facility], you and your health needs are our priority.  As part of our continuing mission to provide you with exceptional heart care, our  providers are all part of one team.  This team includes your primary Cardiologist (physician) and Advanced Practice Providers or APPs (Physician Assistants and Nurse Practitioners) who all work together to provide you with the care you need, when you need it.  Your next appointment:   3 month(s)  Provider:   Constancia Delton, MD  We recommend signing up for the patient portal called "MyChart".  Sign up information is provided on this After Visit Summary.  MyChart is used to connect with patients for Virtual Visits (Telemedicine).  Patients are able to view lab/test results, encounter notes, upcoming appointments, etc.  Non-urgent messages can be sent to your provider as well.   To learn more about what you can do with MyChart, go to [email or web address].           Signed, Constancia Delton, MD  04/30/2023 10:03 AM    [facility]

## 2023-04-30 NOTE — Patient Instructions (Signed)
 Medication Instructions:  NO CHANGES  *If you need a refill on your cardiac medications before your next appointment, please call your pharmacy*   Follow-Up: At [facility], you and your health needs are our priority.  As part of our continuing mission to provide you with exceptional heart care, our providers are all part of one team.  This team includes your primary Cardiologist (physician) and Advanced Practice Providers or APPs (Physician Assistants and Nurse Practitioners) who all work together to provide you with the care you need, when you need it.  Your next appointment:   3 month(s)  Provider:   Constancia Delton, MD  We recommend signing up for the patient portal called "MyChart".  Sign up information is provided on this After Visit Summary.  MyChart is used to connect with patients for Virtual Visits (Telemedicine).  Patients are able to view lab/test results, encounter notes, upcoming appointments, etc.  Non-urgent messages can be sent to your provider as well.   To learn more about what you can do with MyChart, go to [email or web address].

## 2023-05-24 ENCOUNTER — Ambulatory Visit (INDEPENDENT_AMBULATORY_CARE_PROVIDER_SITE_OTHER): Admitting: General Practice

## 2023-05-24 ENCOUNTER — Encounter: Payer: Self-pay | Admitting: General Practice

## 2023-05-24 ENCOUNTER — Other Ambulatory Visit: Payer: Self-pay | Admitting: General Practice

## 2023-05-24 VITALS — BP 130/84 | HR 58 | Temp 98.2°F | Ht 65.0 in | Wt 299.0 lb

## 2023-05-24 DIAGNOSIS — K219 Gastro-esophageal reflux disease without esophagitis: Secondary | ICD-10-CM

## 2023-05-24 DIAGNOSIS — I10 Essential (primary) hypertension: Secondary | ICD-10-CM

## 2023-05-24 DIAGNOSIS — M150 Primary generalized (osteo)arthritis: Secondary | ICD-10-CM | POA: Diagnosis not present

## 2023-05-24 DIAGNOSIS — N1831 Chronic kidney disease, stage 3a: Secondary | ICD-10-CM | POA: Diagnosis not present

## 2023-05-24 DIAGNOSIS — M5412 Radiculopathy, cervical region: Secondary | ICD-10-CM

## 2023-05-24 DIAGNOSIS — G4733 Obstructive sleep apnea (adult) (pediatric): Secondary | ICD-10-CM

## 2023-05-24 DIAGNOSIS — I700 Atherosclerosis of aorta: Secondary | ICD-10-CM | POA: Diagnosis not present

## 2023-05-24 DIAGNOSIS — E66813 Obesity, class 3: Secondary | ICD-10-CM

## 2023-05-24 DIAGNOSIS — Z6841 Body Mass Index (BMI) 40.0 and over, adult: Secondary | ICD-10-CM

## 2023-05-24 DIAGNOSIS — E7800 Pure hypercholesterolemia, unspecified: Secondary | ICD-10-CM

## 2023-05-24 DIAGNOSIS — Z7689 Persons encountering health services in other specified circumstances: Secondary | ICD-10-CM

## 2023-05-24 MED ORDER — NAPROXEN 500 MG PO TABS
500.0000 mg | ORAL_TABLET | Freq: Every day | ORAL | 0 refills | Status: DC | PRN
Start: 2023-05-24 — End: 2023-05-24

## 2023-05-24 MED ORDER — NAPROXEN 500 MG PO TABS
500.0000 mg | ORAL_TABLET | Freq: Every day | ORAL | 0 refills | Status: DC | PRN
Start: 2023-05-24 — End: 2023-08-24

## 2023-05-24 MED ORDER — NAPROXEN 500 MG PO TABS: 500.0000 mg | ORAL_TABLET | Freq: Every day | ORAL | 0 refills | Status: DC | PRN

## 2023-05-24 NOTE — Assessment & Plan Note (Signed)
 Discussed the importance of healthy diet and exercise to affect sustainable weight loss.

## 2023-05-24 NOTE — Progress Notes (Signed)
 New Patient Office Visit  Subjective    Patient ID: Lindsey Jacobs, female    DOB: 31-Jan-1935  Age: 88 y.o. MRN: [id number]  CC:  Chief Complaint  Patient presents with   New Patient (Initial Visit)   Ankle Pain    Patient has severe arthritis in her ankles that causes a lot of pain. Patient also has swelling. Patient needs naproxen  refilled now to [facility] (short dose) and [facility] day supply [facility].     HPI Lindsey Jacobs is a 88 y.o. female presents to establish care.   Her daughter, Lindsey Jacobs, is also present.   Last PCP/physical/labs: Lindsey Jacobs  Ankle pain: Bilateral. She has been using a wheelchair since 2019. She was using a walker for 10 years prior to that. She has been diagnosed with arthritis. She was previously evaluated by orthopedics and she has been told she has severe pronation. Pronation is worse in the right. On x-ray she was told the left ankle was worse. One of her previous PCP prior to Lindsey Gall, Jacobs; she was started on Naproxen  500 mg as needed. She has been swimming for a long time. She has tried physical therapy in the past. She is followed with [facility] and plans to discuss steroid injection in her ankle. She is also following with them for her shoulder injection.   HTN/HLD: following with cardiology. She did not tolerate benicar  and lasix  and was restarted on hydrochlorothiazide-triamterene  25-37.5 mg daily. She denies any palpitations. The home BP readings have been in the 120's / 60s-70's range. She denies any blurred vision, headache, chest pain or shortness of breath. She has been diagnosed with HLD many years. She has not been on any medication.   OSA- followed by pulmonology. Uses a CPAP. No concerns. She has been told to follow up in one year.   GERD: diagnosed many years ago. She has been taking pantoprazole  40 mg once daily. She does monitor her diet and knows her triggers. She would like to try to wean off or try to take it every  other day. She denies any nausea, vomiting, diarrhea and constipation.   Depression: diagnosed many years ago. She has been Cymbalta  60 mg once daily. She was taking care of her husband at the time and she was started on Cymbalta .   Outpatient Encounter Medications as of 05/24/2023  Medication Sig   Carboxymeth-Glyc-Polysorb PF (REFRESH DIGITAL PF) 0.5-1-0.5 % SOLN Apply to eye.   cetirizine  (ZYRTEC ) 10 MG tablet Take 1 tablet (10 mg total) by mouth daily.   Cholecalciferol (VITAMIN D3 MAXIMUM STRENGTH PO) Take by mouth.   cycloSPORINE (RESTASIS) 0.05 % ophthalmic emulsion SMARTSIG:In Eye(s)   DULoxetine  (CYMBALTA ) 60 MG capsule Take 60 mg by mouth daily.   fluticasone  (FLONASE ) 50 MCG/ACT nasal spray Place 2 sprays into both nostrils daily.   Latanoprostene Bunod (VYZULTA) 0.024 % SOLN INSTILL 1 DROP IN BOTH EYES DAILY IN THE EVENING   Magnesium Oxide 500 MG TABS Take by mouth. Patient is taking 2 tablet once daily.   Multiple Vitamins-Minerals (ADVANCED EYE HEALTH PO) Take by mouth.   pantoprazole  (PROTONIX ) 40 MG tablet Take 1 tablet (40 mg total) by mouth daily.   triamterene -hydrochlorothiazide (MAXZIDE-25) 37.5-25 MG tablet Take 1 tablet by mouth daily.   [DISCONTINUED] naproxen  (NAPROSYN ) 500 MG tablet Take 1 tablet (500 mg total) by mouth daily as needed for moderate pain. Take with meal   naproxen  (NAPROSYN ) 500 MG tablet Take 1 tablet (500 mg  total) by mouth daily as needed for moderate pain (pain score 4-6). Take with meal   [DISCONTINUED] naproxen  (NAPROSYN ) 500 MG tablet Take 1 tablet (500 mg total) by mouth daily as needed for moderate pain (pain score 4-6). Take with meal   No facility-administered encounter medications on file as of 05/24/2023.    Past Medical History:  Diagnosis Date   Allergy    Colon polyps    Depression    GERD (gastroesophageal reflux disease)    Glaucoma    Hyperlipidemia    Hypertension    Sleep apnea    Urinary incontinence     Past Surgical  History:  Procedure Laterality Date   ABDOMINAL HYSTERECTOMY  1984   BREAST EXCISIONAL BIOPSY Right    benign   CARPAL TUNNEL RELEASE Right 1990   CATARACT EXTRACTION, BILATERAL Bilateral 2004   CESAREAN SECTION  1974   CHOLECYSTECTOMY  1974   COLONOSCOPY WITH PROPOFOL  N/A 05/11/2022   Procedure: COLONOSCOPY WITH PROPOFOL ;  Surgeon: Marnee Sink, MD;  Location: [facility];  Service: Endoscopy;  Laterality: N/A;   ESOPHAGOGASTRODUODENOSCOPY (EGD) WITH PROPOFOL  N/A 05/20/2019   Procedure: ESOPHAGOGASTRODUODENOSCOPY (EGD) WITH PROPOFOL ;  Surgeon: Irby Mannan, MD;  Location: [facility];  Service: Endoscopy;  Laterality: N/A;   EYE SURGERY     JOINT REPLACEMENT     REPLACEMENT TOTAL KNEE Left 2007   REPLACEMENT TOTAL KNEE BILATERAL Right 2006   VARICOSE VEIN SURGERY Left 1965    Family History  Problem Relation Age of Onset   Heart disease Father    Colon polyps Father    Alcohol abuse Father    Breast cancer Neg Hx     Social History   Socioeconomic History   Marital status: Widowed    Spouse name: Not on file   Number of children: Not on file   Years of education: Not on file   Highest education level: Associate degree: academic program  Occupational History   Occupation: retired  Tobacco Use   Smoking status: Never    Passive exposure: Never   Smokeless tobacco: Never  Vaping Use   Vaping status: Never Used  Substance and Sexual Activity   Alcohol use: Never   Drug use: Never   Sexual activity: Not Currently  Other Topics Concern   Not on file  Social History Narrative   Not on file   Social Drivers of Health   Financial Resource Strain: Low Risk  (05/23/2023)   Overall Financial Resource Strain (CARDIA)    Difficulty of Paying Living Expenses: Not hard at all  Food Insecurity: No Food Insecurity (05/23/2023)   Hunger Vital Sign    Worried About Running Out of Food in the Last Year: Never true    Ran Out of Food in the Last Year: Never true   Transportation Needs: No Transportation Needs (05/23/2023)   PRAPARE - Administrator, Civil Service (Medical): No    Lack of Transportation (Non-Medical): No  Physical Activity: Inactive (05/19/2022)   Exercise Vital Sign    Days of Exercise per Week: 0 days    Minutes of Exercise per Session: 0 min  Stress: No Stress Concern Present (05/23/2023)   [employer] of Occupational Health - Occupational Stress Questionnaire    Feeling of Stress : Not at all  Social Connections: Moderately Integrated (05/23/2023)   Social Connection and Isolation Panel [NHANES]    Frequency of Communication with Friends and Family: More than three times a week  Frequency of Social Gatherings with Friends and Family: Once a week    Attends Religious Services: More than 4 times per year    Active Member of [employer] or Organizations: Yes    Attends Banker Meetings: More than 4 times per year    Marital Status: Widowed  Intimate Partner Violence: Not At Risk (05/19/2022)   Humiliation, Afraid, Rape, and Kick questionnaire    Fear of Current or Ex-Partner: No    Emotionally Abused: No    Physically Abused: No    Sexually Abused: No    Review of Systems  Constitutional:  Negative for chills and fever.  Respiratory:  Negative for shortness of breath.   Cardiovascular:  Negative for chest pain.  Gastrointestinal:  Negative for abdominal pain, constipation, diarrhea, heartburn, nausea and vomiting.  Genitourinary:  Negative for dysuria, frequency and urgency.  Musculoskeletal:  Positive for joint pain.       Ankle pain.  Neurological:  Negative for dizziness and headaches.  Endo/Heme/Allergies:  Negative for polydipsia.  Psychiatric/Behavioral:  Negative for depression and suicidal ideas. The patient is not nervous/anxious.         Objective    BP 130/84 (BP Location: Left Arm, Patient Position: Sitting, Cuff Size: Large)   Pulse (!) 58   Temp 98.2 F (36.8 C) (Oral)   Ht 5\' 5"   (1.651 m)   Wt 299 lb (135.6 kg)   LMP  (LMP Unknown)   SpO2 99%   BMI 49.76 kg/m   Physical Exam Vitals and nursing note reviewed.  Constitutional:      Appearance: Normal appearance.  Cardiovascular:     Rate and Rhythm: Normal rate and regular rhythm.     Pulses: Normal pulses.     Heart sounds: Normal heart sounds.  Pulmonary:     Effort: Pulmonary effort is normal.     Breath sounds: Normal breath sounds.  Musculoskeletal:        General: No swelling or tenderness.  Neurological:     Mental Status: She is alert and oriented to person, place, and time.  Psychiatric:        Mood and Affect: Mood normal.        Behavior: Behavior normal.        Thought Content: Thought content normal.        Judgment: Judgment normal.         Assessment & Plan:  Aortic atherosclerosis (HCC) Assessment & Plan: Followed by cardiology.  No concerns today.   Primary osteoarthritis involving multiple joints Assessment & Plan: Chronic.  In multiple joints including shoulders, ankles, and cervical spine.  Managed with naproxen  500 mg as needed. Refill provided.   Continue with ortho.   Orders: -     Naproxen ; Take 1 tablet (500 mg total) by mouth daily as needed for moderate pain (pain score 4-6). Take with meal  Dispense: 10 tablet; Refill: 0  Cervical radiculitis Assessment & Plan: Followed by orthopedics.  Has been getting steroid injections.  Has a follow up scheduled with them.   Stage 3a chronic kidney disease (HCC) Assessment & Plan: Chronic.  Controlled. Reviewed CMP from March, 2025. Will check BMP at next visit.   Class 3 severe obesity due to excess calories with body mass index (BMI) of 45.0 to 49.9 in adult Assessment & Plan: Discussed the importance of healthy diet and exercise to affect sustainable weight loss.    Essential hypertension Assessment & Plan: Controlled.  Followed by cardiology. Reviewed  notes from April,2025.   Continue  Triamterene -hydrochlorothiazide 37.5-25 mg once daily.    Establishing care with new doctor, encounter for Assessment & Plan: EMR reviewed briefly.     Gastroesophageal reflux disease without esophagitis Assessment & Plan: Chronic and controlled.   Would like to wean off of pantoprazole .  Handout provided with diet recommendations.   She will take it on Mondays, Wednesdays, Fridays and Sundays.   Pure hypercholesterolemia Assessment & Plan: Chronic and controlled . Reviewed lipid panel from June 2024.    Obstructive sleep apnea syndrome Assessment & Plan: Chronic and stable.  Followed by pulmonology.  Reviewed notes from February.   Continue nightly CPAP.  Follow up with pulmonology in February, 2026 unless you need them sooner.      Return in about 3 months (around 08/24/2023) for chronic care management.Jolanda Nation, NP

## 2023-05-24 NOTE — Assessment & Plan Note (Signed)
 Chronic and controlled.   Would like to wean off of pantoprazole .  Handout provided with diet recommendations.   She will take it on Mondays, Wednesdays, Fridays and Sundays.

## 2023-05-24 NOTE — Assessment & Plan Note (Signed)
 Followed by orthopedics.  Has been getting steroid injections.  Has a follow up scheduled with them.

## 2023-05-24 NOTE — Assessment & Plan Note (Signed)
 Followed by cardiology.  No concerns today.

## 2023-05-24 NOTE — Assessment & Plan Note (Signed)
 Chronic.  Controlled. Reviewed CMP from March, 2025. Will check BMP at next visit.

## 2023-05-24 NOTE — Assessment & Plan Note (Signed)
 Chronic.  In multiple joints including shoulders, ankles, and cervical spine.  Managed with naproxen  500 mg as needed. Refill provided.   Continue with ortho.

## 2023-05-24 NOTE — Assessment & Plan Note (Signed)
 Chronic and stable.  Followed by pulmonology.  Reviewed notes from February.   Continue nightly CPAP.  Follow up with pulmonology in February, 2026 unless you need them sooner.

## 2023-05-24 NOTE — Patient Instructions (Addendum)
 Continue Naproxen  500 mg as needed for ankle pain.   Continue blood pressure medication as prescribed.  Take pantoprazole  40 mg once daily on Monday, Wednesday, Friday and Sunday.   Follow up in 3 months.   It was a pleasure to meet you today! Please don't hesitate to contact me with any questions. Welcome to [facility]!

## 2023-05-24 NOTE — Assessment & Plan Note (Signed)
 Chronic and controlled . Reviewed lipid panel from June 2024.

## 2023-05-24 NOTE — Assessment & Plan Note (Signed)
 Controlled.  Followed by cardiology. Reviewed notes from April,2025.   Continue Triamterene -hydrochlorothiazide 37.5-25 mg once daily.

## 2023-05-24 NOTE — Assessment & Plan Note (Signed)
 EMR reviewed briefly.

## 2023-07-24 ENCOUNTER — Ambulatory Visit (INDEPENDENT_AMBULATORY_CARE_PROVIDER_SITE_OTHER)

## 2023-07-24 VITALS — BP 130/84 | Ht 65.0 in | Wt 297.0 lb

## 2023-07-24 DIAGNOSIS — Z0000 Encounter for general adult medical examination without abnormal findings: Secondary | ICD-10-CM

## 2023-07-24 NOTE — Progress Notes (Signed)
 Because this visit was a virtual/telehealth visit,  certain criteria was not obtained, such a blood pressure, CBG if applicable, and timed get up and go. Any medications not marked as taking were not mentioned during the medication reconciliation part of the visit. Any vitals not documented were not able to be obtained due to this being a telehealth visit or patient was unable to self-report a recent blood pressure reading due to a lack of equipment at home via telehealth. Vitals that have been documented are verbally provided by the patient.   This visit was performed by a medical professional under my direct supervision. I was immediately available for consultation/collaboration. I have reviewed and agree with the Annual Wellness Visit documentation.  Subjective:   Lindsey Jacobs is a 88 y.o. who presents for a Medicare Wellness preventive visit.  As a reminder, Annual Wellness Visits don't include a physical exam, and some assessments may be limited, especially if this visit is performed virtually. We may recommend an in-person follow-up visit with your provider if needed.  Visit Complete: Virtual I connected with  Lindsey Jacobs on 07/24/23 by a audio enabled telemedicine application and verified that I am speaking with the correct person using two identifiers.  Patient Location: Home  Provider Location: Home Office  I discussed the limitations of evaluation and management by telemedicine. The patient expressed understanding and agreed to proceed.  Vital Signs: Because this visit was a virtual/telehealth visit, some criteria may be missing or patient reported. Any vitals not documented were not able to be obtained and vitals that have been documented are patient reported.  VideoDeclined- This patient declined Librarian, academic. Therefore the visit was completed with audio only.  Persons Participating in Visit: Patient.  AWV Questionnaire: No: Patient Medicare  AWV questionnaire was not completed prior to this visit.  Cardiac Risk Factors include: advanced age (>78men, >85 women);obesity (BMI >30kg/m2);hypertension     Objective:    Today's Vitals   07/24/23 0949 07/24/23 0950  BP: 130/84   Weight: 297 lb (134.7 kg)   Height: 5' 5 (1.651 m)   PainSc:  6    Body mass index is 49.42 kg/m.     07/24/2023    9:48 AM 05/19/2022    9:54 AM 05/11/2022    9:53 AM 05/13/2021   10:23 AM 05/04/2020   11:07 AM 05/20/2019    7:24 AM  Advanced Directives  Does Patient Have a Medical Advance Directive? Yes No Yes Yes Yes Yes  Type of Estate agent of [place];Living will   Healthcare Power of [place];Living will Healthcare Power of [place];Living will   Does patient want to make changes to medical advance directive? No - Patient declined   Yes (Inpatient - patient defers changing a medical advance directive and declines information at this time)    Copy of Healthcare Power of Attorney in Chart? No - copy requested   Yes - validated most recent copy scanned in chart (See row information) No - copy requested   Would patient like information on creating a medical advance directive?  No - Patient declined        Current Medications (verified) Outpatient Encounter Medications as of 07/24/2023  Medication Sig   Carboxymeth-Glyc-Polysorb PF (REFRESH DIGITAL PF) 0.5-1-0.5 % SOLN Apply to eye.   cetirizine  (ZYRTEC ) 10 MG tablet Take 1 tablet (10 mg total) by mouth daily.   Cholecalciferol (VITAMIN D3 MAXIMUM STRENGTH PO) Take by mouth.   cycloSPORINE (RESTASIS) 0.05 % ophthalmic  emulsion SMARTSIG:In Eye(s)   DULoxetine  (CYMBALTA ) 60 MG capsule Take 60 mg by mouth daily.   fluticasone  (FLONASE ) 50 MCG/ACT nasal spray Place 2 sprays into both nostrils daily.   Latanoprostene Bunod (VYZULTA) 0.024 % SOLN INSTILL 1 DROP IN BOTH EYES DAILY IN THE EVENING   Magnesium Oxide 500 MG TABS Take by mouth. Patient is taking 2 tablet once daily.    Multiple Vitamins-Minerals (ADVANCED EYE HEALTH PO) Take by mouth.   naproxen  (NAPROSYN ) 500 MG tablet Take 1 tablet (500 mg total) by mouth daily as needed for moderate pain (pain score 4-6). Take with meal   pantoprazole  (PROTONIX ) 40 MG tablet Take 1 tablet (40 mg total) by mouth daily.   triamterene -hydrochlorothiazide (MAXZIDE-25) 37.5-25 MG tablet Take 1 tablet by mouth daily.   No facility-administered encounter medications on file as of 07/24/2023.    Allergies (verified) Amoxapine and related, Amoxicillin, Diamox [acetazolamide], Keflex  [cephalexin ], and Tape   History: Past Medical History:  Diagnosis Date   Allergy    Colon polyps    Depression    GERD (gastroesophageal reflux disease)    Glaucoma    History of chicken pox    Hyperlipidemia    Hypertension    Sleep apnea    Urinary incontinence    Past Surgical History:  Procedure Laterality Date   ABDOMINAL HYSTERECTOMY  1984   BREAST EXCISIONAL BIOPSY Right    benign   CARPAL TUNNEL RELEASE Right 1990   CATARACT EXTRACTION, BILATERAL Bilateral 2004   CESAREAN SECTION  1974   CHOLECYSTECTOMY  1974   COLONOSCOPY WITH PROPOFOL  N/A 05/11/2022   Procedure: COLONOSCOPY WITH PROPOFOL ;  Surgeon: Jinny Carmine, MD;  Location: [facility] ENDOSCOPY;  Service: Endoscopy;  Laterality: N/A;   ESOPHAGOGASTRODUODENOSCOPY (EGD) WITH PROPOFOL  N/A 05/20/2019   Procedure: ESOPHAGOGASTRODUODENOSCOPY (EGD) WITH PROPOFOL ;  Surgeon: Janalyn Keene NOVAK, MD;  Location: [facility] ENDOSCOPY;  Service: Endoscopy;  Laterality: N/A;   EYE SURGERY     JOINT REPLACEMENT     REPLACEMENT TOTAL KNEE Left 2007   REPLACEMENT TOTAL KNEE BILATERAL Right 2006   VARICOSE VEIN SURGERY Left 1965   Family History  Problem Relation Age of Onset   Heart disease Father    Colon polyps Father    Alcohol abuse Father    Breast cancer Neg Hx    Social History   Socioeconomic History   Marital status: Widowed    Spouse name: Not on file   Number of children: Not  on file   Years of education: Not on file   Highest education level: Associate degree: academic program  Occupational History   Occupation: retired  Tobacco Use   Smoking status: Never    Passive exposure: Never   Smokeless tobacco: Never  Vaping Use   Vaping status: Never Used  Substance and Sexual Activity   Alcohol use: Never   Drug use: Never   Sexual activity: Not Currently  Other Topics Concern   Not on file  Social History Narrative   Not on file   Social Drivers of Health   Financial Resource Strain: Low Risk  (07/24/2023)   Overall Financial Resource Strain (CARDIA)    Difficulty of Paying Living Expenses: Not hard at all  Food Insecurity: No Food Insecurity (07/24/2023)   Hunger Vital Sign    Worried About Running Out of Food in the Last Year: Never true    Ran Out of Food in the Last Year: Never true  Transportation Needs: No Transportation Needs (07/24/2023)  PRAPARE - Administrator, Civil Service (Medical): No    Lack of Transportation (Non-Medical): No  Physical Activity: Inactive (07/24/2023)   Exercise Vital Sign    Days of Exercise per Week: 0 days    Minutes of Exercise per Session: 0 min  Stress: No Stress Concern Present (07/24/2023)   [employer] of Occupational Health - Occupational Stress Questionnaire    Feeling of Stress: Not at all  Social Connections: Moderately Integrated (07/24/2023)   Social Connection and Isolation Panel    Frequency of Communication with Friends and Family: More than three times a week    Frequency of Social Gatherings with Friends and Family: Once a week    Attends Religious Services: More than 4 times per year    Active Member of [employer] or Organizations: Yes    Attends Banker Meetings: More than 4 times per year    Marital Status: Widowed    Tobacco Counseling Counseling given: Not Answered    Clinical Intake:  Pre-visit preparation completed: Yes  Pain : 0-10 Pain Score: 6  Pain Type:  Chronic pain Pain Location: Ankle Pain Orientation: Right, Left Pain Descriptors / Indicators: Aching, Throbbing Pain Onset: Today Pain Frequency: Constant Effect of Pain on Daily Activities: walking     BMI - recorded: 49.42 Nutritional Status: BMI > 30  Obese Nutritional Risks: None Diabetes: No  Lab Results  Component Value Date   HGBA1C 5.6 06/23/2022   HGBA1C 5.6 01/30/2022   HGBA1C 5.6 06/20/2021     How often do you need to have someone help you when you read instructions, pamphlets, or other written materials from your doctor or pharmacy?: 1 - Never  Interpreter Needed?: No  Information entered by :: Lindsey Jacobs,cma   Activities of Daily Living     07/24/2023    9:55 AM 12/26/2022    8:32 AM  In your present state of health, do you have any difficulty performing the following activities:  Hearing? 0 1  Vision? 0 0  Difficulty concentrating or making decisions? 0 1  Walking or climbing stairs? 1 1  Dressing or bathing? 1 0  Doing errands, shopping? 0 1  Preparing Food and eating ? N   Using the Toilet? N   In the past six months, have you accidently leaked urine? Y   Do you have problems with loss of bowel control? N   Managing your Medications? N   Managing your Finances? N   Housekeeping or managing your Housekeeping? N     Patient Care Team: Vincente Shivers, NP as PCP - General (General Practice) Darliss Rogue, MD as PCP - Cardiology (Cardiology) Jess Devona BIRCH, MD as Consulting Physician (Sleep Medicine)  I have updated your Care Teams any recent Medical Services you may have received from other providers in the past year.     Assessment:   This is a routine wellness examination for Lindsey Jacobs.  Hearing/Vision screen Hearing Screening - Comments:: No difficulties Vision Screening - Comments:: Wears glasses   Goals Addressed             This Visit's Progress    Patient Stated       To keep living        Depression Screen      07/24/2023    9:58 AM 05/24/2023   10:34 AM 12/26/2022    8:31 AM 09/04/2022   10:55 AM 07/28/2022    9:16 AM 06/23/2022   10:22 AM 05/19/2022  9:50 AM  PHQ 2/9 Scores  PHQ - 2 Score 0 0 0 0 0 0 0  PHQ- 9 Score 1 2 2  0 0 1 0    Fall Risk     07/24/2023    9:53 AM 05/24/2023   10:34 AM 03/12/2023    9:27 AM 12/26/2022    8:31 AM 09/04/2022   10:55 AM  Fall Risk   Falls in the past year? 0 0 0 0 0  Number falls in past yr: 0 0 0  0  Injury with Fall? 0 0 0  0  Risk for fall due to : Impaired balance/gait;Impaired mobility;Orthopedic patient No Fall Risks Impaired balance/gait;Impaired mobility No Fall Risks;Impaired balance/gait   Follow up Falls evaluation completed Falls evaluation completed Falls evaluation completed Falls prevention discussed;Education provided;Falls evaluation completed     MEDICARE RISK AT HOME:  Medicare Risk at Home Any stairs in or around the home?: Yes If so, are there any without handrails?: No Home free of loose throw rugs in walkways, pet beds, electrical cords, etc?: Yes Adequate lighting in your home to reduce risk of falls?: Yes Life alert?: Yes Use of a cane, walker or w/c?: Yes Grab bars in the bathroom?: Yes Shower chair or bench in shower?: Yes Elevated toilet seat or a handicapped toilet?: Yes  TIMED UP AND GO:  Was the test performed?  No  Cognitive Function: 6CIT completed        07/24/2023    9:52 AM 05/19/2022   10:01 AM 05/04/2020   11:14 AM  6CIT Screen  What Year? 0 points 0 points 0 points  What month? 0 points 0 points 0 points  What time? 0 points 0 points 0 points  Count back from 20 0 points 0 points 0 points  Months in reverse 0 points 0 points 0 points  Repeat phrase 0 points 0 points 0 points  Total Score 0 points 0 points 0 points    Immunizations Immunization History  Administered Date(s) Administered   Fluad Quad(high Dose 65+) 10/15/2018, 11/20/2019, 10/17/2022   Influenza-Unspecified 09/27/2017, 10/18/2020,  09/16/2021   Moderna Sars-Covid-2 Vaccination 05/12/2020, 10/18/2020   PFIZER Comirnaty(Gray Top)Covid-19 Tri-Sucrose Vaccine 02/23/2019, 03/26/2019, 10/16/2019   Pneumococcal Conjugate-13 10/30/2013   Pneumococcal Polysaccharide-23 11/22/2000, 11/18/2010   Td 02/18/2001   Zoster Recombinant(Shingrix) 11/20/2019, 09/14/2020   Zoster, Live 06/19/2006    Screening Tests Health Maintenance  Topic Date Due   DTaP/Tdap/Td (2 - Tdap) 12/26/2023 (Originally 02/19/2011)   INFLUENZA VACCINE  08/17/2023   Medicare Annual Wellness (AWV)  07/23/2024   Pneumococcal Vaccine: 50+ Years  Completed   DEXA SCAN  Completed   Zoster Vaccines- Shingrix  Completed   Hepatitis B Vaccines  Aged Out   HPV VACCINES  Aged Out   Meningococcal B Vaccine  Aged Out   COVID-19 Vaccine  Discontinued    Health Maintenance  There are no preventive care reminders to display for this patient. Health Maintenance Items Addressed:   Additional Screening:  Vision Screening: Recommended annual ophthalmology exams for early detection of glaucoma and other disorders of the eye. Would you like a referral to an eye doctor? No    Dental Screening: Recommended annual dental exams for proper oral hygiene  Community Resource Referral / Chronic Care Management: CRR required this visit?  No   CCM required this visit?  No   Plan:    I have personally reviewed and noted the following in the patient's chart:   Medical and social  history Use of alcohol, tobacco or illicit drugs  Current medications and supplements including opioid prescriptions. Patient is not currently taking opioid prescriptions. Functional ability and status Nutritional status Physical activity Advanced directives List of other physicians Hospitalizations, surgeries, and ER visits in previous 12 months Vitals Screenings to include cognitive, depression, and falls Referrals and appointments  In addition, I have reviewed and discussed with  patient certain preventive protocols, quality metrics, and best practice recommendations. A written personalized care plan for preventive services as well as general preventive health recommendations were provided to patient.   Lyle MARLA Right, [place]   07/24/2023   After Visit Summary: (MyChart) Due to this being a telephonic visit, the after visit summary with patients personalized plan was offered to patient via MyChart   Notes: Nothing significant to report at this time.

## 2023-07-24 NOTE — Patient Instructions (Signed)
 Ms. Folmer , Thank you for taking time out of your busy schedule to complete your Annual Wellness Visit with me. I enjoyed our conversation and look forward to speaking with you again next year. I, as well as your care team,  appreciate your ongoing commitment to your health goals. Please review the following plan we discussed and let me know if I can assist you in the future. Your Game plan/ To Do List    Referrals: If you haven't heard from the office you've been referred to, please reach out to them at the phone provided.  none Follow up Visits: Next Medicare AWV with our clinical staff: 07/25/2023   Have you seen your provider in the last 6 months (3 months if uncontrolled diabetes)? No Next Office Visit with your provider: 08/23/2023  Clinician Recommendations:  Aim for 30 minutes of exercise or brisk walking, 6-8 glasses of water, and 5 servings of fruits and vegetables each day.       This is a list of the screening recommended for you and due dates:  Health Maintenance  Topic Date Due   DTaP/Tdap/Td vaccine (2 - Tdap) 12/26/2023*   Flu Shot  08/17/2023   Medicare Annual Wellness Visit  07/23/2024   Pneumococcal Vaccine for age over 9  Completed   DEXA scan (bone density measurement)  Completed   Zoster (Shingles) Vaccine  Completed   Hepatitis B Vaccine  Aged Out   HPV Vaccine  Aged Out   Meningitis B Vaccine  Aged Out   COVID-19 Vaccine  Discontinued  *Topic was postponed. The date shown is not the original due date.    Advanced directives: (Copy Requested) Please bring a copy of your health care power of attorney and living will to the office to be added to your chart at your convenience. You can mail to [facility] [address] [place], [place] [address] or email to [email or web address] Advance Care Planning is important because it:  [x]  Makes sure you receive the medical care that is consistent with your values, goals, and preferences  [x]  It  provides guidance to your family and loved ones and reduces their decisional burden about whether or not they are making the right decisions based on your wishes.  Follow the link provided in your after visit summary or read over the paperwork we have mailed to you to help you started getting your Advance Directives in place. If you need assistance in completing these, please reach out to us  so that we can help you!  See attachments for Preventive Care and Fall Prevention Tips.

## 2023-07-30 ENCOUNTER — Ambulatory Visit: Attending: Cardiology | Admitting: Cardiology

## 2023-07-30 ENCOUNTER — Encounter: Payer: Self-pay | Admitting: Cardiology

## 2023-07-30 VITALS — BP 148/100 | HR 72 | Ht 65.0 in | Wt 294.1 lb

## 2023-07-30 DIAGNOSIS — I10 Essential (primary) hypertension: Secondary | ICD-10-CM | POA: Insufficient documentation

## 2023-07-30 NOTE — Patient Instructions (Signed)

## 2023-07-30 NOTE — Progress Notes (Signed)
 Cardiology Office Note:    Date:  07/30/2023   ID:  Lindsey Jacobs, DOB 1935/02/10, MRN [id number]  PCP:  Vincente Shivers, NP   [facility] Providers Cardiologist:  Redell Cave, MD     Referring MD: Gareth Mliss FALCON, FNP   Chief Complaint  Patient presents with   Follow-up    3 month f/u no complaints today; Meds reviewed verbally with pt.    History of Present Illness:    Lindsey Jacobs is a 88 y.o. female with a hx of hypertension, GERD, obesity who presents for follow-up.  Being seen for hypertension, medications were adjusted.  Currently tolerating Maxzide as prescribed.  Blood pressures at home ranges in the 120s to 130s.  Compliant medications as prescribed.  Endorses eating a low-salt diet.   Cardiac testing Cardiac monitor/2024 no sustained arrhythmias, no A-fib or flutter.  1 nonsustained VT lasting 4 beats.  Past Medical History:  Diagnosis Date   Allergy    Colon polyps    Depression    GERD (gastroesophageal reflux disease)    Glaucoma    History of chicken pox    Hyperlipidemia    Hypertension    Sleep apnea    Urinary incontinence     Past Surgical History:  Procedure Laterality Date   ABDOMINAL HYSTERECTOMY  1984   BREAST EXCISIONAL BIOPSY Right    benign   CARPAL TUNNEL RELEASE Right 1990   CATARACT EXTRACTION, BILATERAL Bilateral 2004   CESAREAN SECTION  1974   CHOLECYSTECTOMY  1974   COLONOSCOPY WITH PROPOFOL  N/A 05/11/2022   Procedure: COLONOSCOPY WITH PROPOFOL ;  Surgeon: Jinny Carmine, MD;  Location: [facility] ENDOSCOPY;  Service: Endoscopy;  Laterality: N/A;   ESOPHAGOGASTRODUODENOSCOPY (EGD) WITH PROPOFOL  N/A 05/20/2019   Procedure: ESOPHAGOGASTRODUODENOSCOPY (EGD) WITH PROPOFOL ;  Surgeon: Janalyn Keene NOVAK, MD;  Location: [facility] ENDOSCOPY;  Service: Endoscopy;  Laterality: N/A;   EYE SURGERY     JOINT REPLACEMENT     REPLACEMENT TOTAL KNEE Left 2007   REPLACEMENT TOTAL KNEE BILATERAL Right 2006   VARICOSE VEIN SURGERY Left 1965     Current Medications: Current Meds  Medication Sig   Carboxymeth-Glyc-Polysorb PF (REFRESH DIGITAL PF) 0.5-1-0.5 % SOLN Apply to eye.   Cholecalciferol (VITAMIN D3 MAXIMUM STRENGTH PO) Take by mouth.   cycloSPORINE (RESTASIS) 0.05 % ophthalmic emulsion SMARTSIG:In Eye(s) (Patient taking differently: at bedtime.)   DULoxetine  (CYMBALTA ) 60 MG capsule Take 60 mg by mouth daily.   fluticasone  (FLONASE ) 50 MCG/ACT nasal spray Place 2 sprays into both nostrils daily. (Patient taking differently: Place 2 sprays into both nostrils as needed.)   Latanoprostene Bunod (VYZULTA) 0.024 % SOLN INSTILL 1 DROP IN BOTH EYES DAILY IN THE EVENING   Magnesium Oxide 500 MG TABS Take by mouth. Patient is taking 2 tablet once daily.   Multiple Vitamins-Minerals (ADVANCED EYE HEALTH PO) Take by mouth.   naproxen  (NAPROSYN ) 500 MG tablet Take 1 tablet (500 mg total) by mouth daily as needed for moderate pain (pain score 4-6). Take with meal   pantoprazole  (PROTONIX ) 40 MG tablet Take 1 tablet (40 mg total) by mouth daily.   triamterene -hydrochlorothiazide (MAXZIDE-25) 37.5-25 MG tablet Take 1 tablet by mouth daily.     Allergies:   Amoxapine and related, Amoxicillin, Diamox [acetazolamide], Keflex  [cephalexin ], and Tape   Social History   Socioeconomic History   Marital status: Widowed    Spouse name: Not on file   Number of children: Not on file   Years of education: Not on file  Highest education level: Associate degree: academic program  Occupational History   Occupation: retired  Tobacco Use   Smoking status: Never    Passive exposure: Never   Smokeless tobacco: Never  Vaping Use   Vaping status: Never Used  Substance and Sexual Activity   Alcohol use: Never   Drug use: Never   Sexual activity: Not Currently  Other Topics Concern   Not on file  Social History Narrative   Not on file   Social Drivers of Health   Financial Resource Strain: Low Risk  (07/24/2023)   Overall Financial  Resource Strain (CARDIA)    Difficulty of Paying Living Expenses: Not hard at all  Food Insecurity: No Food Insecurity (07/24/2023)   Hunger Vital Sign    Worried About Running Out of Food in the Last Year: Never true    Ran Out of Food in the Last Year: Never true  Transportation Needs: No Transportation Needs (07/24/2023)   PRAPARE - Administrator, Civil Service (Medical): No    Lack of Transportation (Non-Medical): No  Physical Activity: Inactive (07/24/2023)   Exercise Vital Sign    Days of Exercise per Week: 0 days    Minutes of Exercise per Session: 0 min  Stress: No Stress Concern Present (07/24/2023)   [employer] of Occupational Health - Occupational Stress Questionnaire    Feeling of Stress: Not at all  Social Connections: Moderately Integrated (07/24/2023)   Social Connection and Isolation Panel    Frequency of Communication with Friends and Family: More than three times a week    Frequency of Social Gatherings with Friends and Family: Once a week    Attends Religious Services: More than 4 times per year    Active Member of [employer] or Organizations: Yes    Attends Banker Meetings: More than 4 times per year    Marital Status: Widowed     Family History: The patient's family history includes Alcohol abuse in her father; Colon polyps in her father; Heart disease in her father. There is no history of Breast cancer.  ROS:   Please see the history of present illness.     All other systems reviewed and are negative.  EKGs/Labs/Other Studies Reviewed:    The following studies were reviewed today:       Recent Labs: 02/23/2023: ALT 8; Hemoglobin 13.4; Platelets 232 03/29/2023: BUN 15; Creatinine, Ser 1.25; Potassium 4.4; Sodium 141  Recent Lipid Panel    Component Value Date/Time   CHOL 184 06/23/2022 1001   TRIG 98 06/23/2022 1001   HDL 66 06/23/2022 1001   CHOLHDL 2.8 06/23/2022 1001   LDLCALC 99 06/23/2022 1001     Risk  Assessment/Calculations:          Physical Exam:    VS:  BP (!) 148/100 (BP Location: Left Arm, Patient Position: Sitting, Cuff Size: Large)   Pulse 72   Ht 5' 5 (1.651 m)   Wt 294 lb 2 oz (133.4 kg)   LMP  (LMP Unknown)   SpO2 97%   BMI 48.94 kg/m     Wt Readings from Last 3 Encounters:  07/30/23 294 lb 2 oz (133.4 kg)  07/24/23 297 lb (134.7 kg)  05/24/23 299 lb (135.6 kg)     GEN:  Well nourished, well developed in no acute distress HEENT: Normal NECK: No JVD; No carotid bruits CARDIAC: RRR, no murmurs, rubs, gallops RESPIRATORY:  Clear to auscultation without rales, wheezing or rhonchi  ABDOMEN:  Soft, non-tender, non-distended MUSCULOSKELETAL:  No edema; No deformity  SKIN: Warm and dry NEUROLOGIC:  Alert and oriented x 3 PSYCHIATRIC:  Normal affect   ASSESSMENT:    1. White coat syndrome with diagnosis of hypertension   2. Morbid obesity (HCC)    PLAN:    In order of problems listed above:  Hypertension, BP elevated today, controlled at home.  Has a component of whitecoat syndrome.  Continue HCTZ-triamterene  25-37.5 mg daily.  Did not tolerate Benicar .  Morbid obesity, low-calorie diet, weight loss advised,  Follow-up in 6-12 months.     Medication Adjustments/Labs and Tests Ordered: Current medicines are reviewed at length with the patient today.  Concerns regarding medicines are outlined above.  No orders of the defined types were placed in this encounter.  No orders of the defined types were placed in this encounter.   Patient Instructions  Medication Instructions:  Your physician recommends that you continue on your current medications as directed. Please refer to the Current Medication list given to you today.    *If you need a refill on your cardiac medications before your next appointment, please call your pharmacy*  Lab Work: No labs ordered today  If you have labs (blood work) drawn today and your tests are completely normal, you will  receive your results only by: MyChart Message (if you have MyChart) OR A paper copy in the mail If you have any lab test that is abnormal or we need to change your treatment, we will call you to review the results.  Testing/Procedures: No test ordered today   Follow-Up: At [facility], you and your health needs are our priority.  As part of our continuing mission to provide you with exceptional heart care, our providers are all part of one team.  This team includes your primary Cardiologist (physician) and Advanced Practice Providers or APPs (Physician Assistants and Nurse Practitioners) who all work together to provide you with the care you need, when you need it.  Your next appointment:   1 year(s)  Provider:   You may see Redell Cave, MD or one of the following Advanced Practice Providers on your designated Care Team:   Lonni Meager, NP Lesley Maffucci, PA-C Bernardino Bring, PA-C Cadence Arroyo Hondo, PA-C Tylene Lunch, NP Barnie Hila, NP    We recommend signing up for the patient portal called MyChart.  Sign up information is provided on this After Visit Summary.  MyChart is used to connect with patients for Virtual Visits (Telemedicine).  Patients are able to view lab/test results, encounter notes, upcoming appointments, etc.  Non-urgent messages can be sent to your provider as well.   To learn more about what you can do with MyChart, go to [email or web address].      Signed, Redell Cave, MD  07/30/2023 10:08 AM    [facility]

## 2023-08-02 DIAGNOSIS — M19079 Primary osteoarthritis, unspecified ankle and foot: Secondary | ICD-10-CM | POA: Insufficient documentation

## 2023-08-10 ENCOUNTER — Telehealth: Payer: Self-pay | Admitting: *Deleted

## 2023-08-10 NOTE — Telephone Encounter (Signed)
 Copied from CRM [id number]. Topic: General - Other >> Aug 10, 2023  1:52 PM Deaijah H wrote: Reason for CRM: Patient would like to let Dr. Vincente Possible UTI and would like to know if she can bring a urine sample by stated she can hardly walk. Please call [phone]

## 2023-08-10 NOTE — Telephone Encounter (Signed)
 I spoke with pt;   pt said for a few days pt has noticed cloudy urine. ? Fever, pt said she feels cold sometimes. Pt does not have burning or pain upon urination and no abd pain., pt does have lower lt back pain. Pt has no hx of kidney stones. No available appts at [facility] or [facility] this afternoon. Pt prefers appt. I scheduled appt at [facility] 08/11/23 at 12:45 PM with UC & ED precautions and pt voiced understanding. Pt is drinking a lot of water also. Sending FYI to Adina Crandall NP.

## 2023-08-11 ENCOUNTER — Ambulatory Visit
Admission: RE | Admit: 2023-08-11 | Discharge: 2023-08-11 | Disposition: A | Discharge status: Home / Self Care | Payer: Self-pay | Attending: Physician Assistant | Admitting: Physician Assistant

## 2023-08-11 VITALS — BP 161/80 | HR 72 | Temp 98.0°F | Resp 18

## 2023-08-11 DIAGNOSIS — R8290 Unspecified abnormal findings in urine: Secondary | ICD-10-CM | POA: Insufficient documentation

## 2023-08-11 DIAGNOSIS — I10 Essential (primary) hypertension: Secondary | ICD-10-CM | POA: Insufficient documentation

## 2023-08-11 DIAGNOSIS — R300 Dysuria: Secondary | ICD-10-CM | POA: Diagnosis present

## 2023-08-11 LAB — POCT URINALYSIS DIP (MANUAL ENTRY)
Bilirubin, UA: NEGATIVE
Blood, UA: NEGATIVE
Glucose, UA: NEGATIVE mg/dL
Ketones, POC UA: NEGATIVE mg/dL
Leukocytes, UA: NEGATIVE
Nitrite, UA: NEGATIVE
Protein Ur, POC: 30 mg/dL — AB
Spec Grav, UA: 1.015 (ref 1.010–1.025)
Urobilinogen, UA: 0.2 U/dL
pH, UA: 7.5 (ref 5.0–8.0)

## 2023-08-11 NOTE — ED Triage Notes (Signed)
 Patient to Urgent Care with complaints of lower (left sided) back pain/ cloudy and malodorous urine/ chills.  Symptoms x1 week. Reports hx of UTI's and this feels similar to how they have presented in the past.

## 2023-08-11 NOTE — Discharge Instructions (Signed)
 Your urine was normal in clinic.  We will send this for culture and contact you if we need to start any antibiotics.  Use your over-the-counter medication such as Tylenol for pain relief.  If you develop any fever, nausea, vomiting, blood in your urine, severe abdominal pain you should be reevaluated immediately.  Your blood pressure was elevated.  Monitor this at home.  Take your medication as prescribed.  If you develop any chest pain, shortness of breath, headache, vision change, dizziness in the setting of high blood pressure you need to go to the ER.

## 2023-08-11 NOTE — ED Provider Notes (Signed)
 [facility]    CSN: [id number] Arrival date & time: 08/11/23  1149      History   Chief Complaint Chief Complaint  Patient presents with   Back Pain    pts urine is cloudy - Entered by patient    HPI Lindsey Jacobs is a 88 y.o. female.   Patient presents today accompanied by her daughter who provides majority of history.  She reports that for the past few days that she has had a small amount of burning sensation with urination as well as an odor to her urine.  She denies any known frequency or urgency but wears an incontinence garment and so would not know that she would notice this.  Denies any associated hematuria, fever, nausea, vomiting.  She did have some chills the other day and so contacted her primary care who scheduled an appointment with our clinic.  She does have a history of UTI but has not been treated in the past 90 days with any antibiotics.  Denies any recent urogenital procedure, self-catheterization, history of nephrolithiasis.  She does not take an SGLT2 inhibitor.  She does report some mild back pain which is rated 4 on a 0-10 pain scale, described as aching, localized to her cervical and lumbar regions, no alleviating factors identified.  She has not been taking any over-the-counter medications for this as the pain has been tolerable.  She does have a history of degenerative disc disease and is followed by pain management where she receives intra facet injections and is scheduled to have her next procedure in a few weeks.    Past Medical History:  Diagnosis Date   Allergy    Colon polyps    Depression    GERD (gastroesophageal reflux disease)    Glaucoma    History of chicken pox    Hyperlipidemia    Hypertension    Sleep apnea    Urinary incontinence     Patient Active Problem List   Diagnosis Date Noted   Aortic atherosclerosis (HCC) 03/12/2023   Functional urinary incontinence 07/28/2022   Establishing care with new doctor, encounter for  07/28/2022   Cervical radiculitis 02/15/2021   CKD (chronic kidney disease) stage 3, GFR 30-59 ml/min (HCC) 09/14/2020   HLD (hyperlipidemia) 09/14/2020   Class 3 severe obesity due to excess calories with body mass index (BMI) of 45.0 to 49.9 in adult 09/14/2020   Vertigo 08/18/2019   Prediabetes 05/14/2019   AMD (age-related macular degeneration), bilateral 04/26/2018   Gastroesophageal reflux disease without esophagitis 02/12/2018   Essential hypertension 01/02/2018   Osteoarthritis 01/02/2018   Obstructive sleep apnea syndrome 01/02/2018    Past Surgical History:  Procedure Laterality Date   ABDOMINAL HYSTERECTOMY  1984   BREAST EXCISIONAL BIOPSY Right    benign   CARPAL TUNNEL RELEASE Right 1990   CATARACT EXTRACTION, BILATERAL Bilateral 2004   CESAREAN SECTION  1974   CHOLECYSTECTOMY  1974   COLONOSCOPY WITH PROPOFOL  N/A 05/11/2022   Procedure: COLONOSCOPY WITH PROPOFOL ;  Surgeon: Jinny Carmine, MD;  Location: [facility] ENDOSCOPY;  Service: Endoscopy;  Laterality: N/A;   ESOPHAGOGASTRODUODENOSCOPY (EGD) WITH PROPOFOL  N/A 05/20/2019   Procedure: ESOPHAGOGASTRODUODENOSCOPY (EGD) WITH PROPOFOL ;  Surgeon: Janalyn Keene NOVAK, MD;  Location: [facility] ENDOSCOPY;  Service: Endoscopy;  Laterality: N/A;   EYE SURGERY     JOINT REPLACEMENT     REPLACEMENT TOTAL KNEE Left 2007   REPLACEMENT TOTAL KNEE BILATERAL Right 2006   VARICOSE VEIN SURGERY Left 1965    OB  History   No obstetric history on file.      Home Medications    Prior to Admission medications   Medication Sig Start Date End Date Taking? Authorizing Provider  Carboxymeth-Glyc-Polysorb PF (REFRESH DIGITAL PF) 0.5-1-0.5 % SOLN Apply to eye.    [provider]  cetirizine  (ZYRTEC ) 10 MG tablet Take 1 tablet (10 mg total) by mouth daily. Patient not taking: Reported on 07/30/2023 03/12/23   Gareth Mliss FALCON, FNP  Cholecalciferol (VITAMIN D3 MAXIMUM STRENGTH PO) Take by mouth.    [provider]  cycloSPORINE  (RESTASIS) 0.05 % ophthalmic emulsion SMARTSIG:In Eye(s) Patient taking differently: at bedtime. 05/23/21   [provider]  DULoxetine  (CYMBALTA ) 60 MG capsule Take 60 mg by mouth daily. 05/23/23   [provider]  fluticasone  (FLONASE ) 50 MCG/ACT nasal spray Place 2 sprays into both nostrils daily. Patient taking differently: Place 2 sprays into both nostrils as needed. 01/02/23   Gareth Mliss FALCON, FNP  Latanoprostene Bunod (VYZULTA) 0.024 % SOLN INSTILL 1 DROP IN BOTH EYES DAILY IN THE EVENING    [provider]  Magnesium Oxide 500 MG TABS Take by mouth. Patient is taking 2 tablet once daily.    [provider]  Multiple Vitamins-Minerals (ADVANCED EYE HEALTH PO) Take by mouth.    [provider]  naproxen  (NAPROSYN ) 500 MG tablet Take 1 tablet (500 mg total) by mouth daily as needed for moderate pain (pain score 4-6). Take with meal 05/24/23 08/22/23  Vincente Shivers, NP  pantoprazole  (PROTONIX ) 40 MG tablet Take 1 tablet (40 mg total) by mouth daily. 03/12/23   Gareth Mliss FALCON, FNP  triamterene -hydrochlorothiazide (MAXZIDE-25) 37.5-25 MG tablet Take 1 tablet by mouth daily. 03/19/23   Darliss Rogue, MD    Family History Family History  Problem Relation Age of Onset   Heart disease Father    Colon polyps Father    Alcohol abuse Father    Breast cancer Neg Hx     Social History Social History   Tobacco Use   Smoking status: Never    Passive exposure: Never   Smokeless tobacco: Never  Vaping Use   Vaping status: Never Used  Substance Use Topics   Alcohol use: Never   Drug use: Never     Allergies   Amoxapine and related, Amoxicillin, Diamox [acetazolamide], Keflex  [cephalexin ], and Tape   Review of Systems Review of Systems  Constitutional:  Negative for activity change, appetite change, fatigue and fever.  Gastrointestinal:  Negative for abdominal pain, diarrhea, nausea and vomiting.  Genitourinary:  Positive for dysuria. Negative  for frequency, hematuria, pelvic pain, urgency, vaginal bleeding, vaginal discharge and vaginal pain.  Musculoskeletal:  Positive for back pain. Negative for arthralgias and myalgias.  Skin:  Negative for rash.     Physical Exam Triage Vital Signs ED Triage Vitals  Encounter Vitals Group     BP 08/11/23 1211 (!) 178/89     Girls Systolic BP Percentile --      Girls Diastolic BP Percentile --      Boys Systolic BP Percentile --      Boys Diastolic BP Percentile --      Pulse Rate 08/11/23 1211 72     Resp 08/11/23 1211 18     Temp 08/11/23 1211 98 F (36.7 C)     Temp src --      SpO2 08/11/23 1211 96 %     Weight --      Height --  Head Circumference --      Peak Flow --      Pain Score 08/11/23 1156 0     Pain Loc --      Pain Education --      Exclude from Growth Chart --    No data found.  Updated Vital Signs BP (!) 161/80   Pulse 72   Temp 98 F (36.7 C)   Resp 18   LMP  (LMP Unknown)   SpO2 96%   Visual Acuity Right Eye Distance:   Left Eye Distance:   Bilateral Distance:    Right Eye Near:   Left Eye Near:    Bilateral Near:     Physical Exam Vitals reviewed.  Constitutional:      General: She is awake. She is not in acute distress.    Appearance: Normal appearance. She is well-developed. She is not ill-appearing.     Comments: Very pleasant female appears stated age in no acute distress sitting comfortably in wheelchair in exam room  HENT:     Head: Normocephalic and atraumatic.  Cardiovascular:     Rate and Rhythm: Normal rate and regular rhythm.     Heart sounds: Normal heart sounds, S1 normal and S2 normal. No murmur heard. Pulmonary:     Effort: Pulmonary effort is normal.     Breath sounds: Normal breath sounds. No wheezing, rhonchi or rales.     Comments: Clear to auscultation bilaterally Abdominal:     General: Bowel sounds are normal.     Palpations: Abdomen is soft.     Tenderness: There is no abdominal tenderness. There is no  right CVA tenderness, left CVA tenderness, guarding or rebound.     Comments: Benign abdominal exam  Musculoskeletal:     Cervical back: No tenderness or bony tenderness.     Thoracic back: Tenderness present. No bony tenderness.     Lumbar back: Tenderness present. No bony tenderness.     Comments: Mild tenderness palpation throughout thoracic and lumbar paraspinal muscles.  No deformity or step-off noted.  Psychiatric:        Behavior: Behavior is cooperative.      UC Treatments / Results  Labs (all labs ordered are listed, but only abnormal results are displayed) Labs Reviewed  POCT URINALYSIS DIP (MANUAL ENTRY) - Abnormal; Notable for the following components:      Result Value   Clarity, UA turbid (*)    Protein Ur, POC =30 (*)    All other components within normal limits  URINE CULTURE    EKG   Radiology No results found.  Procedures Procedures (including critical care time)  Medications Ordered in UC Medications - No data to display  Initial Impression / Assessment and Plan / UC Course  I have reviewed the triage vital signs and the nursing notes.  Pertinent labs & imaging results that were available during my care of the patient were reviewed by me and considered in my medical decision making (see chart for details).     Patient is well-appearing, afebrile, nontoxic, nontachycardic.  Vital signs and physical exam are reassuring with no indication for emergent evaluation or imaging.  Urine was not convincing for UTI.  Given she has a history of recurrent UTI and does report some mild dysuria we will send this for culture but defer antibiotics until culture results are available.  Recommend that she push fluids.  We did discuss that her back pain is likely related to history of degenerative  disc disease and she is to continue the medication such as Tylenol that has previously been prescribed by her primary care.  I did offer a prescription for lidocaine  patches but  she did not think this was necessary as she has not even tried any of her normal medication to manage her pain.  We discussed that if anything worsens and she has increasing pain, fever, nausea, vomiting, abdominal pain, hematuria, worsening symptoms that she needs to be seen immediately.  Strict return precautions given.  All questions answered to patient and daughter satisfaction.  Her blood pressure is elevated today.  Reports compliance with her medication.  She does check her blood pressure this morning (checks it daily) and it was 136/70.  She denies any chest pain, shortness of breath, headache, vision change, dizziness.  Recommended she continue to monitor this and follow-up with her primary care as scheduled in a few weeks.  We discussed that if she develops any chest pain, shortness of breath, headache, vision change, dizziness in setting of high blood pressure she needs to be seen emergently to which she expressed understanding.  Final Clinical Impressions(s) / UC Diagnoses   Final diagnoses:  Malodorous urine  Dysuria  Elevated blood pressure reading with diagnosis of hypertension     Discharge Instructions      Your urine was normal in clinic.  We will send this for culture and contact you if we need to start any antibiotics.  Use your over-the-counter medication such as Tylenol for pain relief.  If you develop any fever, nausea, vomiting, blood in your urine, severe abdominal pain you should be reevaluated immediately.  Your blood pressure was elevated.  Monitor this at home.  Take your medication as prescribed.  If you develop any chest pain, shortness of breath, headache, vision change, dizziness in the setting of high blood pressure you need to go to the ER.     ED Prescriptions   None    PDMP not reviewed this encounter.   Sherrell Rocky POUR, PA-C 08/11/23 1241

## 2023-08-13 ENCOUNTER — Ambulatory Visit (HOSPITAL_COMMUNITY): Payer: Self-pay

## 2023-08-13 LAB — URINE CULTURE: Culture: 70000 — AB

## 2023-08-13 MED ORDER — CEFPODOXIME PROXETIL 100 MG PO TABS: 100.0000 mg | ORAL_TABLET | Freq: Two times a day (BID) | ORAL | 0 refills | Status: AC

## 2023-08-13 NOTE — Telephone Encounter (Signed)
 I see that she was seen at the UC. Agree with the immediate evaluation

## 2023-08-13 NOTE — Telephone Encounter (Signed)
 Please advise patient to begin Vantin  1 tablet twice daily for the next 7 days.  Prescription sent to pharmacy.

## 2023-08-15 ENCOUNTER — Telehealth: Payer: Self-pay

## 2023-08-15 NOTE — Telephone Encounter (Signed)
 I spoke with pt and she said the pharmacy got the Vantin  in and pts daughter picked up the med on 08/14/23. Nothing further needed at this time.

## 2023-08-15 NOTE — Telephone Encounter (Signed)
 Copied from CRM #[id number]. Topic: Clinical - Prescription Issue >> Aug 14, 2023 11:18 AM Franky GRADE wrote: Reason for CRM: Patient is calling because her local pharmacy is out of stock for Vantin  and will not receive it for the next 3-4 days, She would like to know if it can be sent to [id number] [place], [place] - [address] AT [facility] OF [address]  Phone: [phone] Fax: [phone].

## 2023-08-23 ENCOUNTER — Other Ambulatory Visit: Payer: Self-pay | Admitting: General Practice

## 2023-08-23 ENCOUNTER — Ambulatory Visit (INDEPENDENT_AMBULATORY_CARE_PROVIDER_SITE_OTHER): Admitting: General Practice

## 2023-08-23 ENCOUNTER — Encounter: Payer: Self-pay | Admitting: General Practice

## 2023-08-23 VITALS — BP 122/74 | HR 67 | Temp 98.6°F | Ht 65.0 in | Wt 288.0 lb

## 2023-08-23 DIAGNOSIS — Z6841 Body Mass Index (BMI) 40.0 and over, adult: Secondary | ICD-10-CM | POA: Diagnosis not present

## 2023-08-23 DIAGNOSIS — E66813 Obesity, class 3: Secondary | ICD-10-CM

## 2023-08-23 DIAGNOSIS — E7800 Pure hypercholesterolemia, unspecified: Secondary | ICD-10-CM

## 2023-08-23 DIAGNOSIS — R7303 Prediabetes: Secondary | ICD-10-CM

## 2023-08-23 DIAGNOSIS — I10 Essential (primary) hypertension: Secondary | ICD-10-CM | POA: Diagnosis not present

## 2023-08-23 DIAGNOSIS — N1831 Chronic kidney disease, stage 3a: Secondary | ICD-10-CM

## 2023-08-23 DIAGNOSIS — M19019 Primary osteoarthritis, unspecified shoulder: Secondary | ICD-10-CM

## 2023-08-23 DIAGNOSIS — I700 Atherosclerosis of aorta: Secondary | ICD-10-CM

## 2023-08-23 DIAGNOSIS — M150 Primary generalized (osteo)arthritis: Secondary | ICD-10-CM

## 2023-08-23 LAB — LIPID PANEL
Cholesterol: 180 mg/dL (ref 0–200)
HDL: 59.5 mg/dL (ref >39.00)
LDL Cholesterol: 98 mg/dL (ref 0–99)
NonHDL: 120.99
Total CHOL/HDL Ratio: 3
Triglycerides: 117 mg/dL (ref 0.0–149.0)
VLDL: 23.4 mg/dL (ref 0.0–40.0)

## 2023-08-23 LAB — BASIC METABOLIC PANEL WITH GFR
BUN: 13 mg/dL (ref 6–23)
CO2: 26 meq/L (ref 19–32)
Calcium: 10 mg/dL (ref 8.4–10.5)
Chloride: 102 meq/L (ref 96–112)
Creatinine, Ser: 0.99 mg/dL (ref 0.40–1.20)
GFR: 51.74 mL/min — ABNORMAL LOW (ref >60.00)
Glucose, Bld: 101 mg/dL — ABNORMAL HIGH (ref 70–99)
Potassium: 4.2 meq/L (ref 3.5–5.1)
Sodium: 143 meq/L (ref 135–145)

## 2023-08-23 LAB — TSH: TSH: 2.41 u[IU]/mL (ref 0.35–5.50)

## 2023-08-23 LAB — MAGNESIUM: Magnesium: 2.1 mg/dL (ref 1.5–2.5)

## 2023-08-23 LAB — HEMOGLOBIN A1C: Hgb A1c MFr Bld: 5.5 % (ref 4.6–6.5)

## 2023-08-23 NOTE — Assessment & Plan Note (Signed)
 Controlled.   Followed by cardiology. Reviewed notes from July, 2025.   Continue Triamterene -hydrochlorothiazide 37.5-25mg  once daily. Will update me when she needs refill.   F/u in 3-6 months.

## 2023-08-23 NOTE — Assessment & Plan Note (Signed)
 Chronic and stable.  Reviewed labs from 2020-2025.  BMP pending.  Discussed to avoid NSAIDs.

## 2023-08-23 NOTE — Assessment & Plan Note (Signed)
 Controlled.  Following with ortho.  Does get injections as needed.

## 2023-08-23 NOTE — Assessment & Plan Note (Signed)
 Chronic.  Hemoglobin A1c pending.

## 2023-08-23 NOTE — Progress Notes (Signed)
 Established Patient Office Visit  Subjective   Patient ID: Lindsey Jacobs, female    DOB: [birth year]  Age: 88 y.o. MRN: [id number]  Chief Complaint  Patient presents with   Medical Management of Chronic Issues    Hypertension     HPI  Jaira Grunden is a 88 year old female with past medical history of HTN, prediabetes, OA, HLD, CKD presents today for a three month follow up.   Her daughter is also present today.  HTN: Currently managed on Maxine 37.5-25 mg once daily. The home BP readings have been in the 120's-140's / 60's-70's range. Denies any headaches, chest pain, shortness of breath or difficulty breathing.   Prediabetes: diagnosed in 2021. Has been monitoring her diet. Hemoglobin A1c was 2024 was 5.6.  HLD: chronic. Has not been on any medications. Does monitor her diet. Exercise is limited due to her arthritis.   OA: severe swelling and pain in the ankles. She usually uses a walker at home but it has been very painful to walk.  Scheduled to see ortho this afternoon for injection in her ankles.  Patient Active Problem List   Diagnosis Date Noted   Primary osteoarthritis of ankle 08/02/2023   Aortic atherosclerosis (HCC) 03/12/2023   Pain of cervical spine 02/15/2023   Localized, primary osteoarthritis of shoulder region 02/15/2023   Functional urinary incontinence 07/28/2022   Cervical radiculitis 02/15/2021   CKD (chronic kidney disease) stage 3, GFR 30-59 ml/min (HCC) 09/14/2020   HLD (hyperlipidemia) 09/14/2020   Class 3 severe obesity due to excess calories with body mass index (BMI) of 45.0 to 49.9 in adult 09/14/2020   Vertigo 08/18/2019   Prediabetes 05/14/2019   AMD (age-related macular degeneration), bilateral 04/26/2018   Gastroesophageal reflux disease without esophagitis 02/12/2018   Essential hypertension 01/02/2018   Osteoarthritis 01/02/2018   Obstructive sleep apnea syndrome 01/02/2018   Past Medical History:  Diagnosis Date   Allergy    Colon  polyps    Depression    GERD (gastroesophageal reflux disease)    Glaucoma    History of chicken pox    Hyperlipidemia    Hypertension    Sleep apnea    Urinary incontinence    Past Surgical History:  Procedure Laterality Date   ABDOMINAL HYSTERECTOMY  1984   BREAST EXCISIONAL BIOPSY Right    benign   CARPAL TUNNEL RELEASE Right 1990   CATARACT EXTRACTION, BILATERAL Bilateral 2004   CESAREAN SECTION  1974   CHOLECYSTECTOMY  1974   COLONOSCOPY WITH PROPOFOL  N/A 05/11/2022   Procedure: COLONOSCOPY WITH PROPOFOL ;  Surgeon: Jinny Carmine, MD;  Location: [facility] END[facility]COPY;  Service: Endoscopy;  Laterality: N/A;   ESOPHAGOGASTRODUODENOSCOPY (EGD) WITH PROPOFOL  N/A 05/20/2019   Procedure: ESOPHAGOGASTRODUODENOSCOPY (EGD) WITH PROPOFOL ;  Surgeon: Janalyn Keene NOVAK, MD;  Location: [facility] ENDOSCOPY;  Service: Endoscopy;  Laterality: N/A;   EYE SURGERY     JOINT REPLACEMENT     REPLACEMENT TOTAL KNEE Left 2007   REPLACEMENT TOTAL KNEE BILATERAL Right 2006   VARICOSE VEIN SURGERY Left 1965   Allergies  Allergen Reactions   Amoxapine And Related     Hives   Amoxicillin Hives   Diamox [Acetazolamide]    Keflex  [Cephalexin ] Rash   Tape Rash         08/23/2023    8:31 AM 07/24/2023    9:58 AM 05/24/2023   10:34 AM  Depression screen PHQ 2/9  Decreased Interest 0 0 0  Down, Depressed, Hopeless 0 0 0  PHQ -  2 Score 0 0 0  Altered sleeping 2 1 1   Tired, decreased energy 0 0 0  Change in appetite 0 0 0  Feeling bad or failure about yourself  0 0 0  Trouble concentrating 1 0 1  Moving slowly or fidgety/restless 0 0 0  Suicidal thoughts 0 0 0  PHQ-9 Score 3 1 2   Difficult doing work/chores Not difficult at all Not difficult at all Not difficult at all       08/23/2023    8:32 AM 05/24/2023   10:34 AM 12/26/2022    8:33 AM 06/23/2022   10:22 AM  GAD 7 : Generalized Anxiety Score  Nervous, Anxious, on Edge 0 0 0 0  Control/stop worrying 0 0 0 0  Worry too much - different things 0 0 0 0   Trouble relaxing  0 0 0  Restless 0 0 0 0  Easily annoyed or irritable 0 0 0 0  Afraid - awful might happen 0 0 0 0  Total GAD 7 Score  0 0 0  Anxiety Difficulty Not difficult at all Not difficult at all  Not difficult at all      Review of Systems  Constitutional:  Negative for chills and fever.  Respiratory:  Negative for shortness of breath.   Cardiovascular:  Positive for leg swelling. Negative for chest pain.  Gastrointestinal:  Negative for abdominal pain, constipation, diarrhea, heartburn, nausea and vomiting.  Genitourinary:  Negative for dysuria, frequency and urgency.  Neurological:  Negative for dizziness and headaches.  Endo/Heme/Allergies:  Negative for polydipsia.  Psychiatric/Behavioral:  Negative for depression and suicidal ideas. The patient is not nervous/anxious.       Objective:     BP 122/74   Pulse 67   Temp 98.6 F (37 C) (Oral)   Ht 5' 5 (1.651 m)   Wt 288 lb (130.6 kg)   LMP  (LMP Unknown)   SpO2 98%   BMI 47.93 kg/m  BP Readings from Last 3 Encounters:  08/23/23 122/74  08/11/23 (!) 161/80  07/30/23 (!) 148/100   Wt Readings from Last 3 Encounters:  08/23/23 288 lb (130.6 kg)  07/30/23 294 lb 2 oz (133.4 kg)  07/24/23 297 lb (134.7 kg)      Physical Exam Vitals and nursing note reviewed.  Constitutional:      Appearance: Normal appearance.  Cardiovascular:     Rate and Rhythm: Normal rate and regular rhythm.     Pulses: Normal pulses.     Heart sounds: Normal heart sounds.  Pulmonary:     Effort: Pulmonary effort is normal.     Breath sounds: Normal breath sounds.  Musculoskeletal:     Right ankle: Swelling present.     Left ankle: Swelling present.  Neurological:     Mental Status: She is alert and oriented to person, place, and time.  Psychiatric:        Mood and Affect: Mood normal.        Behavior: Behavior normal.        Thought Content: Thought content normal.        Judgment: Judgment normal.      No results  found for any visits on 08/23/23.     The ASCVD Risk score (Arnett DK, et al., 2019) failed to calculate for the following reasons:   The 2019 ASCVD risk score is only valid for ages 42 to 14    Assessment & Plan:  Essential hypertension Assessment & Plan: Controlled.  Followed by cardiology. Reviewed notes from July, 2025.   Continue Triamterene -hydrochlorothiazide 37.5-25mg  once daily. Will update me when she needs refill.   F/u in 3-6 months.  Orders: -     Basic metabolic panel with GFR -     Lipid panel -     Magnesium  Class 3 severe obesity due to excess calories with body mass index (BMI) of 45.0 to 49.9 in adult Assessment & Plan: Discussed the importance of healthy diet and exercise to affect sustainable weight loss.   Orders: -     Basic metabolic panel with GFR -     Lipid panel -     Hemoglobin A1c -     TSH -     Magnesium  Stage 3a chronic kidney disease (HCC) Assessment & Plan: Chronic and stable.  Reviewed labs from 2020-2025.  BMP pending.  Discussed to avoid NSAIDs.    Pure hypercholesterolemia Assessment & Plan: Lipid panel pending.  Orders: -     TSH -     Magnesium  Prediabetes Assessment & Plan: Chronic.  Hemoglobin A1c pending.   Orders: -     Hemoglobin A1c -     Magnesium  Localized, primary osteoarthritis of shoulder region, unspecified laterality Assessment & Plan: Controlled.  Following with ortho.  Does get injections as needed.   Primary osteoarthritis involving multiple joints Assessment & Plan: Chronic.  Multiple joints including shoulders, ankles and spine.   Currently managed on Naproxen  500 mg once daily as needed. Discussed to avoid using NSAIDs due to decreased kidney functions.  Start tylenol arthritis 500 mg once daily as needed. Educated to not exceed more than 4000 mg in 24 hours.   Following with ortho.  Plans to get injection in her ankles later today.  F/u in 3 months.   Aortic  atherosclerosis (HCC) Assessment & Plan: Lipid panel pending today.  Followed by cardiology.     Return in about 3 months (around 11/23/2023) for chroni care management.    Carrol Aurora, NP

## 2023-08-23 NOTE — Assessment & Plan Note (Signed)
 Discussed the importance of healthy diet and exercise to affect sustainable weight loss.

## 2023-08-23 NOTE — Assessment & Plan Note (Signed)
 Chronic.  Multiple joints including shoulders, ankles and spine.   Currently managed on Naproxen  500 mg once daily as needed. Discussed to avoid using NSAIDs due to decreased kidney functions.  Start tylenol arthritis 500 mg once daily as needed. Educated to not exceed more than 4000 mg in 24 hours.   Following with ortho.  Plans to get injection in her ankles later today.  F/u in 3 months.

## 2023-08-23 NOTE — Assessment & Plan Note (Signed)
 Lipid panel pending.

## 2023-08-23 NOTE — Assessment & Plan Note (Signed)
 Lipid panel pending today.  Followed by cardiology.

## 2023-08-23 NOTE — Patient Instructions (Signed)
 Stop by the lab prior to leaving today. I will notify you of your results once received.   Continue Maxide as prescribed. Let me know when you need a refill.   Follow up in 3 months.   It was a pleasure to see you today!

## 2023-08-24 ENCOUNTER — Ambulatory Visit: Payer: Self-pay | Admitting: General Practice

## 2023-08-27 ENCOUNTER — Observation Stay
Admission: EM | Admit: 2023-08-27 | Discharge: 2023-08-28 | Disposition: A | Discharge status: Home / Self Care | Attending: Internal Medicine | Admitting: Internal Medicine

## 2023-08-27 ENCOUNTER — Emergency Department

## 2023-08-27 ENCOUNTER — Other Ambulatory Visit: Payer: Self-pay

## 2023-08-27 DIAGNOSIS — H401190 Primary open-angle glaucoma, unspecified eye, stage unspecified: Secondary | ICD-10-CM | POA: Insufficient documentation

## 2023-08-27 DIAGNOSIS — R2689 Other abnormalities of gait and mobility: Secondary | ICD-10-CM | POA: Insufficient documentation

## 2023-08-27 DIAGNOSIS — R531 Weakness: Secondary | ICD-10-CM | POA: Diagnosis present

## 2023-08-27 DIAGNOSIS — E662 Morbid (severe) obesity with alveolar hypoventilation: Secondary | ICD-10-CM | POA: Insufficient documentation

## 2023-08-27 DIAGNOSIS — J189 Pneumonia, unspecified organism: Principal | ICD-10-CM | POA: Insufficient documentation

## 2023-08-27 DIAGNOSIS — K219 Gastro-esophageal reflux disease without esophagitis: Secondary | ICD-10-CM

## 2023-08-27 DIAGNOSIS — E872 Acidosis, unspecified: Secondary | ICD-10-CM

## 2023-08-27 DIAGNOSIS — I10 Essential (primary) hypertension: Secondary | ICD-10-CM | POA: Diagnosis present

## 2023-08-27 DIAGNOSIS — R6520 Severe sepsis without septic shock: Principal | ICD-10-CM | POA: Insufficient documentation

## 2023-08-27 DIAGNOSIS — E876 Hypokalemia: Secondary | ICD-10-CM | POA: Diagnosis not present

## 2023-08-27 DIAGNOSIS — N179 Acute kidney failure, unspecified: Secondary | ICD-10-CM | POA: Diagnosis not present

## 2023-08-27 DIAGNOSIS — A419 Sepsis, unspecified organism: Principal | ICD-10-CM | POA: Diagnosis present

## 2023-08-27 LAB — CBC WITH DIFFERENTIAL/PLATELET
Abs Immature Granulocytes: 0.04 K/uL (ref 0.00–0.07)
Basophils Absolute: 0 K/uL (ref 0.0–0.1)
Basophils Relative: 0 %
Eosinophils Absolute: 0.2 K/uL (ref 0.0–0.5)
Eosinophils Relative: 1 %
HCT: 40 % (ref 36.0–46.0)
Hemoglobin: 13.5 g/dL (ref 12.0–15.0)
Immature Granulocytes: 0 %
Lymphocytes Relative: 28 %
Lymphs Abs: 3.3 K/uL (ref 0.7–4.0)
MCH: 28.7 pg (ref 26.0–34.0)
MCHC: 33.8 g/dL (ref 30.0–36.0)
MCV: 84.9 fL (ref 80.0–100.0)
Monocytes Absolute: 0.8 K/uL (ref 0.1–1.0)
Monocytes Relative: 7 %
Neutro Abs: 7.4 K/uL (ref 1.7–7.7)
Neutrophils Relative %: 64 %
Platelets: 264 K/uL (ref 150–400)
RBC: 4.71 MIL/uL (ref 3.87–5.11)
RDW: 13.7 % (ref 11.5–15.5)
WBC: 11.8 K/uL — ABNORMAL HIGH (ref 4.0–10.5)
nRBC: 0 % (ref 0.0–0.2)

## 2023-08-27 LAB — COMPREHENSIVE METABOLIC PANEL WITH GFR
ALT: 11 U/L (ref 0–44)
AST: 24 U/L (ref 15–41)
Albumin: 3.2 g/dL — ABNORMAL LOW (ref 3.5–5.0)
Alkaline Phosphatase: 71 U/L (ref 38–126)
Anion gap: 10 (ref 5–15)
BUN: 20 mg/dL (ref 8–23)
CO2: 25 mmol/L (ref 22–32)
Calcium: 9.1 mg/dL (ref 8.9–10.3)
Chloride: 101 mmol/L (ref 98–111)
Creatinine, Ser: 1.41 mg/dL — ABNORMAL HIGH (ref 0.44–1.00)
GFR, Estimated: 36 mL/min — ABNORMAL LOW (ref >60)
Glucose, Bld: 127 mg/dL — ABNORMAL HIGH (ref 70–99)
Potassium: 3.9 mmol/L (ref 3.5–5.1)
Sodium: 136 mmol/L (ref 135–145)
Total Bilirubin: 0.9 mg/dL (ref 0.0–1.2)
Total Protein: 6.2 g/dL — ABNORMAL LOW (ref 6.5–8.1)

## 2023-08-27 LAB — URINALYSIS, W/ REFLEX TO CULTURE (INFECTION SUSPECTED)
Bacteria, UA: NONE SEEN
Bilirubin Urine: NEGATIVE
Glucose, UA: NEGATIVE mg/dL
Hgb urine dipstick: NEGATIVE
Ketones, ur: NEGATIVE mg/dL
Nitrite: NEGATIVE
Protein, ur: 30 mg/dL — AB
Specific Gravity, Urine: 1.009 (ref 1.005–1.030)
pH: 7 (ref 5.0–8.0)

## 2023-08-27 LAB — PROTIME-INR
INR: 1.1 (ref 0.8–1.2)
INR: 1.1 (ref 0.8–1.2)
Prothrombin Time: 14.6 s (ref 11.4–15.2)
Prothrombin Time: 15 s (ref 11.4–15.2)

## 2023-08-27 LAB — BASIC METABOLIC PANEL WITH GFR
Anion gap: 8 (ref 5–15)
BUN: 18 mg/dL (ref 8–23)
CO2: 27 mmol/L (ref 22–32)
Calcium: 9 mg/dL (ref 8.9–10.3)
Chloride: 103 mmol/L (ref 98–111)
Creatinine, Ser: 1.24 mg/dL — ABNORMAL HIGH (ref 0.44–1.00)
GFR, Estimated: 42 mL/min — ABNORMAL LOW (ref >60)
Glucose, Bld: 128 mg/dL — ABNORMAL HIGH (ref 70–99)
Potassium: 3.1 mmol/L — ABNORMAL LOW (ref 3.5–5.1)
Sodium: 138 mmol/L (ref 135–145)

## 2023-08-27 LAB — LACTIC ACID, PLASMA
Lactic Acid, Venous: 2.2 mmol/L (ref 0.5–1.9)
Lactic Acid, Venous: 3.2 mmol/L (ref 0.5–1.9)
Lactic Acid, Venous: 2 mmol/L (ref 0.5–1.9)
Lactic Acid, Venous: 1.2 mmol/L (ref 0.5–1.9)
Lactic Acid, Venous: 1.2 mmol/L (ref 0.5–1.9)

## 2023-08-27 LAB — LIPASE, BLOOD: Lipase: 48 U/L (ref 11–51)

## 2023-08-27 LAB — CORTISOL-AM, BLOOD: Cortisol - AM: 8.2 ug/dL (ref 6.7–22.6)

## 2023-08-27 MED ORDER — IPRATROPIUM-ALBUTEROL 0.5-2.5 (3) MG/3ML IN SOLN
3.0000 mL | Freq: Four times a day (QID) | RESPIRATORY_TRACT | Status: DC
  Filled 2023-08-27: qty 3

## 2023-08-27 MED ORDER — SODIUM CHLORIDE 0.9 % IV SOLN: 2.0000 g | INTRAVENOUS | Status: DC

## 2023-08-27 MED ORDER — LORATADINE 10 MG PO TABS
10.0000 mg | ORAL_TABLET | Freq: Every day | ORAL | Status: DC
  Filled 2023-08-27 (×2): qty 1

## 2023-08-27 MED ORDER — TRIAMTERENE-HCTZ 37.5-25 MG PO TABS
1.0000 | ORAL_TABLET | Freq: Every day | ORAL | Status: DC
  Administered 2023-08-27 – 2023-08-28 (×4): 1 via ORAL
  Filled 2023-08-27 (×2): qty 1

## 2023-08-27 MED ORDER — ACETAMINOPHEN 650 MG RE SUPP: 650.0000 mg | Freq: Four times a day (QID) | RECTAL | Status: DC | PRN

## 2023-08-27 MED ORDER — LACTATED RINGERS IV BOLUS (SEPSIS)
1000.0000 mL | Freq: Once | INTRAVENOUS | Status: AC
  Administered 2023-08-27 (×2): 1000 mL via INTRAVENOUS

## 2023-08-27 MED ORDER — VANCOMYCIN HCL 2000 MG/400ML IV SOLN
2000.0000 mg | Freq: Once | INTRAVENOUS | Status: AC
  Administered 2023-08-27 (×2): 2000 mg via INTRAVENOUS
  Filled 2023-08-27: qty 400

## 2023-08-27 MED ORDER — MAGNESIUM OXIDE -MG SUPPLEMENT 400 (240 MG) MG PO TABS
800.0000 mg | ORAL_TABLET | Freq: Every day | ORAL | Status: DC
Start: 2023-08-27 — End: 2023-08-28
  Administered 2023-08-27 (×2): 800 mg via ORAL
  Filled 2023-08-27: qty 2

## 2023-08-27 MED ORDER — ONDANSETRON HCL 4 MG/2ML IJ SOLN
4.0000 mg | INTRAMUSCULAR | Status: AC
  Administered 2023-08-27 (×2): 4 mg via INTRAVENOUS
  Filled 2023-08-27: qty 2

## 2023-08-27 MED ORDER — PANTOPRAZOLE SODIUM 40 MG PO TBEC
40.0000 mg | DELAYED_RELEASE_TABLET | Freq: Every day | ORAL | Status: DC
  Administered 2023-08-27 – 2023-08-28 (×4): 40 mg via ORAL
  Filled 2023-08-27 (×2): qty 1

## 2023-08-27 MED ORDER — TRAZODONE HCL 50 MG PO TABS
25.0000 mg | ORAL_TABLET | Freq: Every evening | ORAL | Status: DC | PRN
  Administered 2023-08-27 (×2): 25 mg via ORAL
  Filled 2023-08-27: qty 1

## 2023-08-27 MED ORDER — IPRATROPIUM-ALBUTEROL 0.5-2.5 (3) MG/3ML IN SOLN: 3.0000 mL | RESPIRATORY_TRACT | Status: DC | PRN

## 2023-08-27 MED ORDER — MAGNESIUM HYDROXIDE 400 MG/5ML PO SUSP: 30.0000 mL | Freq: Every day | ORAL | Status: DC | PRN

## 2023-08-27 MED ORDER — ACETAMINOPHEN 325 MG PO TABS
650.0000 mg | ORAL_TABLET | Freq: Four times a day (QID) | ORAL | Status: DC | PRN
  Administered 2023-08-28 (×2): 650 mg via ORAL
  Filled 2023-08-27: qty 2

## 2023-08-27 MED ORDER — POTASSIUM CHLORIDE CRYS ER 20 MEQ PO TBCR
40.0000 meq | EXTENDED_RELEASE_TABLET | Freq: Once | ORAL | Status: AC
  Administered 2023-08-27 (×2): 40 meq via ORAL
  Filled 2023-08-27: qty 2

## 2023-08-27 MED ORDER — LACTATED RINGERS IV SOLN
150.0000 mL/h | INTRAVENOUS | Status: DC
  Administered 2023-08-27 (×2): 150 mL/h via INTRAVENOUS

## 2023-08-27 MED ORDER — GUAIFENESIN ER 600 MG PO TB12
600.0000 mg | ORAL_TABLET | Freq: Two times a day (BID) | ORAL | Status: DC
  Filled 2023-08-27: qty 1

## 2023-08-27 MED ORDER — PROSIGHT PO TABS
1.0000 | ORAL_TABLET | Freq: Every day | ORAL | Status: DC
  Administered 2023-08-27 – 2023-08-28 (×4): 1 via ORAL
  Filled 2023-08-27 (×3): qty 1

## 2023-08-27 MED ORDER — METOPROLOL TARTRATE 25 MG/10 ML ORAL SUSPENSION
12.5000 mg | Freq: Two times a day (BID) | ORAL | Status: DC
  Filled 2023-08-27: qty 10

## 2023-08-27 MED ORDER — ONDANSETRON HCL 4 MG/2ML IJ SOLN: 4.0000 mg | Freq: Four times a day (QID) | INTRAMUSCULAR | Status: DC | PRN

## 2023-08-27 MED ORDER — HYDROCOD POLI-CHLORPHE POLI ER 10-8 MG/5ML PO SUER: 5.0000 mL | Freq: Two times a day (BID) | ORAL | Status: DC | PRN

## 2023-08-27 MED ORDER — ENOXAPARIN SODIUM 80 MG/0.8ML IJ SOSY
65.0000 mg | PREFILLED_SYRINGE | INTRAMUSCULAR | Status: DC
  Administered 2023-08-27 – 2023-08-28 (×4): 65 mg via SUBCUTANEOUS
  Filled 2023-08-27 (×2): qty 0.65

## 2023-08-27 MED ORDER — SODIUM CHLORIDE 0.9 % IV SOLN: 2.0000 g | Freq: Three times a day (TID) | INTRAVENOUS | Status: DC

## 2023-08-27 MED ORDER — SODIUM CHLORIDE 0.9 % IV SOLN
2.0000 g | Freq: Once | INTRAVENOUS | Status: AC
  Administered 2023-08-27 (×2): 2 g via INTRAVENOUS
  Filled 2023-08-27: qty 10

## 2023-08-27 MED ORDER — TRIAMTERENE-HCTZ 37.5-25 MG PO TABS
1.0000 | ORAL_TABLET | Freq: Every day | ORAL | Status: DC
  Filled 2023-08-27 (×2): qty 1

## 2023-08-27 MED ORDER — LACTATED RINGERS IV BOLUS
1000.0000 mL | Freq: Once | INTRAVENOUS | Status: AC
  Administered 2023-08-27 (×2): 1000 mL via INTRAVENOUS

## 2023-08-27 MED ORDER — VITAMIN D3 25 MCG (1000 UNIT) PO TABS
5000.0000 [IU] | ORAL_TABLET | Freq: Every day | ORAL | Status: DC
  Administered 2023-08-27 – 2023-08-28 (×4): 5000 [IU] via ORAL
  Filled 2023-08-27 (×4): qty 5

## 2023-08-27 MED ORDER — DULOXETINE HCL 30 MG PO CPEP
60.0000 mg | ORAL_CAPSULE | Freq: Every day | ORAL | Status: DC
  Administered 2023-08-27 – 2023-08-28 (×4): 60 mg via ORAL
  Filled 2023-08-27: qty 1
  Filled 2023-08-27: qty 2

## 2023-08-27 MED ORDER — VANCOMYCIN HCL IN DEXTROSE 1-5 GM/200ML-% IV SOLN: 1000.0000 mg | Freq: Once | INTRAVENOUS | Status: DC

## 2023-08-27 MED ORDER — ONDANSETRON HCL 4 MG PO TABS: 4.0000 mg | ORAL_TABLET | Freq: Four times a day (QID) | ORAL | Status: DC | PRN

## 2023-08-27 MED ORDER — SODIUM CHLORIDE 0.9 % IV SOLN
500.0000 mg | INTRAVENOUS | Status: DC
  Administered 2023-08-27 (×2): 500 mg via INTRAVENOUS
  Filled 2023-08-27: qty 5

## 2023-08-27 NOTE — ED Notes (Signed)
 Informed RN bed assigned

## 2023-08-27 NOTE — Assessment & Plan Note (Signed)
-   This is likely prerenal due to volume depletion and mild dehydration. - She will be hydrated with IV lactated ringer . - Will follow BMP. - Would avoid nephrotoxins.

## 2023-08-27 NOTE — Assessment & Plan Note (Addendum)
-   Will place on.  Labetalol and hold off Maxzide  given mild AKI.

## 2023-08-27 NOTE — ED Notes (Signed)
 Bair Hugger removed at this time.

## 2023-08-27 NOTE — ED Triage Notes (Addendum)
 BIB [facility] from home for nausea and weakness. Pt was found in the bathroom by family. Pt is bradycardic with PVCs.  94% on RA  157/71 BGL 109 18 LAC 1000 NS 4mg  Zofran  Pt is caox3, in no acute distress.  Pt was just seen on 7/26 for UC for UTI.

## 2023-08-27 NOTE — Progress Notes (Signed)
 CODE SEPSIS - PHARMACY COMMUNICATION  **Broad Spectrum Antibiotics should be administered within 1 hour of Sepsis diagnosis**  Time Code Sepsis Called/Page Received: 9753  Antibiotics Ordered: Aztreonam  and Vancomycin   Time of 1st antibiotic administration: 0312  Rankin CANDIE Dills, PharmD, [facility] 08/27/2023 2:46 AM

## 2023-08-27 NOTE — Plan of Care (Signed)

## 2023-08-27 NOTE — Assessment & Plan Note (Addendum)
-   Sepsis is manifested by tachypnea and hypothermia.  She may meet severe sepsis criteria based on elevated lactic acid level. - The patient will be admitted to a medical telemetry bed. - Will continue antibiotic therapy with IV Aztreonam  and Zithromax . - Mucolytic therapy be provided as well as duo nebs q.i.d. and q.4 hours p.r.n. - We will follow blood cultures and lactic acid.

## 2023-08-27 NOTE — Assessment & Plan Note (Signed)
-   Will continue PPI therapy.

## 2023-08-27 NOTE — Progress Notes (Signed)
 [facility]  - [place] [facility]   PATIENT NAME: Lindsey Jacobs    MR#:  [id number]  DATE OF BIRTH:  [birth year]  SUBJECTIVE:  no family at bedside. Patient was seen in the ER earlier. She came in after she felt dizzy lightheaded could not get up from her shower chair yesterday. Found to have some soft blood pressure and elevated lactic acid. Patient states she feels a whole lot better after receiving IV fluids. She was started empirically on antibiotics for possible sepsis. Patient denies any symptoms of cough shortness of breath any dysuria abdominal pain nausea vomiting. She is tolerating PO diet and overall doing well    VITALS:  Blood pressure 136/66, pulse 79, temperature 97.6 F (36.4 C), temperature source Oral, resp. rate 20, height 5' 5 (1.651 m), weight 130.6 kg, SpO2 100%.  PHYSICAL EXAMINATION:   GENERAL:  88 y.o.-year-old patient with no acute distress. Morbidly obese LUNGS: Normal breath sounds bilaterally, no wheezing CARDIOVASCULAR: S1, S2 normal. No murmur   ABDOMEN: Soft, nontender, nondistended. Bowel sounds present.  EXTREMITIES: No  edema b/l.    NEUROLOGIC: nonfocal  patient is alert and awake SKIN: No obvious rash, lesion, or ulcer.   LABORATORY PANEL:  CBC Recent Labs  Lab 08/27/23 0056  WBC 11.8*  HGB 13.5  HCT 40.0  PLT 264    Chemistries  Recent Labs  Lab 08/23/23 0917 08/27/23 0056 08/27/23 0544  NA 143 136 138  K 4.2 3.9 3.1*  CL 102 101 103  CO2 26 25 27   GLUCOSE 101* 127* 128*  BUN 13 20 18   CREATININE 0.99 1.41* 1.24*  CALCIUM 10.0 9.1 9.0  MG 2.1  --   --   AST  --  24  --   ALT  --  11  --   ALKPHOS  --  71  --   BILITOT  --  0.9  --    Cardiac Enzymes No results for input(s): TROPONINI in the last 168 hours. RADIOLOGY:  DG Chest Port 1 View Result Date: 08/27/2023 CLINICAL DATA:  Show sepsis EXAM: PORTABLE CHEST 1 VIEW COMPARISON:  06/26/2019 FINDINGS: Lung volumes are small. Left basilar focal  pulmonary infiltrate is present. Small left pleural effusion not excluded. Right lung is clear. No pneumothorax. No pleural effusion on the right. Cardiac size is mildly enlarged. Pulmonary vascularity is normal. IMPRESSION: 1. Pulmonary hypoinflation. 2. Left basilar focal pulmonary infiltrate. Possible small associated left pleural effusion. 3. Mild cardiomegaly. Electronically Signed   By: Dorethia Molt M.D.   On: 08/27/2023 01:23    Assessment and Plan  Lindsey Jacobs is a 88 y.o. African-American female with medical history significant for hypertension, dyslipidemia, OSA, depression, GERD and glaucoma, who presented to the emergency room with acute onset of altered mental status with significant generalized weakness and malaise tonight as well as nausea without vomiting or diarrhea.  Per family at bedside the patient was referred knocking on the wall for help with after which they found her in the bathroom slumped over and too weak to get up.   chest x-ray showed pulmonary hypoinflation, left basal focal pulmonary infiltrate vs atelectasis and possible small associated left pleural effusion as well as mild cardiomegaly.   Generalized weakness with elevated lactic acid dehydration, acute on chronic kidney disease stage IIIb -- patient admitted with sepsis with elevated lactic acid and mild elevation WB -- clinically sepsis resolved. And does not appear to be pneumonia. Patient denies any fever or shortness  of breath cough. Denies any dysuria. -- Lactic acid trended down after receiving IV fluids -- baseline creatinine 1.2 -- came in with creatinine 1.44, dizziness, soft blood pressure--- feels a whole lot better after IV fluids and creatinine back to 1.2 -- will hold further antibiotics.-- Blood cultures negative -- UA negative for UTI  Hypertension -- resume home meds from tomorrow  Hypokalemia -- suspect due to hydrochlorothiazide  -- PO KCl  Morbid obesity with history of sleep  apnea patient at home uses walker to do few steps mostly she is bedbound and chair bound -- denies any recent falls. Will have PT see patient  H/o Glaucoma --RPH to d/w pt to see if family can bring ED from home      Procedures: Family communication :left VM for dter [place] Consults :none CODE STATUS: FULL DVT Prophylaxis : enoxaparin  Level of care: Telemetry Medical Status is: Inpatient Remains inpatient appropriate because: hypotension, dehydration, weakness    TOTAL TIME TAKING CARE OF THIS PATIENT: 40 minutes.  >50% time spent on counselling and coordination of care  Note: This dictation was prepared with Dragon dictation along with smaller phrase technology. Any transcriptional errors that result from this process are unintentional.  Leita Blanch M.D    [facility]   CC: Primary care physician; Vincente Shivers, NP

## 2023-08-27 NOTE — ED Notes (Signed)
 Bear Hugger turned to low setting due to return of normal temperature.

## 2023-08-27 NOTE — Evaluation (Signed)
 Physical Therapy Evaluation Patient Details Name: Lindsey Jacobs MRN: [id number] DOB: July 08, 1935 Today's Date: 08/27/2023  History of Present Illness  Pt is an 88 y.o. female presenting to hospital 08/27/23 with c/o AMS, general malaise, nausea, and generalized weakness; pt found in bathroom slumped over.  Pt admitted with sepsis d/t PNA and AKI.  PMH includes htn, OSA, glaucoma, carpal tunnel release, B TKR.  Clinical Impression  Prior to recent medical concerns, pt reports being modified independent with functional mobility (pt reports walking with walker very short distances within home--about 5 feet at a time; uses motorized scooter or manual w/c to get from house to/from car or within home; uses manual w/c for MD appts); lives with her daughter and grandson on main level of home with small threshold to enter.  Currently pt is SBA semi-supine to sitting EOB; CGA with transfer using RW (pt using momentum to stand); and CGA to ambulate about 4 feet bed to recliner with RW use.  Pt would currently benefit from skilled PT to address noted impairments and functional limitations (see below for any additional details).  Upon hospital discharge, pt would benefit from ongoing therapy.     If plan is discharge home, recommend the following: A little help with walking and/or transfers;A little help with bathing/dressing/bathroom;Assistance with cooking/housework;Assist for transportation;Help with stairs or ramp for entrance   Can travel by private vehicle        Equipment Recommendations Other (comment) (pt has rollator at home already)  Recommendations for Other Services  OT consult    Functional Status Assessment Patient has had a recent decline in their functional status and demonstrates the ability to make significant improvements in function in a reasonable and predictable amount of time.     Precautions / Restrictions Precautions Precautions: Fall Recall of Precautions/Restrictions:  Intact Restrictions Weight Bearing Restrictions Per Provider Order: No      Mobility  Bed Mobility Overal bed mobility: Needs Assistance Bed Mobility: Supine to Sit     Supine to sit: Supervision, HOB elevated     General bed mobility comments: mild increased effort to perform on own    Transfers Overall transfer level: Needs assistance Equipment used: Rolling walker (2 wheels) Transfers: Sit to/from Stand Sit to Stand: Contact guard assist           General transfer comment: bed height mildly elevated per pt request; use of momentum to stand    Ambulation/Gait Ambulation/Gait assistance: Contact guard assist Gait Distance (Feet): 4 Feet (bed to recliner) Assistive device: Rolling walker (2 wheels)   Gait velocity: decreased     General Gait Details: increased effort/time to take steps (pt appearing intentional with taking steps) but appearing steady and safe  Stairs            Wheelchair Mobility     Tilt Bed    Modified Rankin (Stroke Patients Only)       Balance Overall balance assessment: Needs assistance Sitting-balance support: No upper extremity supported, Feet supported Sitting balance-Leahy Scale: Good Sitting balance - Comments: steady reaching within BOS   Standing balance support: Bilateral upper extremity supported, During functional activity, Reliant on assistive device for balance Standing balance-Leahy Scale: Good Standing balance comment: steady taking steps bed to recliner with RW use                             Pertinent Vitals/Pain Pain Assessment Pain Assessment: No/denies pain HR 75-86 bpm and  SpO2 sats 95% or greater on room air during session.    Home Living Family/patient expects to be discharged to:: Private residence Living Arrangements: Children (Lives with pt's daughter and grandson) Available Help at Discharge: Family;Available PRN/intermittently Type of Home: House Home Access:  (small 1 inch  threshold (go over via w/c or scooter))       Home Layout: Two level;Able to live on main level with bedroom/bathroom Home Equipment: Rollator (4 wheels);Wheelchair - Research officer, [employer] - single point;BSC/3in1;Shower seat;Tub bench;Grab bars - toilet;Grab bars - tub/shower (lightweight manual w/c)      Prior Function Prior Level of Function : Independent/Modified Independent             Mobility Comments: Modified independent ambulating very short distances to bathroom (approximately 5 feet) with rollator use; uses electric scooter or lightweight manual w/c for longer distances (house to car); manual w/c for MD appts ADLs Comments: Pt modified independent with toileting.  Daughter assists pt with set-up for showering.  Sits on tub bench (outside shower) for dressing, etc.     Extremity/Trunk Assessment   Upper Extremity Assessment Upper Extremity Assessment: Overall WFL for tasks assessed    Lower Extremity Assessment Lower Extremity Assessment: Generalized weakness       Communication   Communication Communication: No apparent difficulties    Cognition Arousal: Alert Behavior During Therapy: WFL for tasks assessed/performed   PT - Cognitive impairments: No apparent impairments                         Following commands: Intact       Cueing Cueing Techniques: Verbal cues     General Comments  Nursing cleared pt for participation in physical therapy.  Pt agreeable to PT session.    Exercises     Assessment/Plan    PT Assessment Patient needs continued PT services  PT Problem List Decreased strength;Decreased activity tolerance;Decreased mobility       PT Treatment Interventions DME instruction;Gait training;Functional mobility training;Therapeutic activities;Therapeutic exercise;Balance training;Patient/family education    PT Goals (Current goals can be found in the Care Plan section)  Acute Rehab PT Goals Patient Stated Goal: to go  home PT Goal Formulation: With patient Time For Goal Achievement: 09/10/23 Potential to Achieve Goals: Good    Frequency Min 2X/week     Co-evaluation               AM-PAC PT 6 Clicks Mobility  Outcome Measure Help needed turning from your back to your side while in a flat bed without using bedrails?: None Help needed moving from lying on your back to sitting on the side of a flat bed without using bedrails?: A Little Help needed moving to and from a bed to a chair (including a wheelchair)?: A Little Help needed standing up from a chair using your arms (e.g., wheelchair or bedside chair)?: A Little Help needed to walk in hospital room?: A Little Help needed climbing 3-5 steps with a railing? : A Lot 6 Click Score: 18    End of Session Equipment Utilized During Treatment: Gait belt Activity Tolerance: Patient tolerated treatment well Patient left: in chair;with call bell/phone within reach;with chair alarm set Nurse Communication: Mobility status;Precautions PT Visit Diagnosis: Other abnormalities of gait and mobility (R26.89);Muscle weakness (generalized) (M62.81)    Time: 8346-8276 PT Time Calculation (min) (ACUTE ONLY): 30 min   Charges:   PT Evaluation $PT Eval Low Complexity: 1 Low PT Treatments $Therapeutic Activity: 8-22  mins PT General Charges $$ ACUTE PT VISIT: 1 Visit        Damien Caulk, PT 08/27/23, 5:48 PM

## 2023-08-27 NOTE — ED Notes (Signed)
 Pt transferred from rm 2 to rm 31, assumed care, pt alert and oriented, respirations even and unlabored, no distress noted

## 2023-08-27 NOTE — ED Notes (Signed)
 Patient placed on bear hugger at medium temperature due to low rectal temperature.

## 2023-08-27 NOTE — H&P (Addendum)
 [place]   PATIENT NAME: Lindsey Jacobs    MR#:  [id number]  DATE OF BIRTH:  11-Tierre Gerard-1937  DATE OF ADMISSION:  08/27/2023  PRIMARY CARE PHYSICIAN: Vincente Shivers, NP   Patient is coming from: Home  REQUESTING/REFERRING PHYSICIAN: Gordan Huxley, MD  CHIEF COMPLAINT:   Chief Complaint  Patient presents with   Weakness   Nausea    HISTORY OF PRESENT ILLNESS:  Lindsey Jacobs is a 88 y.o. African-American female with medical history significant for hypertension, dyslipidemia, OSA, depression, GERD and glaucoma, who presented to the emergency room with acute onset of altered mental status with significant generalized weakness and malaise tonight as well as nausea without vomiting or diarrhea.  Per family at bedside the patient was referred knocking on the wall for help with after which they found her in the bathroom slumped over and too weak to get up.  She did not have any specific focal muscle weakness.  She thought she had fever and chills she was noted to be dyspneic by family though the patient denied.  She admits to mild dry cough.  No dysuria, oliguria or hematuria or flank pain.  No bleeding diathesis.  ED Course: When she came to the ER, BP was 139/56, temperature 95.7/35.3 with otherwise normal vital signs.  Later on respiratory rate was 22.  Labs revealed WBCs of 11.8 but lactic acid was 2.2 and later 3.2.  UA was remarkable for 30 protein and trace leukocytes with only 0-5 WBCs and no bacteria.  Urine and blood cultures were sent. EKG as reviewed by me :  EKG showed likely sinus rhythm with sinus arrhythmia and a rate of 73 with PVCs and left ventricular hypertrophy. Imaging: Portable chest x-ray showed pulmonary hypoinflation, left basal focal pulmonary infiltrate and possible small associated left pleural effusion as well as mild cardiomegaly.  The patient was given IV aztreonam  given penicillin and cephalosporins allergy, 2 L bolus of IV lactated ringer  as well as IV  vancomycin  and 4 mg of IV Zofran .  She will be admitted to a medical telemetry bed for further evaluation and management. PAST MEDICAL HISTORY:   Past Medical History:  Diagnosis Date   Allergy    Colon polyps    Depression    GERD (gastroesophageal reflux disease)    Glaucoma    History of chicken pox    Hyperlipidemia    Hypertension    Sleep apnea    Urinary incontinence     PAST SURGICAL HISTORY:   Past Surgical History:  Procedure Laterality Date   ABDOMINAL HYSTERECTOMY  1984   BREAST EXCISIONAL BIOPSY Right    benign   CARPAL TUNNEL RELEASE Right 1990   CATARACT EXTRACTION, BILATERAL Bilateral 2004   CESAREAN SECTION  1974   CHOLECYSTECTOMY  1974   COLONOSCOPY WITH PROPOFOL  N/A 05/11/2022   Procedure: COLONOSCOPY WITH PROPOFOL ;  Surgeon: Jinny Carmine, MD;  Location: [facility] ENDOSCOPY;  Service: Endoscopy;  Laterality: N/A;   ESOPHAGOGASTRODUODENOSCOPY (EGD) WITH PROPOFOL  N/A 05/20/2019   Procedure: ESOPHAGOGASTRODUODENOSCOPY (EGD) WITH PROPOFOL ;  Surgeon: Janalyn Keene NOVAK, MD;  Location: [facility] ENDOSCOPY;  Service: Endoscopy;  Laterality: N/A;   EYE SURGERY     JOINT REPLACEMENT     REPLACEMENT TOTAL KNEE Left 2007   REPLACEMENT TOTAL KNEE BILATERAL Right 2006   VARICOSE VEIN SURGERY Left 1965    SOCIAL HISTORY:   Social History   Tobacco Use   Smoking status: Never    Passive exposure: Never  Smokeless tobacco: Never  Substance Use Topics   Alcohol use: Never    FAMILY HISTORY:   Family History  Problem Relation Age of Onset   Heart disease Father    Colon polyps Father    Alcohol abuse Father    Breast cancer Neg Hx     DRUG ALLERGIES:   Allergies  Allergen Reactions   Amoxapine And Related     Hives   Amoxicillin Hives   Diamox [Acetazolamide]    Keflex  [Cephalexin ] Rash   Tape Rash    REVIEW OF SYSTEMS:   ROS As per history of present illness. All pertinent systems were reviewed above. Constitutional, HEENT, cardiovascular,  respiratory, GI, GU, musculoskeletal, neuro, psychiatric, endocrine, integumentary and hematologic systems were reviewed and are otherwise negative/unremarkable except for positive findings mentioned above in the HPI.   MEDICATIONS AT HOME:   Prior to Admission medications   Medication Sig Start Date End Date Taking? Authorizing Provider  cycloSPORINE (RESTASIS) 0.05 % ophthalmic emulsion Place 1 drop into both eyes at bedtime. 05/23/21  Yes [provider]  DULoxetine  (CYMBALTA ) 60 MG capsule Take 60 mg by mouth daily. 05/23/23  Yes [provider]  fluticasone  (FLONASE ) 50 MCG/ACT nasal spray Place 2 sprays into both nostrils daily. Patient taking differently: Place 2 sprays into both nostrils as needed. 01/02/23  Yes Gareth Mliss FALCON, FNP  Latanoprostene Bunod (VYZULTA) 0.024 % SOLN INSTILL 1 DROP IN BOTH EYES DAILY IN THE EVENING   Yes [provider]  naproxen  (NAPROSYN ) 500 MG tablet TAKE 1 TABLET DAILY AS NEEDED FOR MODERATE PAIN (PAIN SCORE 4 TO 6), TAKE WITH A MEAL 08/24/23  Yes Vincente Shivers, NP  pantoprazole  (PROTONIX ) 40 MG tablet Take 1 tablet (40 mg total) by mouth daily. 03/12/23  Yes Pender, Julie F, FNP  triamterene -hydrochlorothiazide  (MAXZIDE -25) 37.5-25 MG tablet Take 1 tablet by mouth daily. 03/19/23  Yes Agbor-Etang, Redell, MD  Carboxymeth-Glyc-Polysorb PF (REFRESH DIGITAL PF) 0.5-1-0.5 % SOLN Apply to eye.    [provider]  cetirizine  (ZYRTEC ) 10 MG tablet Take 1 tablet (10 mg total) by mouth daily. 03/12/23   Pender, Julie F, FNP  Cholecalciferol  (VITAMIN D3 MAXIMUM STRENGTH PO) Take by mouth.    [provider]  Magnesium  Oxide 500 MG TABS Take by mouth. Patient is taking 2 tablet once daily.    [provider]  Multiple Vitamins-Minerals (ADVANCED EYE HEALTH PO) Take by mouth.    [provider]      VITAL SIGNS:  Blood pressure (!) 149/69, pulse 85, temperature 98 F (36.7 C), temperature source Oral, resp. rate  17, height 5' 5 (1.651 m), weight 130.6 kg, SpO2 96%.  PHYSICAL EXAMINATION:  Physical Exam  GENERAL:  88 y.o.-year-old African-American female patient lying in the bed with no acute distress.  EYES: Pupils equal, round, reactive to light and accommodation. No scleral icterus. Extraocular muscles intact.  HEENT: Head atraumatic, normocephalic. Oropharynx and nasopharynx clear.  NECK:  Supple, no jugular venous distention. No thyroid  enlargement, no tenderness.  LUNGS: Diminished left basilar breath sounds with associated crackles.  No use of accessory muscles of respiration.  CARDIOVASCULAR: Regular rate and rhythm, S1, S2 normal. No murmurs, rubs, or gallops.  ABDOMEN: Soft, nondistended, nontender. Bowel sounds present. No organomegaly or mass.  EXTREMITIES: No pedal edema, cyanosis, or clubbing.  NEUROLOGIC: Cranial nerves II through XII are intact. Muscle strength 5/5 in all extremities. Sensation intact. Gait not checked.  PSYCHIATRIC: The patient is alert and oriented x 3.  Normal affect and good eye contact. SKIN: No obvious rash, lesion, or ulcer.   LABORATORY PANEL:   CBC Recent Labs  Lab 08/27/23 0056  WBC 11.8*  HGB 13.5  HCT 40.0  PLT 264   ------------------------------------------------------------------------------------------------------------------  Chemistries  Recent Labs  Lab 08/23/23 0917 08/27/23 0056 08/27/23 0544  NA 143 136 138  K 4.2 3.9 3.1*  CL 102 101 103  CO2 26 25 27   GLUCOSE 101* 127* 128*  BUN 13 20 18   CREATININE 0.99 1.41* 1.24*  CALCIUM 10.0 9.1 9.0  MG 2.1  --   --   AST  --  24  --   ALT  --  11  --   ALKPHOS  --  71  --   BILITOT  --  0.9  --    ------------------------------------------------------------------------------------------------------------------  Cardiac Enzymes No results for input(s): TROPONINI in the last 168  hours. ------------------------------------------------------------------------------------------------------------------  RADIOLOGY:  DG Chest Port 1 View Result Date: 08/27/2023 CLINICAL DATA:  Show sepsis EXAM: PORTABLE CHEST 1 VIEW COMPARISON:  06/26/2019 FINDINGS: Lung volumes are small. Left basilar focal pulmonary infiltrate is present. Small left pleural effusion not excluded. Right lung is clear. No pneumothorax. No pleural effusion on the right. Cardiac size is mildly enlarged. Pulmonary vascularity is normal. IMPRESSION: 1. Pulmonary hypoinflation. 2. Left basilar focal pulmonary infiltrate. Possible small associated left pleural effusion. 3. Mild cardiomegaly. Electronically Signed   By: Dorethia Molt M.D.   On: 08/27/2023 01:23      IMPRESSION AND PLAN:  Assessment and Plan: * Sepsis due to pneumonia (HCC) - Sepsis is manifested by tachypnea and hypothermia.  She may meet severe sepsis criteria based on elevated lactic acid level. - The patient will be admitted to a medical telemetry bed. - Will continue antibiotic therapy with IV Aztreonam  and Zithromax . - Mucolytic therapy be provided as well as duo nebs q.i.d. and q.4 hours p.r.n. - We will follow blood cultures and lactic acid.   AKI (acute kidney injury) (HCC) - This is likely prerenal due to volume depletion and mild dehydration. - She will be hydrated with IV lactated ringer . - Will follow BMP. - Would avoid nephrotoxins.  Essential hypertension - Will place on.  Labetalol and hold off Maxzide  given mild AKI.  GERD without esophagitis - Will continue PPI therapy.    DVT prophylaxis: Lovenox . Advanced Care Planning:  Code Status: full code. Family Communication:  The plan of care was discussed in details with the patient (and family). I answered all questions. The patient agreed to proceed with the above mentioned plan. Further management will depend upon hospital course. Disposition Plan: Back to previous  home environment Consults called: none. All the records are reviewed and case discussed with ED provider.  Status is: Inpatient  At the time of the admission, it appears that the appropriate admission status for this patient is inpatient.  This is judged to be reasonable and necessary in order to provide the required intensity of service to ensure the patient's safety given the presenting symptoms, physical exam findings and initial radiographic and laboratory data in the context of comorbid conditions.  The patient requires inpatient status due to high intensity of service, high risk of further deterioration and high frequency of surveillance required.  I certify that at the time of admission, it is my clinical judgment that the patient will require inpatient hospital care extending more than 2 midnights.  Dispo: The patient is from: Home              Anticipated d/c is to: Home              Patient currently is not medically stable to d/c.              Difficult to place patient: No  Madison DELENA Peaches M.D on 08/27/2023 at 7:04 AM  [facility]   From 7 PM-7 AM, contact night-coverage [email or web address]  CC: Primary care physician; Vincente Shivers, NP

## 2023-08-27 NOTE — Progress Notes (Signed)
 Pt being followed by [facility] for Sepsis protocol.

## 2023-08-27 NOTE — ED Provider Notes (Signed)
 [facility] Provider Note    Event Date/Time   First MD Initiated Contact with Patient 08/27/23 0056     (approximate)   History   Weakness and Nausea   HPI Lindsey Jacobs is a 88 y.o. female who presents by EMS for altered mental status, general malaise, and generalized weakness.  Her family is with her at bedside and said that they heard her knocking on the wall for help.  They found her in the bathroom slumped over and too weak to get up although she did not have any specific focal weakness.  She said that she felt alternatingly hot and cold (fever and chills) and was nauseated earlier although she is not vomiting.  Her family thought that she might have been a bit short of breath but she said that she did not feel short of breath.  She is had an okay last couple of days and started feeling ill relatively suddenly tonight.  No chest pain or abdominal pain.     Physical Exam   Triage Vital Signs: ED Triage Vitals  Encounter Vitals Group     BP 08/27/23 0059 (!) 139/56     Girls Systolic BP Percentile --      Girls Diastolic BP Percentile --      Boys Systolic BP Percentile --      Boys Diastolic BP Percentile --      Pulse Rate 08/27/23 0059 72     Resp 08/27/23 0059 16     Temp 08/27/23 0123 (!) 96.8 F (36 C)     Temp Source 08/27/23 0123 Axillary     SpO2 08/27/23 0059 96 %     Weight --      Height 08/27/23 0057 1.651 m (5' 5)     Head Circumference --      Peak Flow --      Pain Score 08/27/23 0056 0     Pain Loc --      Pain Education --      Exclude from Growth Chart --     Most recent vital signs: Vitals:   08/27/23 0430 08/27/23 0500  BP: 126/68 122/77  Pulse: 85 88  Resp: 16 16  Temp:    SpO2: 96% 98%    General: Awake and alert and saying that she feels better than she did before.  Nontoxic appearance. CV:  Good peripheral perfusion.  Regular rate and rhythm, normal heart sounds. Resp:  Normal effort. Speaking easily and  comfortably, no accessory muscle usage nor intercostal retractions.  Lungs are clear to auscultation bilaterally.  No wheezes, rales, nor rhonchi Abd:  No distention.  No tenderness to palpation throughout the abdomen. Other:  Trace peripheral edema in bilateral lower extremities, no unilateral leg swelling.   ED Results / Procedures / Treatments   Labs (all labs ordered are listed, but only abnormal results are displayed) Labs Reviewed  LACTIC ACID, PLASMA - Abnormal; Notable for the following components:      Result Value   Lactic Acid, Venous 2.2 (*)    All other components within normal limits  LACTIC ACID, PLASMA - Abnormal; Notable for the following components:   Lactic Acid, Venous 3.2 (*)    All other components within normal limits  COMPREHENSIVE METABOLIC PANEL WITH GFR - Abnormal; Notable for the following components:   Glucose, Bld 127 (*)    Creatinine, Ser 1.41 (*)    Total Protein 6.2 (*)    Albumin 3.2 (*)  GFR, Estimated 36 (*)    All other components within normal limits  CBC WITH DIFFERENTIAL/PLATELET - Abnormal; Notable for the following components:   WBC 11.8 (*)    All other components within normal limits  URINALYSIS, W/ REFLEX TO CULTURE (INFECTION SUSPECTED) - Abnormal; Notable for the following components:   Color, Urine YELLOW (*)    APPearance HAZY (*)    Protein, ur 30 (*)    Leukocytes,Ua TRACE (*)    All other components within normal limits  CULTURE, BLOOD (SINGLE)  CULTURE, BLOOD (SINGLE)  URINE CULTURE  PROTIME-INR  LIPASE, BLOOD  PROTIME-INR  CORTISOL-AM, BLOOD  BASIC METABOLIC PANEL WITH GFR     EKG  ED ECG REPORT I, Darleene Dome, the attending physician, personally viewed and interpreted this ECG.  Date: 08/27/2023 EKG Time: 1:02 AM Rate: 73 Rhythm: A-fib QRS Axis: Abnormal due to A-fib otherwise unremarkable Intervals: LVH ST/T Wave abnormalities: Non-specific ST segment / T-wave changes, but no clear evidence of acute  ischemia. Narrative Interpretation: no definitive evidence of acute ischemia; does not meet STEMI criteria.    RADIOLOGY See ED course for details   PROCEDURES:  Critical Care performed: Yes, see critical care procedure note(s)  .Critical Care  Performed by: Dome Darleene, MD Authorized by: Dome Darleene, MD   Critical care provider statement:    Critical care time (minutes):  30   Critical care time was exclusive of:  Separately billable procedures and treating other patients   Critical care was necessary to treat or prevent imminent or life-threatening deterioration of the following conditions:  Sepsis   Critical care was time spent personally by me on the following activities:  Development of treatment plan with patient or surrogate, evaluation of patient's response to treatment, examination of patient, obtaining history from patient or surrogate, ordering and performing treatments and interventions, ordering and review of laboratory studies, ordering and review of radiographic studies, pulse oximetry, re-evaluation of patient's condition and review of old charts .1-3 Lead EKG Interpretation  Performed by: Dome Darleene, MD Authorized by: Dome Darleene, MD     Interpretation: normal     ECG rate:  75   ECG rate assessment: normal     Rhythm: sinus rhythm     Ectopy: none     Conduction: normal       IMPRESSION / MDM / ASSESSMENT AND PLAN / ED COURSE  I reviewed the triage vital signs and the nursing notes.                              Differential diagnosis includes, but is not limited to, pneumonia, UTI, sepsis, ACS, electrolyte or metabolic abnormality, less likely CVA.  Patient's presentation is most consistent with acute presentation with potential threat to life or bodily function.  Labs/studies ordered: I ordered standard sepsis labs/studies including the following: respiratory viral panel PCR swab, blood cultures x2, pro time-INR, CMP, urinalysis, urine  culture, lactic acid, CBC with differential, lipase, 1-view chest x-ray, EKG.  Interventions/Medications given:  Medications  vancomycin  (VANCOREADY) IVPB 2000 mg/400 mL (2,000 mg Intravenous New Bag/Given 08/27/23 0352)  lactated ringers  infusion (has no administration in time range)  enoxaparin  (LOVENOX ) injection 65 mg (has no administration in time range)  azithromycin  (ZITHROMAX ) 500 mg in sodium chloride  0.9 % 250 mL IVPB (has no administration in time range)  acetaminophen  (TYLENOL ) tablet 650 mg (has no administration in time range)    Or  acetaminophen  (  TYLENOL ) suppository 650 mg (has no administration in time range)  magnesium  hydroxide (MILK OF MAGNESIA) suspension 30 mL (has no administration in time range)  ondansetron  (ZOFRAN ) tablet 4 mg (has no administration in time range)    Or  ondansetron  (ZOFRAN ) injection 4 mg (has no administration in time range)  traZODone  (DESYREL ) tablet 25 mg (has no administration in time range)  guaiFENesin  (MUCINEX ) 12 hr tablet 600 mg (has no administration in time range)  chlorpheniramine-HYDROcodone (TUSSIONEX) 10-8 MG/5ML suspension 5 mL (has no administration in time range)  ipratropium-albuterol  (DUONEB) 0.5-2.5 (3) MG/3ML nebulizer solution 3 mL (has no administration in time range)  ipratropium-albuterol  (DUONEB) 0.5-2.5 (3) MG/3ML nebulizer solution 3 mL (has no administration in time range)  aztreonam  (AZACTAM ) 2 g in sodium chloride  0.9 % 100 mL IVPB (has no administration in time range)  DULoxetine  (CYMBALTA ) DR capsule 60 mg (has no administration in time range)  pantoprazole  (PROTONIX ) EC tablet 40 mg (has no administration in time range)  cholecalciferol  (VITAMIN D3) tablet 5,000 Units (has no administration in time range)  multivitamin (PROSIGHT) tablet 1 tablet (has no administration in time range)  loratadine  (CLARITIN ) tablet 10 mg (has no administration in time range)  magnesium  oxide (MAG-OX) tablet 800 mg (has no  administration in time range)  lactated ringers  bolus 1,000 mL (0 mLs Intravenous Stopped 08/27/23 0345)  ondansetron  (ZOFRAN ) injection 4 mg (4 mg Intravenous Given 08/27/23 0224)  lactated ringers  bolus 1,000 mL (0 mLs Intravenous Stopped 08/27/23 0425)  aztreonam  (AZACTAM ) 2 g in sodium chloride  0.9 % 100 mL IVPB (0 g Intravenous Stopped 08/27/23 0345)    (Note:  hospital course my include additional interventions and/or labs/studies not listed above.)   Patient was initially hypothermic but this was axillary and we will repeat with a rectal temperature.  Vital signs are otherwise unremarkable and she is not hypoxic but overall appearance is suggestive of sepsis.  Will initiate a probable sepsis workup.  Patient has mild AKI on CMP and I ordered 1 L of LR to begin rehydration.  Minimal leukocytosis of 11.8.  The patient is on the cardiac monitor to evaluate for evidence of arrhythmia and/or significant heart rate changes.   Clinical Course as of 08/27/23 0545  Mon Aug 27, 2023  0237 [facility] Chest [facility] 1 View I independently viewed and interpreted the patient's chest x-ray looks like she has a left lower lobe pneumonia.  This is confirmed by the radiologist.  In the meantime, the patient's rectal temperature was measured and it is 95.7.  She meets sepsis criteria and I am going to make her sepsis protocol and treat her empirically with cefepime 2 g IV and vancomycin  1 g IV for unknown source although the focal abnormality on x-ray looks likely. [CF]  [phone] Of note, patient reportedly has allergies to cephalosporins and penicillins so alternatively I ordered aztreonam  2 g IV.  I ordered a second liter of IV fluids LR so that we would exceed the 30 mL/kg IV fluid bolus goal based on ideal body weight, not actual body weight [CF]  0329 Urinalysis is unremarkable, repeat lactic acid is pending, patient has a mild AKI.  Will proceed with admission and I have consulted the hospitalist service. [CF]  [id number] I  consulted by phone with the admitting hospitalist, and they will admit the patient - Dr. Lawence.  Sepsis reassessment complete - patient remains hemodynamically stable, alert and oriented, feeling much better than before. [CF]    Clinical Course User Index [  CF] Gordan Huxley, MD     FINAL CLINICAL IMPRESSION(S) / ED DIAGNOSES   Final diagnoses:  Severe sepsis (HCC)  Pneumonia of left lower lobe due to infectious organism     Rx / DC Orders   ED Discharge Orders     None        Note:  This document was prepared using Dragon voice recognition software and may include unintentional dictation errors.   Gordan Huxley, MD 08/27/23 [phone]

## 2023-08-27 NOTE — Progress Notes (Signed)
 PHARMACIST - PHYSICIAN COMMUNICATION  CONCERNING:  Enoxaparin  (Lovenox ) for DVT Prophylaxis    RECOMMENDATION: Patient was prescribed enoxaprin 40mg  q24 hours for VTE prophylaxis.   Filed Weights   08/27/23 0242  Weight: 130.6 kg (287 lb 14.7 oz)    Body mass index is 47.91 kg/m.  Estimated Creatinine Clearance: 39.1 mL/min (A) (by C-G formula based on SCr of 1.41 mg/dL (H)).   Based on [facility] policy patient is candidate for enoxaparin  0.5mg /kg TBW SQ every 24 hours based on BMI being >30.  DESCRIPTION: Pharmacy has adjusted enoxaparin  dose per [facility] policy.  Patient is now receiving enoxaparin  0.5 mg/kg every 24 hours   Rankin CANDIE Dills, PharmD, [facility] 08/27/2023 3:51 AM

## 2023-08-27 NOTE — Assessment & Plan Note (Addendum)
-   Will place on p.o. Lopressor  for now and repeat EKG. - I will hold off anticoagulation for now pending assessment by PT for fall risk.

## 2023-08-28 DIAGNOSIS — E872 Acidosis, unspecified: Secondary | ICD-10-CM | POA: Diagnosis not present

## 2023-08-28 DIAGNOSIS — N179 Acute kidney failure, unspecified: Secondary | ICD-10-CM | POA: Diagnosis not present

## 2023-08-28 DIAGNOSIS — E876 Hypokalemia: Secondary | ICD-10-CM | POA: Diagnosis not present

## 2023-08-28 DIAGNOSIS — K219 Gastro-esophageal reflux disease without esophagitis: Secondary | ICD-10-CM | POA: Diagnosis not present

## 2023-08-28 DIAGNOSIS — J189 Pneumonia, unspecified organism: Secondary | ICD-10-CM | POA: Diagnosis not present

## 2023-08-28 NOTE — Care Management Important Message (Signed)
 Important Message  Patient Details  Name: Lindsey Jacobs MRN: [id number] Date of Birth: 10-11-1935   Important Message Given:  Yes - Medicare IM     Rojelio [name] 08/28/2023, 10:54 AM

## 2023-08-28 NOTE — TOC Initial Note (Addendum)
 Transition of Care [facility]) - Initial/Assessment Note    Patient Details  Name: Lindsey Jacobs MRN: [id number] Date of Birth: [birth year]  Transition of Care [facility]) CM/SW Contact:    [place]berry CHRISTELLA Jaksch, RN Phone Number: 08/28/2023, 10:32 AM  Clinical Narrative:                 Admitted for: Sepsis  Admitted from: Home PCP: Carrol Aurora, NP Current home health/prior home health/DME: Rollator, manual WC, electric scooter, single point cane, BSC (3 in 1), shower seat, grab bars.  Spoke with patient at bedside regarding discharge planning. Patient refuses home health services. She states she has had services before and didn't see a noticeable difference. She feels safe to discharge home and has all required DME. She states her daughter is retired and helps with any care needed. Her daughter is to provide transport at discharge.  Expected Discharge Plan: Home/Self Care Barriers to Discharge: Barriers Resolved   Patient Goals and CMS Choice Patient states their goals for this hospitalization and ongoing recovery are:: Going home          Expected Discharge Plan and Services                                   HH Arranged: Patient Refused HH          Prior Living Arrangements/Services   Lives with:: Adult Children (Daughter and grandson) Patient language and need for interpreter reviewed:: Yes Do you feel safe going back to the place where you live?: Yes      Need for Family Participation in Patient Care: Yes (Comment) Care giver support system in place?: Yes (comment) Current home services: DME (Patient states she has a rollator, manual WC, electric scooter, single point cane, BSC (3 in1), shower seat, and grab bars)    Activities of Daily Living   ADL Screening (condition at time of admission) Independently performs ADLs?: Yes (appropriate for developmental age) Is the patient deaf or have difficulty hearing?: No Does the patient have difficulty seeing, even when  wearing glasses/contacts?: No Does the patient have difficulty concentrating, remembering, or making decisions?: No  Permission Sought/Granted                  Emotional Assessment Appearance:: Appears stated age     Orientation: : Oriented to Self, Oriented to Place, Oriented to  Time, Oriented to Situation Alcohol / Substance Use: Never Used    Admission diagnosis:  Severe sepsis (HCC) [A41.9, R65.20] Sepsis due to pneumonia (HCC) [J18.9, A41.9] Pneumonia of left lower lobe due to infectious organism [J18.9] Patient Active Problem List   Diagnosis Date Noted   Sepsis due to pneumonia (HCC) 08/27/2023   AKI (acute kidney injury) (HCC) 08/27/2023   GERD without esophagitis 08/27/2023   Lactic acid acidosis 08/27/2023   Primary osteoarthritis of ankle 08/02/2023   Aortic atherosclerosis (HCC) 03/12/2023   Pain of cervical spine 02/15/2023   Localized, primary osteoarthritis of shoulder region 02/15/2023   Functional urinary incontinence 07/28/2022   Cervical radiculitis 02/15/2021   CKD (chronic kidney disease) stage 3, GFR 30-59 ml/min (HCC) 09/14/2020   HLD (hyperlipidemia) 09/14/2020   Class 3 severe obesity due to excess calories with body mass index (BMI) of 45.0 to 49.9 in adult 09/14/2020   Vertigo 08/18/2019   Prediabetes 05/14/2019   AMD (age-related macular degeneration), bilateral 04/26/2018   Gastroesophageal reflux disease without esophagitis 02/12/2018  Essential hypertension 01/02/2018   Osteoarthritis 01/02/2018   Obstructive sleep apnea syndrome 01/02/2018   PCP:  Vincente Shivers, NP Pharmacy:   [id number] [place], [place] - [address] AT [facility] OF [address] [place] [address] Phone: [phone] Fax: [phone]  [id number] - [place], [place] - [address] AT [facility] OF [address] [facility] [address] [place] [address] Phone: [phone] Fax:  [phone]  [facility] - [place], [place] - [address] [place] [address] Phone: [phone] Fax: [phone]     Social Drivers of Health (SDOH) Social History: SDOH Screenings   Food Insecurity: No Food Insecurity (08/27/2023)  Housing: Low Risk  (08/27/2023)  Transportation Needs: No Transportation Needs (08/27/2023)  Utilities: Not At Risk (08/27/2023)  Alcohol Screen: Low Risk  (07/24/2023)  Depression (PHQ2-9): Low Risk  (08/23/2023)  Financial Resource Strain: Low Risk  (07/24/2023)  Physical Activity: Inactive (07/24/2023)  Social Connections: Moderately Integrated (08/27/2023)  Stress: No Stress Concern Present (07/24/2023)  Tobacco Use: Low Risk  (08/27/2023)  Health Literacy: Adequate Health Literacy (07/24/2023)   SDOH Interventions:     Readmission Risk Interventions     No data to display

## 2023-08-28 NOTE — Evaluation (Signed)
 Occupational Therapy Evaluation Patient Details Name: Lindsey Jacobs MRN: [id number] DOB: [birth year] Today's Date: 08/28/2023   History of Present Illness   Pt is an 88 y.o. female presenting to hospital 08/27/23 with c/o AMS, general malaise, nausea, and generalized weakness; pt found in bathroom slumped over.  Pt admitted with sepsis d/t PNA and AKI.  PMH includes htn, OSA, glaucoma, carpal tunnel release, B TKR.     Clinical Impressions Pt was seen for OT evaluation this date. Prior to hospital admission, pt was amb with a 4WW ~50ft at a time, often uses electric scooter within the home. PTA pt was mostly MODI for BADLs, however reports requiring setupA from her daughter for bathing tasks. Pt lives with her daughter and grandson in two level home, pt lives on the main floor. Pt presents with deficits in activity tolerance and pain management affecting safe and optimal ADL completion. Pt completed bed mobility with supervision, donning bilateral shoes with setupA. Pt currently requires CGA for STS from elevated EOB with use of bed rails and RW. Pt attempted to amb to the BR but bil shoulder pain limited ability to push RW for safe amb. Pt requested to sit down after ~76ft, pt endorses this is close to her functional baseline. Pt retired with all needs in reach, awaiting on her breakfast. Pt would benefit from skilled OT services to address noted impairments and functional limitations (see below for any additional details) in order to maximize safety and independence while minimizing falls risk and caregiver burden. OT will follow acutely.     If plan is discharge home, recommend the following:   A little help with bathing/dressing/bathroom;Assist for transportation;Assistance with cooking/housework;A little help with walking and/or transfers;Help with stairs or ramp for entrance     Functional Status Assessment   Patient has had a recent decline in their functional status and demonstrates the  ability to make significant improvements in function in a reasonable and predictable amount of time.     Equipment Recommendations   BSC/3in1     Recommendations for Other Services         Precautions/Restrictions   Precautions Precautions: Fall Recall of Precautions/Restrictions: Intact Restrictions Weight Bearing Restrictions Per Provider Order: No     Mobility Bed Mobility Overal bed mobility: Needs Assistance Bed Mobility: Supine to Sit     Supine to sit: Supervision, HOB elevated     General bed mobility comments: No physical assistance, mild effort to perform    Transfers Overall transfer level: Needs assistance Equipment used: Rolling walker (2 wheels) Transfers: Sit to/from Stand Sit to Stand: Contact guard assist           General transfer comment: Slightly elevated bed height, STS with momentum technique, CGA      Balance Overall balance assessment: Needs assistance Sitting-balance support: No upper extremity supported, Feet supported Sitting balance-Leahy Scale: Good Sitting balance - Comments: steady reaching outside BOS during LB dressing task   Standing balance support: Bilateral upper extremity supported, During functional activity, Reliant on assistive device for balance Standing balance-Leahy Scale: Good                             ADL either performed or assessed with clinical judgement   ADL Overall ADL's : Needs assistance/impaired Eating/Feeding: Set up;Sitting                   Lower Body Dressing: Set up;Sitting/lateral leans Lower Body Dressing  Details (indicate cue type and reason): Donning bil shoes/socks at [employer] Transfer: Ambulation;Rolling walker (2 wheels);Contact guard assist Toilet Transfer Details (indicate cue type and reason): Simulated toilet t/f, EOB<>recliner         Functional mobility during ADLs: Rolling walker (2 wheels);Contact guard assist General ADL Comments: CGA simulated  toilet transfer, setupA for LB dressing     Vision Baseline Vision/History: 1 Wears glasses                         Pertinent Vitals/Pain Pain Assessment Pain Assessment: 0-10 Pain Score: 6  Pain Location: Cervical spine Pain Descriptors / Indicators: Discomfort Pain Intervention(s): Limited activity within patient's tolerance, Monitored during session, Patient requesting pain meds-RN notified     Extremity/Trunk Assessment Upper Extremity Assessment Upper Extremity Assessment: RUE deficits/detail;LUE deficits/detail RUE Deficits / Details: Bil shoulder pain pt reports due to RA, gets shots regularly RUE: Unable to fully assess due to pain RUE Sensation: history of peripheral neuropathy LUE Deficits / Details: Bil shoulder pain pt reports due to RA, gets shots regularly LUE: Unable to fully assess due to pain LUE Sensation: history of peripheral neuropathy   Lower Extremity Assessment Lower Extremity Assessment: Defer to PT evaluation;Generalized weakness (Pt reports RA in R ankle, often it goes)   Cervical / Trunk Assessment Cervical / Trunk Assessment: Other exceptions (Pt states chronic cervial spine pain)   Communication Communication Communication: No apparent difficulties   Cognition Arousal: Alert Behavior During Therapy: WFL for tasks assessed/performed               OT - Cognition Comments: A/Ox4                 Following commands: Intact       Cueing  General Comments   Cueing Techniques: Verbal cues  Pt reports limited functional mobility at baseline, limited on this date due to bilateral shoulder pain when use RW for UE support   Exercises Exercises: Other exercises Other Exercises Other Exercises: Edu: Role of OT, safe ADL completion, fall prevention at d/c, DME recomendations   Shoulder Instructions      Home Living Family/patient expects to be discharged to:: Private residence Living Arrangements: Children Available Help  at Discharge: Family;Available PRN/intermittently Type of Home: House Home Access: Other (comment) (small 1 inch threshold (go over via w/c or scooter))     Home Layout: Two level;Able to live on main level with bedroom/bathroom     Bathroom Shower/Tub: Producer, television/film/video: Handicapped height     Home Equipment: Rollator (4 wheels);Wheelchair - Research officer, trade [employer] - single point;BSC/3in1;Shower seat;Tub bench;Grab bars - toilet;Grab bars - tub/shower          Prior Functioning/Environment Prior Level of Function : Independent/Modified Independent             Mobility Comments: Modified independent ambulating very short distances to bathroom (approximately 5 feet) with rollator use; uses electric scooter or lightweight manual w/c for longer distances (house to car); manual w/c for MD appts ADLs Comments: Pt modified independent with toileting.  Daughter assists pt with set-up for showering.  Sits on tub bench (outside shower) for dressing, etc.    OT Problem List: Decreased strength;Decreased range of motion;Decreased activity tolerance;Impaired balance (sitting and/or standing);Decreased safety awareness;Decreased knowledge of use of DME or AE;Pain   OT Treatment/Interventions: Self-care/ADL training;Therapeutic exercise;Energy conservation;DME and/or AE instruction;Therapeutic activities;Patient/family education;Balance training      OT Goals(Current goals can  be found in the care plan section)   Acute Rehab OT Goals Patient Stated Goal: Return home OT Goal Formulation: With patient Time For Goal Achievement: 09/11/23 Potential to Achieve Goals: Good ADL Goals Pt Will Perform Grooming: with modified independence;standing Pt Will Perform Lower Body Dressing: with modified independence;sitting/lateral leans Pt Will Transfer to Toilet: with modified independence;ambulating Pt Will Perform Toileting - Clothing Manipulation and hygiene: with modified  independence;sitting/lateral leans   OT Frequency:  Min 2X/week    Co-evaluation              AM-PAC OT 6 Clicks Daily Activity     Outcome Measure Help from another person eating meals?: None Help from another person taking care of personal grooming?: A Little Help from another person toileting, which includes using toliet, bedpan, or urinal?: A Little Help from another person bathing (including washing, rinsing, drying)?: A Little Help from another person to put on and taking off regular upper body clothing?: A Little Help from another person to put on and taking off regular lower body clothing?: A Little 6 Click Score: 19   End of Session Equipment Utilized During Treatment: Gait belt;Rolling walker (2 wheels) Nurse Communication: Mobility status;Patient requests pain meds  Activity Tolerance: Patient tolerated treatment well Patient left: in chair;with call bell/phone within reach;with chair alarm set  OT Visit Diagnosis: Unsteadiness on feet (R26.81);Other abnormalities of gait and mobility (R26.89);Muscle weakness (generalized) (M62.81);Pain Pain - part of body: Ankle and joints of foot;Shoulder                Time: 9140-9080 OT Time Calculation (min): 20 min Charges:  OT General Charges $OT Visit: 1 Visit OT Evaluation $OT Eval Low Complexity: 1 Low OT Treatments $Self Care/Home Management : 8-22 mins  Larraine Colas M.S. OTR/L  08/28/23, 9:47 AM

## 2023-08-28 NOTE — Discharge Summary (Signed)
 Physician Discharge Summary   Patient: Lindsey Jacobs MRN: [id number] DOB: August 31, 1935  Admit date:     08/27/2023  Discharge date: 08/28/23  Discharge Physician: Leita Blanch   PCP: Vincente Shivers, NP   Recommendations at discharge:   keep log of your blood sugar MVP at home. Hydrate yourself. Follow-up with PCP in 1 to 2 weeks.  Discharge Diagnoses: Principal Problem:   Sepsis due to pneumonia [facility]) Active Problems:   AKI (acute kidney injury) (HCC)   Essential hypertension   GERD without esophagitis   Lactic acid acidosis  Lindsey Jacobs is a 88 y.o. African-American female with medical history significant for hypertension, dyslipidemia, OSA, depression, GERD and glaucoma, who presented to the emergency room with acute onset of altered mental status with significant generalized weakness and malaise tonight as well as nausea without vomiting or diarrhea.  Per family at bedside the patient was referred knocking on the wall for help with after which they found her in the bathroom slumped over and too weak to get up.    chest x-ray showed pulmonary hypoinflation, left basal focal pulmonary infiltrate vs atelectasis and possible small associated left pleural effusion as well as mild cardiomegaly.    Generalized weakness with elevated lactic acid dehydration, acute on chronic kidney disease stage IIIb -- patient admitted with sepsis with elevated lactic acid and mild elevation WB -- clinically sepsis resolved. And does not appear to be pneumonia. Patient denies any fever or shortness of breath cough. Denies any dysuria. -- Lactic acid trended down after receiving IV fluids -- baseline creatinine 1.2 -- came in with creatinine 1.44, dizziness, soft blood pressure--- feels a whole lot better after IV fluids and creatinine back to 1.2 -- will hold further antibiotics.-- Blood cultures negative -- UA negative for UTI -- patient overall feels back to baseline. She worked with PT OT. They recommend  home health PT OT however patient is declining services. -- Patient is eager to go home feels better a good breakfast. Vitals stable.   Hypertension -- resume home meds at discharge   Hypokalemia -- suspect due to hydrochlorothiazide  -- PO KCl   Morbid obesity with history of sleep apnea patient at home uses walker to do few steps mostly she is bedbound and chair bound -- denies any recent falls. -- Seen by PT OT   H/o Glaucoma --RPH to d/w pt to see if family can bring ED from home    Discharge to home. Patient agreeable.         Procedures:none Family communication :per pt dter Asberry is aware of the plan Consults :none CODE STATUS: FULL DVT Prophylaxis : enoxaparin     Diet recommendation:  Discharge Diet Orders (From admission, onward)     Start     Ordered   08/28/23 0000  Diet - low sodium heart healthy        08/28/23 1033           Cardiac diet DISCHARGE MEDICATION: Allergies as of 08/28/2023       Reactions   Amoxapine And Related    Hives   Amoxicillin Hives   Diamox [acetazolamide]    Keflex  [cephalexin ] Rash   Tape Rash        Medication List     TAKE these medications    ADVANCED EYE HEALTH PO Take by mouth.   cetirizine  10 MG tablet Commonly known as: ZYRTEC  Take 1 tablet (10 mg total) by mouth daily.   cycloSPORINE 0.05 % ophthalmic emulsion Commonly  known as: RESTASIS Place 1 drop into both eyes at bedtime.   DULoxetine  60 MG capsule Commonly known as: CYMBALTA  Take 60 mg by mouth daily.   fluticasone  50 MCG/ACT nasal spray Commonly known as: FLONASE  Place 2 sprays into both nostrils daily. What changed:  when to take this reasons to take this   Magnesium  Oxide -Mg Supplement 500 MG Tabs Take by mouth. Patient is taking 2 tablet once daily.   naproxen  500 MG tablet Commonly known as: NAPROSYN  TAKE 1 TABLET DAILY AS NEEDED FOR MODERATE PAIN (PAIN SCORE 4 TO 6), TAKE WITH A MEAL   pantoprazole  40 MG  tablet Commonly known as: PROTONIX  Take 1 tablet (40 mg total) by mouth daily.   Refresh Digital PF 0.5-1-0.5 % Soln Generic drug: Carboxymeth-Glyc-Polysorb PF Apply to eye.   triamterene -hydrochlorothiazide  37.5-25 MG tablet Commonly known as: MAXZIDE -25 Take 1 tablet by mouth daily.   VITAMIN D3 MAXIMUM STRENGTH PO Take by mouth.   Vyzulta 0.024 % Soln Generic drug: Latanoprostene Bunod INSTILL 1 DROP IN BOTH EYES DAILY IN THE EVENING        Follow-up Information     Vincente Shivers, NP. Schedule an appointment as soon as possible for a visit in 1 week(s).   Specialty: General Practice Contact information: [address] [place] [address] [phone]                Discharge Exam: Lindsey Jacobs   08/27/23 0242  Weight: 130.6 kg    GENERAL:  88 y.o.-year-old patient with no acute distress. Morbidly obese LUNGS: Normal breath sounds bilaterally, no wheezing CARDIOVASCULAR: S1, S2 normal. No murmur   ABDOMEN: Soft, nontender, nondistended. Bowel sounds present.  EXTREMITIES: No  edema b/l.    NEUROLOGIC: nonfocal  patient is alert and awake SKIN: No obvious rash, lesion, or ulcer.  Condition at discharge: fair  The results of significant diagnostics from this hospitalization (including imaging, microbiology, ancillary and laboratory) are listed below for reference.   Imaging Studies: DG Chest Port 1 View Result Date: 08/27/2023 CLINICAL DATA:  Show sepsis EXAM: PORTABLE CHEST 1 VIEW COMPARISON:  06/26/2019 FINDINGS: Lung volumes are small. Left basilar focal pulmonary infiltrate is present. Small left pleural effusion not excluded. Right lung is clear. No pneumothorax. No pleural effusion on the right. Cardiac size is mildly enlarged. Pulmonary vascularity is normal. IMPRESSION: 1. Pulmonary hypoinflation. 2. Left basilar focal pulmonary infiltrate. Possible small associated left pleural effusion. 3. Mild cardiomegaly. Electronically Signed   By:  Dorethia Molt M.D.   On: 08/27/2023 01:23    Microbiology: Results for orders placed or performed during the hospital encounter of 08/27/23  Blood culture (routine single)     Status: None (Preliminary result)   Collection Time: 08/27/23  1:15 AM   Specimen: BLOOD  Result Value Ref Range Status   Specimen Description BLOOD BLOOD RIGHT ARM  Final   Special Requests   Final    BOTTLES DRAWN AEROBIC AND ANAEROBIC Blood Culture results may not be optimal due to an inadequate volume of blood received in culture bottles   Culture   Final    NO GROWTH < 12 HOURS Performed at [facility], [address], [place], [place] [address]    Report Status PENDING  Incomplete  Urine Culture     Status: None (Preliminary result)   Collection Time: 08/27/23  2:38 AM   Specimen: In/Out Cath Urine  Result Value Ref Range Status   Specimen Description  Final    IN/OUT CATH URINE Performed at [facility], [address], [place], [place] [address]    Special Requests   Final    NONE Performed at [facility], [address], [place], [place] [address]    Culture   Final    CULTURE REINCUBATED FOR BETTER GROWTH Performed at [facility], [address] [place], [place] [address]    Report Status PENDING  Incomplete  Culture, blood (single)     Status: None (Preliminary result)   Collection Time: 08/27/23  3:04 AM   Specimen: BLOOD LEFT ARM  Result Value Ref Range Status   Specimen Description BLOOD LEFT ARM  Final   Special Requests   Final    BOTTLES DRAWN AEROBIC AND ANAEROBIC Blood Culture results may not be optimal due to an inadequate volume of blood received in culture bottles   Culture   Final    NO GROWTH <12 HOURS Performed at [facility], [address] [place], [place] [address]    Report Status PENDING  Incomplete    Labs: CBC: Recent Labs  Lab 08/27/23 0056  WBC 11.8*  NEUTROABS 7.4  HGB 13.5  HCT 40.0  MCV 84.9  PLT  264   Basic Metabolic Panel: Recent Labs  Lab 08/23/23 0917 08/27/23 0056 08/27/23 0544  NA 143 136 138  K 4.2 3.9 3.1*  CL 102 101 103  CO2 26 25 27   GLUCOSE 101* 127* 128*  BUN 13 20 18   CREATININE 0.99 1.41* 1.24*  CALCIUM 10.0 9.1 9.0  MG 2.1  --   --    Liver Function Tests: Recent Labs  Lab 08/27/23 0056  AST 24  ALT 11  ALKPHOS 71  BILITOT 0.9  PROT 6.2*  ALBUMIN 3.2*   C Discharge time spent: greater than 30 minutes.  Signed: Leita Blanch, MD [facility] 08/28/2023

## 2023-08-28 NOTE — Care Management CC44 (Signed)
 Condition Code 44 Documentation Completed  Patient Details  Name: Breelle Hollywood MRN: [id number] Date of Birth: 01-Apr-1935   Condition Code 44 given:  Yes Patient signature on Condition Code 44 notice:  Yes Documentation of 2 MD's agreement:  Yes Code 44 added to claim:  Yes    Demonica Farrey M Malvina Schadler, RN 08/28/2023, 11:17 AM

## 2023-08-28 NOTE — Plan of Care (Signed)

## 2023-08-28 NOTE — Care Management Obs Status (Signed)
 MEDICARE OBSERVATION STATUS NOTIFICATION   Patient Details  Name: Terrianne Cavness MRN: [id number] Date of Birth: September 11, 1935   Medicare Observation Status Notification Given:  Yes    [place] CHRISTELLA Jaksch, RN 08/28/2023, 11:16 AM

## 2023-08-29 ENCOUNTER — Ambulatory Visit: Payer: Self-pay | Admitting: General Practice

## 2023-08-29 LAB — URINE CULTURE: Culture: 70000 — AB

## 2023-09-01 LAB — CULTURE, BLOOD (SINGLE)
Culture: NO GROWTH
Culture: NO GROWTH

## 2023-09-06 ENCOUNTER — Encounter: Payer: Self-pay | Admitting: General Practice

## 2023-09-06 ENCOUNTER — Ambulatory Visit (INDEPENDENT_AMBULATORY_CARE_PROVIDER_SITE_OTHER): Admitting: General Practice

## 2023-09-06 VITALS — BP 130/78 | HR 76 | Temp 97.8°F | Ht 65.0 in | Wt 285.0 lb

## 2023-09-06 DIAGNOSIS — I10 Essential (primary) hypertension: Secondary | ICD-10-CM | POA: Diagnosis not present

## 2023-09-06 DIAGNOSIS — N3945 Continuous leakage: Secondary | ICD-10-CM

## 2023-09-06 DIAGNOSIS — R110 Nausea: Secondary | ICD-10-CM | POA: Diagnosis not present

## 2023-09-06 DIAGNOSIS — E876 Hypokalemia: Secondary | ICD-10-CM

## 2023-09-06 DIAGNOSIS — Z09 Encounter for follow-up examination after completed treatment for conditions other than malignant neoplasm: Secondary | ICD-10-CM | POA: Insufficient documentation

## 2023-09-06 DIAGNOSIS — N390 Urinary tract infection, site not specified: Secondary | ICD-10-CM

## 2023-09-06 DIAGNOSIS — R8279 Other abnormal findings on microbiological examination of urine: Secondary | ICD-10-CM

## 2023-09-06 LAB — BASIC METABOLIC PANEL WITH GFR
BUN: 8 mg/dL (ref 6–23)
CO2: 28 meq/L (ref 19–32)
Calcium: 9.6 mg/dL (ref 8.4–10.5)
Chloride: 97 meq/L (ref 96–112)
Creatinine, Ser: 1.05 mg/dL (ref 0.40–1.20)
GFR: 48.2 mL/min — ABNORMAL LOW (ref >60.00)
Glucose, Bld: 95 mg/dL (ref 70–99)
Potassium: 3.6 meq/L (ref 3.5–5.1)
Sodium: 133 meq/L — ABNORMAL LOW (ref 135–145)

## 2023-09-06 LAB — CBC
HCT: 42.3 % (ref 36.0–46.0)
Hemoglobin: 14 g/dL (ref 12.0–15.0)
MCHC: 33 g/dL (ref 30.0–36.0)
MCV: 85.5 fl (ref 78.0–100.0)
Platelets: 252 K/uL (ref 150.0–400.0)
RBC: 4.95 Mil/uL (ref 3.87–5.11)
RDW: 14.5 % (ref 11.5–15.5)
WBC: 6.8 K/uL (ref 4.0–10.5)

## 2023-09-06 MED ORDER — ONDANSETRON 4 MG PO TBDP: 4.0000 mg | ORAL_TABLET | Freq: Three times a day (TID) | ORAL | 0 refills | Status: DC | PRN

## 2023-09-06 NOTE — Patient Instructions (Addendum)
 Stop by the lab prior to leaving today. I will notify you of your results once received.   Start Probiotic once daily. This is over the counter.  Start Zofran  (prescription) every 8 hours as needed for nausea. This was sent to [facility].   Ensure that you are drinking plenty of water; atleast 64 oz daily.  Ensure that you are eating enough fiber.   Someone from ADAPT team will call you. Please let us  know if you don't hear anything.  It was a pleasure to see you today!

## 2023-09-06 NOTE — Progress Notes (Signed)
 Established Patient Office Visit  Subjective   Patient ID: Lindsey Jacobs, female    DOB: 13-Mar-1935  Age: 88 y.o. MRN: [id number]  Chief Complaint  Patient presents with   Hospitalization Follow-up    Sepsis follow up    HPI Lindsey Jacobs is a 88 year old female with past medical history of HTN, aortic atherosclerosis, OSA, GERD, OA, HLD, obesity presents today for a hospital follow up.   Discussed the use of AI scribe software for clinical note transcription with the patient, who gave verbal consent to proceed.  History of Present Illness She was hospitalized on August 11th due to severe weakness, nausea, and an episode of syncope. Her daughter called 911, and she was taken to the hospital where she was treated for suspected pneumonia and sepsis with IV fluids and three different antibiotics. She was discharged on August 12th.  Post-discharge, she experiences ongoing gastrointestinal symptoms, including nausea and loose stools, which she attributes to the antibiotics received in the hospital. She denies diarrhea but notes slightly loose bowel movements. She maintains a diet that includes oatmeal every morning for fiber intake.   Her urine culture during the hospital stay was positive for a different bacteria than a previous UTI in July. She reports no urinary symptoms such as burning, urgency, or frequency. She has a history of urinary incontinence and has been wearing diapers for many years, which she believes contributes to her recurrent UTIs.  In the review of symptoms, she denies fever, chills, chest pain, shortness of breath, vomiting, and abdominal pain. She reports a slight headache and occasional weakness, which has improved since her hospitalization.   Patient Active Problem List   Diagnosis Date Noted   Hospital discharge follow-up 09/06/2023   Hypokalemia 08/28/2023   Sepsis due to pneumonia (HCC) 08/27/2023   AKI (acute kidney injury) (HCC) 08/27/2023   GERD without  esophagitis 08/27/2023   Lactic acid acidosis 08/27/2023   Primary osteoarthritis of ankle 08/02/2023   Aortic atherosclerosis (HCC) 03/12/2023   Pain of cervical spine 02/15/2023   Localized, primary osteoarthritis of shoulder region 02/15/2023   Functional urinary incontinence 07/28/2022   Cervical radiculitis 02/15/2021   CKD (chronic kidney disease) stage 3, GFR 30-59 ml/min (HCC) 09/14/2020   HLD (hyperlipidemia) 09/14/2020   Class 3 severe obesity due to excess calories with body mass index (BMI) of 45.0 to 49.9 in adult 09/14/2020   Vertigo 08/18/2019   Prediabetes 05/14/2019   AMD (age-related macular degeneration), bilateral 04/26/2018   Gastroesophageal reflux disease without esophagitis 02/12/2018   Essential hypertension 01/02/2018   Osteoarthritis 01/02/2018   Obstructive sleep apnea syndrome 01/02/2018   Past Medical History:  Diagnosis Date   Allergy    Colon polyps    Depression    GERD (gastroesophageal reflux disease)    Glaucoma    History of chicken pox    Hyperlipidemia    Hypertension    Sleep apnea    Urinary incontinence    Past Surgical History:  Procedure Laterality Date   ABDOMINAL HYSTERECTOMY  1984   BREAST EXCISIONAL BIOPSY Right    benign   CARPAL TUNNEL RELEASE Right 1990   CATARACT EXTRACTION, BILATERAL Bilateral 2004   CESAREAN SECTION  1974   CHOLECYSTECTOMY  1974   COLONOSCOPY WITH PROPOFOL  N/A 05/11/2022   Procedure: COLONOSCOPY WITH PROPOFOL ;  Surgeon: Jinny Carmine, MD;  Location: [facility] ENDOSCOPY;  Service: Endoscopy;  Laterality: N/A;   ESOPHAGOGASTRODUODENOSCOPY (EGD) WITH PROPOFOL  N/A 05/20/2019   Procedure: ESOPHAGOGASTRODUODENOSCOPY (EGD) WITH  PROPOFOL ;  Surgeon: Janalyn Keene NOVAK, MD;  Location: [facility] ENDOSCOPY;  Service: Endoscopy;  Laterality: N/A;   EYE SURGERY     JOINT REPLACEMENT     REPLACEMENT TOTAL KNEE Left 2007   REPLACEMENT TOTAL KNEE BILATERAL Right 2006   VARICOSE VEIN SURGERY Left 1965   Allergies  Allergen  Reactions   Amoxapine And Related     Hives   Amoxicillin Hives   Diamox [Acetazolamide]    Keflex  [Cephalexin ] Rash   Tape Rash         09/06/2023   11:23 AM 08/23/2023    8:31 AM 07/24/2023    9:58 AM  Depression screen PHQ 2/9  Decreased Interest 0 0 0  Down, Depressed, Hopeless 0 0 0  PHQ - 2 Score 0 0 0  Altered sleeping 0 2 1  Tired, decreased energy 0 0 0  Change in appetite 0 0 0  Feeling bad or failure about yourself  0 0 0  Trouble concentrating 0 1 0  Moving slowly or fidgety/restless 0 0 0  Suicidal thoughts 0 0 0  PHQ-9 Score 0 3 1  Difficult doing work/chores Not difficult at all Not difficult at all Not difficult at all       09/06/2023   11:24 AM 08/23/2023    8:32 AM 05/24/2023   10:34 AM 12/26/2022    8:33 AM  GAD 7 : Generalized Anxiety Score  Nervous, Anxious, on Edge 0 0 0 0  Control/stop worrying 0 0 0 0  Worry too much - different things 0 0 0 0  Trouble relaxing 0  0 0  Restless 0 0 0 0  Easily annoyed or irritable 0 0 0 0  Afraid - awful might happen 0 0 0 0  Total GAD 7 Score 0  0 0  Anxiety Difficulty Not difficult at all Not difficult at all Not difficult at all       Review of Systems  Constitutional:  Negative for chills and fever.  Respiratory:  Negative for shortness of breath.   Cardiovascular:  Negative for chest pain.  Gastrointestinal:  Positive for nausea. Negative for abdominal pain, constipation, diarrhea, heartburn and vomiting.  Genitourinary:  Negative for dysuria, frequency and urgency.  Neurological:  Positive for headaches. Negative for dizziness and weakness.  Endo/Heme/Allergies:  Negative for polydipsia.  Psychiatric/Behavioral:  Negative for depression and suicidal ideas. The patient is not nervous/anxious.       Objective:     BP 130/78   Pulse 76   Temp 97.8 F (36.6 C) (Oral)   Ht 5' 5 (1.651 m)   Wt 285 lb (129.3 kg)   LMP  (LMP Unknown)   SpO2 97%   BMI 47.43 kg/m  BP Readings from Last 3 Encounters:   09/06/23 130/78  08/28/23 (!) 150/63  08/23/23 122/74   Wt Readings from Last 3 Encounters:  09/06/23 285 lb (129.3 kg)  08/27/23 287 lb 14.7 oz (130.6 kg)  08/23/23 288 lb (130.6 kg)      Physical Exam Vitals and nursing note reviewed.  Constitutional:      Appearance: Normal appearance.  Cardiovascular:     Rate and Rhythm: Normal rate and regular rhythm.     Pulses: Normal pulses.     Heart sounds: Normal heart sounds.  Pulmonary:     Effort: Pulmonary effort is normal.     Breath sounds: Normal breath sounds.  Abdominal:     General: Bowel sounds are increased. There  is no distension.     Palpations: There is no mass.     Tenderness: There is no abdominal tenderness. There is no guarding.  Neurological:     Mental Status: She is alert and oriented to person, place, and time.  Psychiatric:        Mood and Affect: Mood normal.        Behavior: Behavior normal.        Thought Content: Thought content normal.        Judgment: Judgment normal.      No results found for any visits on 09/06/23.     The ASCVD Risk score (Arnett DK, et al., 2019) failed to calculate for the following reasons:   The 2019 ASCVD risk score is only valid for ages 59 to 46    Assessment & Plan:  Hospital discharge follow-up Assessment & Plan: Reviewed hospital notes, labs and imaging.   Hypokalemia -     CBC -     Basic metabolic panel with GFR  Essential hypertension -     CBC -     Basic metabolic panel with GFR  Nausea -     Ondansetron ; Take 1 tablet (4 mg total) by mouth every 8 (eight) hours as needed.  Dispense: 20 tablet; Refill: 0  Continuous leakage of urine -     For home use only DME Other see comment  Urine culture positive -     Urine Culture  Recurrent UTI    Assessment and Plan Assessment & Plan Urinary tract infection, recurrent with continuous urinary incontinence Recurrent UTI with continuous urinary incontinence. Recent urine culture showed  different bacteria. Asymptomatic but incontinence increases UTI risk. - Obtain urine culture today. - If positive, initiate appropriate antibiotics. - Order Purewick system for incontinence management and coordinate with [facility] team.  Loose stools and nausea secondary to recent antibiotics Loose stools and nausea likely due to recent IV antibiotics. Symptoms improving, no evidence of C. difficile infection. - Initiate daily probiotic therapy. - Prescribe Zofran  sublingually as needed for nausea. - Ensure adequate hydration and fiber intake.  Stage 3a chronic kidney disease Stage 3a CKD with stable kidney function. Recent creatinine levels fluctuated but returned to baseline. - Monitor kidney function with blood work today  Essential hypertension Essential hypertension with current medication. Recent BP 130/82 mmHg indicates adequate control.  Hypokalemia, resolved Hypokalemia resolved post-hospitalization. Potassium levels corrected and expected stable. - Check potassium levels with blood work today.   Return if symptoms worsen or fail to improve.    Carrol Aurora, NP

## 2023-09-06 NOTE — Assessment & Plan Note (Signed)
 Reviewed hospital notes, labs and imaging

## 2023-09-07 ENCOUNTER — Ambulatory Visit: Payer: Self-pay | Admitting: General Practice

## 2023-09-07 LAB — URINE CULTURE
MICRO NUMBER:: 16864741
SPECIMEN QUALITY:: ADEQUATE

## 2023-09-10 ENCOUNTER — Ambulatory Visit: Payer: Medicare Other | Admitting: Nurse Practitioner

## 2023-10-17 ENCOUNTER — Other Ambulatory Visit: Payer: Self-pay | Admitting: General Practice

## 2023-10-17 DIAGNOSIS — Z1231 Encounter for screening mammogram for malignant neoplasm of breast: Secondary | ICD-10-CM

## 2023-10-31 ENCOUNTER — Encounter: Payer: Self-pay | Admitting: General Practice

## 2023-11-20 ENCOUNTER — Ambulatory Visit
Admission: RE | Admit: 2023-11-20 | Discharge: 2023-11-20 | Disposition: A | Discharge status: Home / Self Care | Source: Ambulatory Visit | Attending: General Practice | Admitting: General Practice

## 2023-11-20 DIAGNOSIS — Z1231 Encounter for screening mammogram for malignant neoplasm of breast: Secondary | ICD-10-CM | POA: Insufficient documentation

## 2023-11-22 ENCOUNTER — Ambulatory Visit: Payer: Self-pay | Admitting: General Practice

## 2023-11-28 ENCOUNTER — Ambulatory Visit: Payer: Self-pay | Admitting: General Practice

## 2023-11-28 ENCOUNTER — Ambulatory Visit (INDEPENDENT_AMBULATORY_CARE_PROVIDER_SITE_OTHER): Admitting: General Practice

## 2023-11-28 ENCOUNTER — Encounter: Payer: Self-pay | Admitting: General Practice

## 2023-11-28 VITALS — BP 122/82 | HR 74 | Temp 97.2°F | Ht 65.0 in | Wt 284.0 lb

## 2023-11-28 DIAGNOSIS — Z6841 Body Mass Index (BMI) 40.0 and over, adult: Secondary | ICD-10-CM

## 2023-11-28 DIAGNOSIS — E66813 Obesity, class 3: Secondary | ICD-10-CM | POA: Diagnosis not present

## 2023-11-28 DIAGNOSIS — N1831 Chronic kidney disease, stage 3a: Secondary | ICD-10-CM

## 2023-11-28 DIAGNOSIS — I10 Essential (primary) hypertension: Secondary | ICD-10-CM

## 2023-11-28 DIAGNOSIS — M25512 Pain in left shoulder: Secondary | ICD-10-CM

## 2023-11-28 DIAGNOSIS — M25511 Pain in right shoulder: Secondary | ICD-10-CM

## 2023-11-28 DIAGNOSIS — M150 Primary generalized (osteo)arthritis: Secondary | ICD-10-CM | POA: Diagnosis not present

## 2023-11-28 DIAGNOSIS — H9193 Unspecified hearing loss, bilateral: Secondary | ICD-10-CM

## 2023-11-28 DIAGNOSIS — G8929 Other chronic pain: Secondary | ICD-10-CM

## 2023-11-28 DIAGNOSIS — R3981 Functional urinary incontinence: Secondary | ICD-10-CM

## 2023-11-28 LAB — BASIC METABOLIC PANEL WITH GFR
BUN: 14 mg/dL (ref 6–23)
CO2: 30 meq/L (ref 19–32)
Calcium: 9.7 mg/dL (ref 8.4–10.5)
Chloride: 105 meq/L (ref 96–112)
Creatinine, Ser: 0.99 mg/dL (ref 0.40–1.20)
GFR: 51.65 mL/min — ABNORMAL LOW (ref >60.00)
Glucose, Bld: 93 mg/dL (ref 70–99)
Potassium: 3.7 meq/L (ref 3.5–5.1)
Sodium: 142 meq/L (ref 135–145)

## 2023-11-28 NOTE — Progress Notes (Signed)
 Established Patient Office Visit  Subjective   Patient ID: Lindsey Jacobs, female    DOB: 1935/05/09  Age: 88 y.o. MRN: [id number]  Chief Complaint  Patient presents with   chronic care management    Patient here today to follow up on chronic conditions. Patient also needs referral to audiology for hearing test for hearing aids.     HPI  Lindsey Jacobs is a 88 year old female with past medical history of HTN, aortic atherosclerosis, OSA, GERD, cervical radiculitis, OA, CKD, prediabetes, age-related macular degeneration presents today for chronic care management.  Her daughter is also present today.   Discussed the use of AI scribe software for clinical note transcription with the patient, who gave verbal consent to proceed.  History of Present Illness She experiences hearing difficulties, characterized by the ability to hear sounds but not understand speech clearly, especially when watching television or when her daughter calls from another room. No ear pain, discharge, or tinnitus.  She has bilateral shoulder pain, sometimes radiating across her neck and scapula. The pain has been persistent, and she is awaiting her next scheduled steroid injections, which have previously provided relief for her shoulder and back pain. She has received similar injections in her ankles and shoulders in the past with beneficial effects.  She has a history of urinary incontinence, ongoing for years. She uses incontinence pads and diapers but experiences leakage, especially at night. Her daughter assists with managing the incontinence by using protective bedding. A previous urologist consultation indicated bladder dysfunction with premature emptying.  She is currently taking Maxzide  for blood pressure management. She urinates more frequently at night.   She also takes duloxetine , started years ago for depression. Reports no concerns.   She is concerned about her creatinine levels and monitors dietary advice  from online sources.    Patient Active Problem List   Diagnosis Date Noted   Bilateral hearing loss 11/28/2023   Hospital discharge follow-up 09/06/2023   Hypokalemia 08/28/2023   Sepsis due to pneumonia (HCC) 08/27/2023   AKI (acute kidney injury) 08/27/2023   GERD without esophagitis 08/27/2023   Lactic acid acidosis 08/27/2023   Primary osteoarthritis of ankle 08/02/2023   Aortic atherosclerosis 03/12/2023   Pain of cervical spine 02/15/2023   Localized, primary osteoarthritis of shoulder region 02/15/2023   Functional urinary incontinence 07/28/2022   Cervical radiculitis 02/15/2021   CKD (chronic kidney disease) stage 3, GFR 30-59 ml/min (HCC) 09/14/2020   HLD (hyperlipidemia) 09/14/2020   Class 3 severe obesity due to excess calories with body mass index (BMI) of 45.0 to 49.9 in adult (HCC) 09/14/2020   Chronic pain of both shoulders 08/18/2019   Vertigo 08/18/2019   Prediabetes 05/14/2019   AMD (age-related macular degeneration), bilateral 04/26/2018   Gastroesophageal reflux disease without esophagitis 02/12/2018   Essential hypertension 01/02/2018   Osteoarthritis 01/02/2018   Obstructive sleep apnea syndrome 01/02/2018   Past Medical History:  Diagnosis Date   Allergy    Colon polyps    Depression    GERD (gastroesophageal reflux disease)    Glaucoma    History of chicken pox    Hyperlipidemia    Hypertension    Sleep apnea    Urinary incontinence    Past Surgical History:  Procedure Laterality Date   ABDOMINAL HYSTERECTOMY  1984   BREAST EXCISIONAL BIOPSY Right    benign   CARPAL TUNNEL RELEASE Right 1990   CATARACT EXTRACTION, BILATERAL Bilateral 2004   CESAREAN SECTION  1974   CHOLECYSTECTOMY  1974   COLONOSCOPY WITH PROPOFOL  N/A 05/11/2022   Procedure: COLONOSCOPY WITH PROPOFOL ;  Surgeon: Jinny Carmine, MD;  Location: [facility] ENDOSCOPY;  Service: Endoscopy;  Laterality: N/A;   ESOPHAGOGASTRODUODENOSCOPY (EGD) WITH PROPOFOL  N/A 05/20/2019   Procedure:  ESOPHAGOGASTRODUODENOSCOPY (EGD) WITH PROPOFOL ;  Surgeon: Janalyn Keene NOVAK, MD;  Location: [facility] ENDOSCOPY;  Service: Endoscopy;  Laterality: N/A;   EYE SURGERY     JOINT REPLACEMENT     REPLACEMENT TOTAL KNEE Left 2007   REPLACEMENT TOTAL KNEE BILATERAL Right 2006   VARICOSE VEIN SURGERY Left 1965   Allergies  Allergen Reactions   Amoxapine And Related     Hives   Amoxicillin Hives   Diamox [Acetazolamide]    Keflex  [Cephalexin ] Rash   Tape Rash         11/28/2023    9:16 AM 09/06/2023   11:23 AM 08/23/2023    8:31 AM  Depression screen PHQ 2/9  Decreased Interest 0 0 0  Down, Depressed, Hopeless 0 0 0  PHQ - 2 Score 0 0 0  Altered sleeping 0 0 2  Tired, decreased energy 0 0 0  Change in appetite 0 0 0  Feeling bad or failure about yourself  0 0 0  Trouble concentrating 0 0 1  Moving slowly or fidgety/restless 0 0 0  Suicidal thoughts 0 0 0  PHQ-9 Score 0 0  3   Difficult doing work/chores Not difficult at all Not difficult at all Not difficult at all     Data saved with a previous flowsheet row definition       11/28/2023    9:16 AM 09/06/2023   11:24 AM 08/23/2023    8:32 AM 05/24/2023   10:34 AM  GAD 7 : Generalized Anxiety Score  Nervous, Anxious, on Edge 0 0 0 0  Control/stop worrying 0 0 0 0  Worry too much - different things 0 0 0 0  Trouble relaxing 0 0  0  Restless 0 0 0 0  Easily annoyed or irritable 0 0 0 0  Afraid - awful might happen 0 0 0 0  Total GAD 7 Score 0 0  0  Anxiety Difficulty Not difficult at all Not difficult at all Not difficult at all Not difficult at all      Review of Systems  Constitutional:  Negative for chills and fever.  HENT:  Positive for hearing loss. Negative for ear discharge, ear pain and tinnitus.   Respiratory:  Negative for shortness of breath.   Cardiovascular:  Negative for chest pain.  Gastrointestinal:  Negative for abdominal pain, constipation, diarrhea, heartburn, nausea and vomiting.  Genitourinary:  Negative  for dysuria, frequency and urgency.  Musculoskeletal:  Positive for joint pain.  Neurological:  Negative for dizziness and headaches.  Endo/Heme/Allergies:  Negative for polydipsia.  Psychiatric/Behavioral:  Negative for depression and suicidal ideas. The patient is not nervous/anxious.       Objective:     BP 122/82   Pulse 74   Temp (!) 97.2 F (36.2 C) (Oral)   Ht 5' 5 (1.651 m)   Wt 284 lb (128.8 kg)   LMP  (LMP Unknown)   SpO2 96%   BMI 47.26 kg/m  BP Readings from Last 3 Encounters:  11/28/23 122/82  09/06/23 130/78  08/28/23 (!) 150/63   Wt Readings from Last 3 Encounters:  11/28/23 284 lb (128.8 kg)  09/06/23 285 lb (129.3 kg)  08/27/23 287 lb 14.7 oz (130.6 kg)  Physical Exam Vitals and nursing note reviewed.  Constitutional:      Appearance: Normal appearance.  Cardiovascular:     Rate and Rhythm: Normal rate and regular rhythm.     Pulses: Normal pulses.     Heart sounds: Normal heart sounds.  Pulmonary:     Effort: Pulmonary effort is normal.     Breath sounds: Normal breath sounds.  Musculoskeletal:     Right shoulder: Decreased range of motion.     Left shoulder: Decreased range of motion.  Neurological:     Mental Status: She is alert and oriented to person, place, and time.  Psychiatric:        Mood and Affect: Mood normal.        Behavior: Behavior normal.        Thought Content: Thought content normal.        Judgment: Judgment normal.      Results for orders placed or performed in visit on 11/28/23  Basic metabolic panel with GFR  Result Value Ref Range   Sodium 142 135 - 145 mEq/L   Potassium 3.7 3.5 - 5.1 mEq/L   Chloride 105 96 - 112 mEq/L   CO2 30 19 - 32 mEq/L   Glucose, Bld 93 70 - 99 mg/dL   BUN 14 6 - 23 mg/dL   Creatinine, Ser 9.00 0.40 - 1.20 mg/dL   GFR 48.34 (L) >39.99 mL/min   Calcium 9.7 8.4 - 10.5 mg/dL       The ASCVD Risk score (Arnett DK, et al., 2019) failed to calculate for the following reasons:    The 2019 ASCVD risk score is only valid for ages 76 to 79    Assessment & Plan:  Essential hypertension -     Basic metabolic panel with GFR  Primary osteoarthritis involving multiple joints  Stage 3a chronic kidney disease (HCC)  Class 3 severe obesity due to excess calories with serious comorbidity and body mass index (BMI) of 45.0 to 49.9 in adult [facility]) Assessment & Plan: Discussed the importance of healthy diet and exercise to affect sustainable weight loss.     Bilateral hearing loss, unspecified hearing loss type -     Ambulatory referral to Audiology  Chronic pain of both shoulders  Functional urinary incontinence    Assessment and Plan Assessment & Plan Bilateral hearing loss Muffled hearing without pain or discharge. Possible wax buildup considered but not observed. Differential includes need for hearing aid versus simple wax removal. - Referred to audiologist for hearing evaluation.  Generalized osteoarthritis with bilateral shoulder and wrist pain Chronic bilateral shoulder and wrist pain managed with steroid injections, effective for shoulder and ankle pain. - Continue with scheduled steroid injections for pain management.  Essential hypertension Hypertension managed with Maxzide . Monitoring kidney function due to diuretic component. - BMP pending.  - Continue current antihypertensive regimen.  Chronic kidney disease stage 3a CKD stage 3a with previous hypokalemia. Monitoring kidney function due to diuretic use in hypertension management. - BMP pending.   Functional urinary incontinence Chronic urinary incontinence with nocturnal leakage. Previous urologist evaluation indicated anatomical bladder issues. Discussed scheduled toileting and Purewick bedside commode. - Implement scheduled toileting every 1-2 hours during the day.  Depression Managed with duloxetine , which also aids in pain management. No current depressive symptoms reported. - Continue  duloxetine  for mood and pain management.   Return in about 3 months (around 02/28/2024) for chronic care management.SABRA Carrol Aurora, NP

## 2023-11-28 NOTE — Patient Instructions (Addendum)
 You will either be contacted via phone regarding your referral to audiology , or you may receive a letter on your MyChart portal from our referral team with instructions for scheduling an appointment. Please let us  know if you have not been contacted by anyone within two weeks.   Call me and let me know about your medication.   I will see you in three months.   It was a pleasure to see you today!

## 2023-11-28 NOTE — Assessment & Plan Note (Signed)
 Discussed the importance of healthy diet and exercise to affect sustainable weight loss.

## 2023-12-31 ENCOUNTER — Encounter: Payer: Self-pay | Admitting: General Practice

## 2023-12-31 NOTE — Telephone Encounter (Signed)
 I spoke with pt; pt said on 12/30/23 she saw a lot of bright red blood in commode. Pt not sure if blood from bowel or urine. Pt said se strained just a little bit when had BM. No abd pain, no fever, no N&V. No burning pain or frequency of urine. Pt said has urinated and had BM today and has not seen any blood. Pt said she feels fine today but concerned about amt of blood seen on 12/30/23. Pt scheduled appt with KATHEE Aurora NP on 01/01/24 at 2:20 with UC & ED precautions and pt voiced understanding. Sending note to KATHEE Aurora NP.

## 2024-01-01 ENCOUNTER — Ambulatory Visit: Payer: Self-pay | Admitting: General Practice

## 2024-01-01 ENCOUNTER — Encounter: Payer: Self-pay | Admitting: General Practice

## 2024-01-01 ENCOUNTER — Ambulatory Visit: Admitting: General Practice

## 2024-01-01 VITALS — BP 122/82 | HR 75 | Temp 97.7°F | Ht 65.0 in | Wt 282.0 lb

## 2024-01-01 DIAGNOSIS — R8290 Unspecified abnormal findings in urine: Secondary | ICD-10-CM | POA: Diagnosis not present

## 2024-01-01 DIAGNOSIS — K921 Melena: Secondary | ICD-10-CM | POA: Insufficient documentation

## 2024-01-01 DIAGNOSIS — R10A2 Flank pain, left side: Secondary | ICD-10-CM

## 2024-01-01 LAB — POCT URINE DIPSTICK
Bilirubin, UA: NEGATIVE
Blood, UA: NEGATIVE
Glucose, UA: NEGATIVE mg/dL
Ketones, POC UA: NEGATIVE mg/dL
Nitrite, UA: NEGATIVE
POC PROTEIN,UA: 30 — AB
Spec Grav, UA: 1.01 (ref 1.010–1.025)
Urobilinogen, UA: 0.2 U/dL
pH, UA: 7 (ref 5.0–8.0)

## 2024-01-01 MED ORDER — SULFAMETHOXAZOLE-TRIMETHOPRIM 800-160 MG PO TABS: 1.0000 | ORAL_TABLET | Freq: Two times a day (BID) | ORAL | 0 refills | Status: DC

## 2024-01-01 NOTE — Progress Notes (Signed)
 Established Patient Office Visit  Subjective   Patient ID: Lindsey Jacobs, female    DOB: 17-Jun-1935  Age: 88 y.o. MRN: [id number]  Chief Complaint  Patient presents with   blood in toilet    On Sunday; patient states there was a lot. Patient states there was no blood when she wiped; just in the toilet. Patient has not had any more blood in the toilet but did notice a little tiny bit of blood today when wiping and states her stool was very soft today.    Lindsey Jacobs is a 88 year old female with past medical history of HTN, aortic atherosclerosis, OSA, GERD, OA presents today for an acute visit to discuss blood in stool.   HPI  Discussed the use of AI scribe software for clinical note transcription with the patient, who gave verbal consent to proceed.  History of Present Illness Lindsey Jacobs is an 88 year old female who presents with rectal bleeding.  She experienced significant rectal bleeding on Sunday, describing it as 'a whole toilet full of blood.' The following day, there was no blood in her stool, but she noted a small amount of blood when wiping on the day of the visit. Her stools are soft, and she occasionally uses prune juice or a laxative if she feels her colon has slowed down. She has a history of a thrombosed hemorrhoid, previously treated with antibiotics and resolved without surgical intervention.  A previous episode involved evaluation of a rectal mass, for which she underwent a CT scan and colonoscopy; she recalls being told that cancer was not found and that she has internal hemorrhoids. Her stools have been dark due to blueberry consumption, but the blood was bright red. She has a history of rectocele and is cautious about avoiding straining during bowel movements. She drinks water regularly, consuming at least one 51-ounce bottle daily.  She reports left flank pain when lying on her left side at night, which has been present for a few months. She has a history of a severe  UTI that led to sepsis, and she is vigilant about monitoring her urine for odor or other changes. No urinary symptoms, dizziness, fever, chills, nausea, vomiting, or diarrhea. No pain during bowel movements.    Patient Active Problem List   Diagnosis Date Noted   Blood in the stool 01/01/2024   Bilateral hearing loss 11/28/2023   Hospital discharge follow-up 09/06/2023   Hypokalemia 08/28/2023   Sepsis due to pneumonia (HCC) 08/27/2023   AKI (acute kidney injury) 08/27/2023   GERD without esophagitis 08/27/2023   Lactic acid acidosis 08/27/2023   Primary osteoarthritis of ankle 08/02/2023   Aortic atherosclerosis 03/12/2023   Pain of cervical spine 02/15/2023   Localized, primary osteoarthritis of shoulder region 02/15/2023   Functional urinary incontinence 07/28/2022   Cervical radiculitis 02/15/2021   CKD (chronic kidney disease) stage 3, GFR 30-59 ml/min (HCC) 09/14/2020   HLD (hyperlipidemia) 09/14/2020   Class 3 severe obesity due to excess calories with body mass index (BMI) of 45.0 to 49.9 in adult (HCC) 09/14/2020   Chronic pain of both shoulders 08/18/2019   Vertigo 08/18/2019   Prediabetes 05/14/2019   AMD (age-related macular degeneration), bilateral 04/26/2018   Gastroesophageal reflux disease without esophagitis 02/12/2018   Essential hypertension 01/02/2018   Osteoarthritis 01/02/2018   Obstructive sleep apnea syndrome 01/02/2018   Past Medical History:  Diagnosis Date   Allergy    Colon polyps    Depression    GERD (gastroesophageal  reflux disease)    Glaucoma    History of chicken pox    Hyperlipidemia    Hypertension    Sleep apnea    Urinary incontinence    Past Surgical History:  Procedure Laterality Date   ABDOMINAL HYSTERECTOMY  1984   BREAST EXCISIONAL BIOPSY Right    benign   CARPAL TUNNEL RELEASE Right 1990   CATARACT EXTRACTION, BILATERAL Bilateral 2004   CESAREAN SECTION  1974   CHOLECYSTECTOMY  1974   COLONOSCOPY WITH PROPOFOL  N/A  05/11/2022   Procedure: COLONOSCOPY WITH PROPOFOL ;  Surgeon: Jinny Carmine, MD;  Location: [facility];  Service: Endoscopy;  Laterality: N/A;   ESOPHAGOGASTRODUODENOSCOPY (EGD) WITH PROPOFOL  N/A 05/20/2019   Procedure: ESOPHAGOGASTRODUODENOSCOPY (EGD) WITH PROPOFOL ;  Surgeon: Janalyn Keene NOVAK, MD;  Location: [facility];  Service: Endoscopy;  Laterality: N/A;   EYE SURGERY     JOINT REPLACEMENT     REPLACEMENT TOTAL KNEE Left 2007   REPLACEMENT TOTAL KNEE BILATERAL Right 2006   VARICOSE VEIN SURGERY Left 1965   Allergies[1]       01/01/2024    2:40 PM 11/28/2023    9:16 AM 09/06/2023   11:23 AM  Depression screen PHQ 2/9  Decreased Interest 0 0 0  Down, Depressed, Hopeless 0 0 0  PHQ - 2 Score 0 0 0  Altered sleeping 1 0 0  Tired, decreased energy 0 0 0  Change in appetite 0 0 0  Feeling bad or failure about yourself  0 0 0  Trouble concentrating 0 0 0  Moving slowly or fidgety/restless 0 0 0  Suicidal thoughts 0 0 0  PHQ-9 Score 1 0 0   Difficult doing work/chores Not difficult at all Not difficult at all Not difficult at all     Data saved with a previous flowsheet row definition       01/01/2024    2:41 PM 11/28/2023    9:16 AM 09/06/2023   11:24 AM 08/23/2023    8:32 AM  GAD 7 : Generalized Anxiety Score  Nervous, Anxious, on Edge 0 0 0 0  Control/stop worrying 0 0 0 0  Worry too much - different things 0 0 0 0  Trouble relaxing 0 0 0   Restless 0 0 0 0  Easily annoyed or irritable 0 0 0 0  Afraid - awful might happen 0 0 0 0  Total GAD 7 Score 0 0 0   Anxiety Difficulty Not difficult at all Not difficult at all Not difficult at all Not difficult at all      Review of Systems  Constitutional:  Negative for chills and fever.  Respiratory:  Negative for shortness of breath.   Cardiovascular:  Negative for chest pain.  Gastrointestinal:  Positive for blood in stool. Negative for abdominal pain, constipation, diarrhea, heartburn, nausea and vomiting.   Genitourinary:  Positive for flank pain. Negative for dysuria, frequency and urgency.       Urine odor  Neurological:  Negative for dizziness and headaches.  Endo/Heme/Allergies:  Negative for polydipsia.  Psychiatric/Behavioral:  Negative for depression and suicidal ideas. The patient is not nervous/anxious.       Objective:     BP 122/82   Pulse 75   Temp 97.7 F (36.5 C) (Temporal)   Ht 5' 5 (1.651 m)   Wt 282 lb (127.9 kg)   LMP  (LMP Unknown)   SpO2 96%   BMI 46.93 kg/m  BP Readings from Last 3 Encounters:  01/01/24  122/82  11/28/23 122/82  09/06/23 130/78   Wt Readings from Last 3 Encounters:  01/01/24 282 lb (127.9 kg)  11/28/23 284 lb (128.8 kg)  09/06/23 285 lb (129.3 kg)      Physical Exam Vitals and nursing note reviewed.  Constitutional:      Appearance: Normal appearance.  Cardiovascular:     Rate and Rhythm: Normal rate and regular rhythm.     Pulses: Normal pulses.     Heart sounds: Normal heart sounds.  Pulmonary:     Effort: Pulmonary effort is normal.     Breath sounds: Normal breath sounds.  Abdominal:     Tenderness: There is no right CVA tenderness or left CVA tenderness.  Neurological:     Mental Status: She is alert and oriented to person, place, and time.  Psychiatric:        Mood and Affect: Mood normal.        Behavior: Behavior normal.        Thought Content: Thought content normal.        Judgment: Judgment normal.      Results for orders placed or performed in visit on 01/01/24  POCT URINE DIPSTICK  Result Value Ref Range   Color, UA light yellow (A) yellow   Clarity, UA cloudy (A) clear   Glucose, UA negative negative mg/dL   Bilirubin, UA negative negative   Ketones, POC UA negative negative mg/dL   Spec Grav, UA 8.989 8.989 - 1.025   Blood, UA negative negative   pH, UA 7.0 5.0 - 8.0   POC PROTEIN,UA =30 (A) negative, trace   Urobilinogen, UA 0.2 0.2 or 1.0 E.U./dL   Nitrite, UA Negative Negative   Leukocytes,  UA Large (3+) (A) Negative       The ASCVD Risk score (Arnett DK, et al., 2019) failed to calculate for the following reasons:   The 2019 ASCVD risk score is only valid for ages 64 to 21   * - Cholesterol units were assumed    Assessment & Plan:  Blood in the stool  Abnormal urine odor -     POCT URINE DIPSTICK -     Sulfamethoxazole -Trimethoprim ; Take 1 tablet by mouth 2 (two) times daily. For urinary tract infection.  Dispense: 6 tablet; Refill: 0 -     Urine Culture  Left flank pain -     POCT URINE DIPSTICK    Assessment and Plan Assessment & Plan Rectal bleeding due to internal hemorrhoids Intermittent rectal bleeding likely from internal hemorrhoids, confirmed by colonoscopy. Recent significant bleeding resolved spontaneously. Differential includes anal fissure or other rectal pathology. Bright red blood suggests lower GI source. - Monitor for recurrence of significant rectal bleeding. - If significant bleeding recurs, contact provider for gastroenterology referral. - Avoid straining during bowel movements; use Miralax or Colace if needed. - Ensure adequate hydration.  Left flank pain under evaluation Chronic left flank pain, exacerbated by certain positions. No acute changes or associated symptoms. - Continue to monitor symptoms and report any changes.  Abnormal urine odor under evaluation Mild abnormal urine odor noted. No associated urinary symptoms. Previous severe UTI with sepsis noted. - POC UA shows 3+ leuks, no nitrites.  - given history and symptoms; will treat.  -Start Bactrim  DS (sulfamethoxazole /trimethoprim ) tablets for urinary tract infection. Take 1 tablet by mouth twice daily for 3 days.  - Urine culture pending.  Rectocele Presence of rectocele noted. No current symptoms of constipation or straining. - Avoid straining during  bowel movements; use Miralax or Colace if needed.    Return if symptoms worsen or fail to improve.    Carrol Aurora, NP     [1]  Allergies Allergen Reactions   Amoxapine And Related     Hives   Amoxicillin Hives   Diamox [Acetazolamide]    Keflex  [Cephalexin ] Rash   Tape Rash

## 2024-01-01 NOTE — Patient Instructions (Addendum)
 Start Bactrim  DS (sulfamethoxazole /trimethoprim ) tablets for urinary tract infection. Take 1 tablet by mouth twice daily for 3 days.   Please start Colace 100 mg 2 capsules in the morning and 2 at bedtime.  Miralax once daily.  Ensure that you are drinking 64 ounces of water daily.  Increase fiber intake.     Update me if bleeding occurs again.   Follow up as scheduled.   It was a pleasure to see you today!

## 2024-01-02 LAB — URINE CULTURE
MICRO NUMBER:: 17362142
SPECIMEN QUALITY:: ADEQUATE

## 2024-01-24 DIAGNOSIS — R310 Gross hematuria: Secondary | ICD-10-CM | POA: Insufficient documentation

## 2024-01-24 NOTE — Assessment & Plan Note (Addendum)
 Gross hematuria  - CT A/P (Feb 2025) - Left extrarenal pelvis, proximal ureteral thickening vs artifact (incomplete delays/opacification) - otherwise normal   - Multiple UAs w/ negative blood   - Plan for dedicated CTU + office cystoscopy

## 2024-01-24 NOTE — Progress Notes (Unsigned)
 "  01/24/2024 7:45 AM   Margretta Pyles Dec 29, 1935 [id number]   HPI: 89 y.o. female here for initial evaluation of gross hematuria  History of prior UTI with sepsis   PMH: Past Medical History:  Diagnosis Date   Allergy    Colon polyps    Depression    GERD (gastroesophageal reflux disease)    Glaucoma    History of chicken pox    Hyperlipidemia    Hypertension    Sleep apnea    Urinary incontinence     Surgical History: Past Surgical History:  Procedure Laterality Date   ABDOMINAL HYSTERECTOMY  1984   BREAST EXCISIONAL BIOPSY Right    benign   CARPAL TUNNEL RELEASE Right 1990   CATARACT EXTRACTION, BILATERAL Bilateral 2004   CESAREAN SECTION  1974   CHOLECYSTECTOMY  1974   COLONOSCOPY WITH PROPOFOL  N/A 05/11/2022   Procedure: COLONOSCOPY WITH PROPOFOL ;  Surgeon: Jinny Carmine, MD;  Location: [facility];  Service: Endoscopy;  Laterality: N/A;   ESOPHAGOGASTRODUODENOSCOPY (EGD) WITH PROPOFOL  N/A 05/20/2019   Procedure: ESOPHAGOGASTRODUODENOSCOPY (EGD) WITH PROPOFOL ;  Surgeon: Janalyn Keene NOVAK, MD;  Location: [facility];  Service: Endoscopy;  Laterality: N/A;   EYE SURGERY     JOINT REPLACEMENT     REPLACEMENT TOTAL KNEE Left 2007   REPLACEMENT TOTAL KNEE BILATERAL Right 2006   VARICOSE VEIN SURGERY Left 1965    Family History: Family History  Problem Relation Age of Onset   Heart disease Father    Colon polyps Father    Alcohol abuse Father    Breast cancer Neg Hx     Social History:  reports that she has never smoked. She has never been exposed to tobacco smoke. She has never used smokeless tobacco. She reports that she does not drink alcohol and does not use drugs.      Physical Exam: LMP  (LMP Unknown)    Constitutional:  Alert and oriented, No acute distress. Cardiovascular: No clubbing, cyanosis, or edema. Respiratory: Normal respiratory effort, no increased work of breathing. GI: Nondistended Skin: No rashes, bruises or suspicious  lesions. Neurologic: Grossly intact, no focal deficits, moving all 4 extremities. Psychiatric: Normal mood and affect.  Laboratory Data:  Latest Reference Range & Units 08/23/23 09:17 08/27/23 00:56 08/27/23 05:44 09/06/23 12:21 11/28/23 09:46  Creatinine 0.40 - 1.20 mg/dL 9.00 8.58 (H) 8.75 (H) 1.05 0.99  (H): Data is abnormally high  UA 01/01/24) - negative for blood UA 08/27/23) - negative for blood UA 08/11/23 - negative for blood   Pertinent Imaging: I have personally viewed and interpreted the CT A/P w/ con w/ delays (Feb 2025) -morphologically normal-appearing bilateral kidneys, no hydronephrosis.  Excretory delays only capture renal pelvis and partial proximal ureters bilaterally.  Seems to have a mild left extrarenal pelvis, unclear if partial filling defect or abnormal left ureteral contour at [facility]-may be artifact secondary to incomplete opacification and distal imaging.  Left extrarenal pelvis is similar to comparison scan April 2024-less obvious ureteral thickening at UPJ.   IMPRESSION: 1. No acute abdominal/pelvic findings, mass lesions or adenopathy. 2. Stable diffuse colonic diverticulosis without findings for acute diverticulitis. 3. Stable lower abdominal hernia containing fat. 4. Stable cardiac enlargement and small pericardial effusion. 5. Aortic atherosclerosis..    Assessment & Plan:    Gross hematuria Assessment & Plan: Gross hematuria  - CT A/P (Feb 2025) - Left extrarenal pelvis, proximal ureteral thickening vs artifact (incomplete delays/opacification) - otherwise normal   - Multiple UAs w/ negative blood   -  Plan for dedicated CTU + office cystoscopy       Penne Skye, MD 01/24/2024  [facility] [address] [place], [place] [address] [phone] "

## 2024-01-30 ENCOUNTER — Encounter: Payer: Self-pay | Admitting: General Practice

## 2024-01-30 ENCOUNTER — Ambulatory Visit: Admitting: Urology

## 2024-01-30 DIAGNOSIS — R310 Gross hematuria: Secondary | ICD-10-CM

## 2024-01-31 ENCOUNTER — Ambulatory Visit
Admission: RE | Admit: 2024-01-31 | Discharge: 2024-01-31 | Disposition: A | Source: Ambulatory Visit | Attending: Nurse Practitioner

## 2024-01-31 ENCOUNTER — Ambulatory Visit (INDEPENDENT_AMBULATORY_CARE_PROVIDER_SITE_OTHER): Admitting: Nurse Practitioner

## 2024-01-31 ENCOUNTER — Encounter: Payer: Self-pay | Admitting: Nurse Practitioner

## 2024-01-31 VITALS — BP 126/74 | HR 80 | Temp 98.0°F

## 2024-01-31 DIAGNOSIS — R600 Localized edema: Secondary | ICD-10-CM | POA: Diagnosis not present

## 2024-01-31 DIAGNOSIS — M25571 Pain in right ankle and joints of right foot: Secondary | ICD-10-CM

## 2024-01-31 MED ORDER — PREDNISONE 20 MG PO TABS: 40.0000 mg | ORAL_TABLET | Freq: Every day | ORAL | 0 refills | Status: AC

## 2024-01-31 NOTE — Telephone Encounter (Signed)
 Noted and appreciate Matt's evaluation.   -Carrol Aurora, DNP, AGNP-C 01/31/2024 9:13 AM

## 2024-01-31 NOTE — Telephone Encounter (Signed)
 I spoke with pts daughter and pt' pt having rt ankle pain and swelling with pain level of 6. Pain is worse when walking with walker. No redness and no known njury. No CP or SOB. Pt scheduled appt with Adina Crandall NP 01/31/24 at 3:20 with UC and ED precautions and pt voiced understanding.. Sending note to KATHEE Aurora NP and CHRISTELLA Crandall  NP and [place] pool.

## 2024-01-31 NOTE — Progress Notes (Signed)
 "  Established Patient Office Visit  Subjective   Patient ID: Lindsey Jacobs, female    DOB: 29-Mar-1935  Age: 89 y.o. MRN: [id number]  Chief Complaint  Patient presents with   Acute Visit    Right ankle swelling and states, I just don't feel good.    HPI   With a history of htn, OSA, oa of ankle, prediabetes,   Discussed the use of AI scribe software for clinical note transcription with the patient, who gave verbal consent to proceed.  History of Present Illness Lindsey Jacobs is an 89 year old female with arthritis who presents with acute right ankle swelling and pain.  She has experienced significant swelling in her right ankle for the past two days, with pain described as 'terrible' and rated 10 out of 10 yesterday. The swelling is persistent, not worsening throughout the day, and remains swollen overnight. The left ankle is also swollen but does not hurt.  She has a history of arthritis in both ankles and receives injections for pain management, though not specifically for swelling. She denies any recent injury or trauma to the ankle and has never experienced swelling of this magnitude before. The pain is stabbing when standing and putting weight on the right ankle. She attempted to elevate her leg and perform exercises, but the pain was initially too severe. However, she notes slight improvement in mobility today.  No history of gout, blood clots, or recent long-distance travel. No shortness of breath and she uses two pillows for sleeping, which is her usual routine.  Her current medications include Maxzide  (triamterene  and hydrochlorothiazide ) for blood pressure and fluid management, which she has not refilled recently, Protonix  every other day for heartburn, and Zyrtec  as needed for allergies, which she has not used in the past year.        Review of Systems  Constitutional:  Negative for chills and fever.  Respiratory:  Negative for shortness of breath.   Cardiovascular:   Positive for leg swelling. Negative for chest pain.  Musculoskeletal:  Positive for joint pain.      Objective:     BP 126/74 (BP Location: Right Arm, Patient Position: Sitting, Cuff Size: Large)   Pulse 80   Temp 98 F (36.7 C) (Temporal)   LMP  (LMP Unknown)   SpO2 97%    Physical Exam Vitals and nursing note reviewed.  Constitutional:      Appearance: Normal appearance.  Cardiovascular:     Rate and Rhythm: Normal rate and regular rhythm.     Pulses:          Femoral pulses are 1+ on the right side and 1+ on the left side.      Dorsalis pedis pulses are 2+ on the right side and 2+ on the left side.     Heart sounds: Normal heart sounds.  Pulmonary:     Effort: Pulmonary effort is normal.     Breath sounds: Normal breath sounds.  Musculoskeletal:     Right lower leg: Edema (R>L) present.     Left lower leg: Edema present.     Comments: Warmth to the touch   47.5 cm left calf 47cm right calf  Neurological:     Mental Status: She is alert.      No results found for any visits on 01/31/24.    The ASCVD Risk score (Arnett DK, et al., 2019) failed to calculate for the following reasons:   The 2019 ASCVD risk score is only  valid for ages 54 to 38   * - Cholesterol units were assumed    Assessment & Plan:   Problem List Items Addressed This Visit   None Visit Diagnoses       Acute right ankle pain    -  Primary   Relevant Medications   predniSONE  (DELTASONE ) 20 MG tablet   Other Relevant Orders   CBC   Brain natriuretic peptide   Basic metabolic panel with GFR   Uric acid   DG Ankle Complete Right     Bilateral lower extremity edema       Relevant Medications   predniSONE  (DELTASONE ) 20 MG tablet   Other Relevant Orders   CBC   Brain natriuretic peptide   Basic metabolic panel with GFR      Assessment and Plan Assessment & Plan Acute gout flare and osteoarthritis of the right ankle Acute gout flare suspected due to swelling, pain, and warmth  in the right ankle. Risk factors include triamterene  use. - Ordered uric acid blood test. - Ordered right ankle x-ray. - Prescribed prednisone  for 5 days, to be taken in the morning with food.  Hypertension Managed with triamterene  and hydrochlorothiazide . No new symptoms. - Ensure continuation of triamterene  and hydrochlorothiazide  (Maxzide ).  Gastroesophageal reflux disease No recent exacerbations. - Continue Protonix  every other day.  Return if symptoms worsen or fail to improve.    Adina Crandall, NP  "

## 2024-01-31 NOTE — Telephone Encounter (Signed)
Evaluated in office today

## 2024-01-31 NOTE — Patient Instructions (Signed)
 Nice to see you today I will be I touch with the labs and xrays once I have them  Follow up if you do not improve   I think you have gout

## 2024-02-01 ENCOUNTER — Ambulatory Visit: Payer: Self-pay | Admitting: Nurse Practitioner

## 2024-02-01 DIAGNOSIS — E876 Hypokalemia: Secondary | ICD-10-CM

## 2024-02-01 LAB — BASIC METABOLIC PANEL WITH GFR
BUN: 12 mg/dL (ref 6–23)
CO2: 28 meq/L (ref 19–32)
Calcium: 9.2 mg/dL (ref 8.4–10.5)
Chloride: 100 meq/L (ref 96–112)
Creatinine, Ser: 1 mg/dL (ref 0.40–1.20)
GFR: 50.97 mL/min — ABNORMAL LOW
Glucose, Bld: 86 mg/dL (ref 70–99)
Potassium: 3.2 meq/L — ABNORMAL LOW (ref 3.5–5.1)
Sodium: 136 meq/L (ref 135–145)

## 2024-02-01 LAB — BRAIN NATRIURETIC PEPTIDE: Pro B Natriuretic peptide (BNP): 56 pg/mL (ref 1.0–100.0)

## 2024-02-01 LAB — CBC
HCT: 41.8 % (ref 36.0–46.0)
Hemoglobin: 14.1 g/dL (ref 12.0–15.0)
MCHC: 33.6 g/dL (ref 30.0–36.0)
MCV: 87 fl (ref 78.0–100.0)
Platelets: 282 K/uL (ref 150.0–400.0)
RBC: 4.81 Mil/uL (ref 3.87–5.11)
RDW: 14.6 % (ref 11.5–15.5)
WBC: 9.3 K/uL (ref 4.0–10.5)

## 2024-02-01 LAB — URIC ACID: Uric Acid, Serum: 5.9 mg/dL (ref 2.4–7.0)

## 2024-02-01 MED ORDER — POTASSIUM CHLORIDE CRYS ER 20 MEQ PO TBCR: 40.0000 meq | EXTENDED_RELEASE_TABLET | Freq: Once | ORAL | 0 refills | Status: AC

## 2024-02-11 NOTE — Progress Notes (Unsigned)
 "  02/11/24 6:56 AM   Lindsey Jacobs Feb 04, 1935 [id number]   HPI: 89 y.o. female here for initial evaluation of gross hematuria  History of prior UTI with sepsis   PMH: Past Medical History:  Diagnosis Date   Allergy    Colon polyps    Depression    GERD (gastroesophageal reflux disease)    Glaucoma    History of chicken pox    Hyperlipidemia    Hypertension    Sleep apnea    Urinary incontinence     Surgical History: Past Surgical History:  Procedure Laterality Date   ABDOMINAL HYSTERECTOMY  1984   BREAST EXCISIONAL BIOPSY Right    benign   CARPAL TUNNEL RELEASE Right 1990   CATARACT EXTRACTION, BILATERAL Bilateral 2004   CESAREAN SECTION  1974   CHOLECYSTECTOMY  1974   COLONOSCOPY WITH PROPOFOL  N/A 05/11/2022   Procedure: COLONOSCOPY WITH PROPOFOL ;  Surgeon: Jinny Carmine, MD;  Location: [facility];  Service: Endoscopy;  Laterality: N/A;   ESOPHAGOGASTRODUODENOSCOPY (EGD) WITH PROPOFOL  N/A 05/20/2019   Procedure: ESOPHAGOGASTRODUODENOSCOPY (EGD) WITH PROPOFOL ;  Surgeon: Janalyn Keene NOVAK, MD;  Location: [facility];  Service: Endoscopy;  Laterality: N/A;   EYE SURGERY     JOINT REPLACEMENT     REPLACEMENT TOTAL KNEE Left 2007   REPLACEMENT TOTAL KNEE BILATERAL Right 2006   VARICOSE VEIN SURGERY Left 1965    Family History: Family History  Problem Relation Age of Onset   Heart disease Father    Colon polyps Father    Alcohol abuse Father    Breast cancer Neg Hx     Social History:  reports that she has never smoked. She has never been exposed to tobacco smoke. She has never used smokeless tobacco. She reports that she does not drink alcohol and does not use drugs.      Physical Exam: LMP  (LMP Unknown)    Constitutional:  Alert and oriented, No acute distress. Cardiovascular: No clubbing, cyanosis, or edema. Respiratory: Normal respiratory effort, no increased work of breathing. GI: Nondistended Skin: No rashes, bruises or suspicious  lesions. Neurologic: Grossly intact, no focal deficits, moving all 4 extremities. Psychiatric: Normal mood and affect.  Laboratory Data:  Latest Reference Range & Units 08/23/23 09:17 08/27/23 00:56 08/27/23 05:44 09/06/23 12:21 11/28/23 09:46  Creatinine 0.40 - 1.20 mg/dL 9.00 8.58 (H) 8.75 (H) 1.05 0.99  (H): Data is abnormally high  UA 01/01/24) - negative for blood UA 08/27/23) - negative for blood UA 08/11/23 - negative for blood   Pertinent Imaging: I have personally viewed and interpreted the CT A/P w/ con w/ delays (Feb 2025) -morphologically normal-appearing bilateral kidneys, no hydronephrosis.  Excretory delays only capture renal pelvis and partial proximal ureters bilaterally.  Seems to have a mild left extrarenal pelvis, unclear if partial filling defect or abnormal left ureteral contour at [facility]-may be artifact secondary to incomplete opacification and distal imaging.  Left extrarenal pelvis is similar to comparison scan April 2024-less obvious ureteral thickening at UPJ.   IMPRESSION: 1. No acute abdominal/pelvic findings, mass lesions or adenopathy. 2. Stable diffuse colonic diverticulosis without findings for acute diverticulitis. 3. Stable lower abdominal hernia containing fat. 4. Stable cardiac enlargement and small pericardial effusion. 5. Aortic atherosclerosis..    Assessment & Plan:    Gross hematuria Assessment & Plan: Gross hematuria  - CT A/P (Feb 2025) - Left extrarenal pelvis, proximal ureteral thickening vs artifact (incomplete delays/opacification) - otherwise normal   - Multiple UAs w/ negative blood   -  Plan for dedicated CTU + office cystoscopy        Penne Skye, MD 02/11/2024  [facility] [address], [address] [place], [place] [address] [phone] "

## 2024-02-11 NOTE — Assessment & Plan Note (Signed)
 Gross hematuria  - CT A/P (Feb 2025) - Left extrarenal pelvis, proximal ureteral thickening vs artifact (incomplete delays/opacification) - otherwise normal   - Multiple UAs w/ negative blood   - Plan for dedicated CTU + office cystoscopy

## 2024-02-13 ENCOUNTER — Ambulatory Visit: Payer: Self-pay

## 2024-02-13 ENCOUNTER — Encounter: Payer: Self-pay | Admitting: General Practice

## 2024-02-13 ENCOUNTER — Ambulatory Visit: Admitting: Urology

## 2024-02-13 DIAGNOSIS — R310 Gross hematuria: Secondary | ICD-10-CM

## 2024-02-13 NOTE — Telephone Encounter (Signed)
 FYI Only or Action Required?: FYI only for provider: appointment scheduled on 02/14/24.  Patient was last seen in primary care on 01/31/2024 by Wendee Lynwood HERO, NP.  Called Nurse Triage reporting cloudy urine.  Symptoms began several days ago.  Interventions attempted: Nothing.  Symptoms are: stable.  Triage Disposition: See Physician Within 24 Hours (overriding See HCP Within 4 Hours (Or PCP Triage))  Patient/caregiver understands and will follow disposition?: Yes   Reason for Disposition  Side (flank) or lower back pain present  Answer Assessment - Initial Assessment Questions Pts daughter Asberry calling in today with patient.   1. SYMPTOM: What's the main symptom you're concerned about? (e.g., frequency, incontinence)     Cloudy urine, abnormal odor  2. ONSET: When did the urinary symptoms  start?      Few days ago  3. PAIN: Is there any pain? If Yes, ask: How bad is it? (Scale: 1-10; mild, moderate, severe)     Denies   4. CAUSE: What do you think is causing the symptoms?     UTI  5. OTHER SYMPTOMS: Do you have any other symptoms? (e.g., blood in urine, fever, flank pain, pain with urination)     Left flank pain, denies hematuria, dysuria or urinary frequency.  Protocols used: Urinary Symptoms-A-AH  Message from Princess Anne Z sent at 02/13/2024  1:00 PM EST  Reason for Triage: Patient's daughter called to report that patient has complaints of early signs of UT, please review and contact pt to advise [phone]

## 2024-02-14 ENCOUNTER — Encounter: Payer: Self-pay | Admitting: Family Medicine

## 2024-02-14 ENCOUNTER — Ambulatory Visit (INDEPENDENT_AMBULATORY_CARE_PROVIDER_SITE_OTHER): Admitting: Family Medicine

## 2024-02-14 VITALS — BP 170/96 | HR 95 | Temp 98.6°F | Ht 65.0 in | Wt 290.0 lb

## 2024-02-14 DIAGNOSIS — R10A2 Flank pain, left side: Secondary | ICD-10-CM

## 2024-02-14 DIAGNOSIS — R8290 Unspecified abnormal findings in urine: Secondary | ICD-10-CM | POA: Diagnosis not present

## 2024-02-14 LAB — POCT URINE DIPSTICK
Bilirubin, UA: NEGATIVE
Glucose, UA: NEGATIVE mg/dL
Ketones, POC UA: NEGATIVE mg/dL
Nitrite, UA: NEGATIVE
POC PROTEIN,UA: 30 — AB
Spec Grav, UA: 1.015
Urobilinogen, UA: 0.2 U/dL
pH, UA: 7

## 2024-02-14 MED ORDER — NITROFURANTOIN MONOHYD MACRO 100 MG PO CAPS: 100.0000 mg | ORAL_CAPSULE | Freq: Two times a day (BID) | ORAL | 0 refills | Status: AC

## 2024-02-14 NOTE — Assessment & Plan Note (Signed)
"   Recent urine culture from 1 month ago returned negative although urine odor resolved I feel flank pain and cloudy urine at this point may be due to bacterial colonization of the bladder. Urinalysis again today is positive.  Would normally like to hold off on urine culture results before treating but given upcoming snowstorm I will start her on nitrofurantoin  x 5 days. She does have upcoming appointment with urology next week for further recommendations.  No further blood seen in commode.  Return and ER precautions provided. "

## 2024-02-14 NOTE — Progress Notes (Signed)
 "   Patient ID: Lindsey Jacobs, female    DOB: Mar 11, 1935, 89 y.o.   MRN: [id number]  This visit was conducted in person.  BP (!) 170/96   Pulse 95   Temp 98.6 F (37 C) (Temporal)   Ht 5' 5 (1.651 m)   Wt 290 lb (131.5 kg)   LMP  (LMP Unknown)   SpO2 97%   BMI 48.26 kg/m    CC:  Chief Complaint  Patient presents with   Flank Pain    Left   Abnormal  Odor Urine    Subjective:   HPI: Lindsey Jacobs is a 89 y.o. female presenting on 02/14/2024 for Flank Pain (Left) and Abnormal  Odor Urine   Flank Pain This is a new problem. The current episode started in the past 7 days (now 4-5 days). The problem occurs intermittently. The problem has been gradually worsening since onset. The pain is present in the lumbar spine and thoracic spine. The pain does not radiate. The pain is at a severity of 4/10. The pain is moderate. The symptoms are aggravated by twisting. Associated symptoms include bladder incontinence. Pertinent negatives include no abdominal pain, bowel incontinence, chest pain, dysuria, fever, headaches, numbness, paresthesias, pelvic pain, perianal numbness, tingling or weakness. (Urine  cloudiness  No frequency  or urgency  Has chronic incontinence wears pads.) Risk factors include sedentary lifestyle (no recent falls, has histroy of spinal stenosis severe in low back). She has tried heat for the symptoms. The treatment provided significant relief.    Seen in 12/16 for urine odor has appt next week for urology.  Was treated with Sulfa  for 3 days.   Urine odor resolved    No blood in commode since.   S/P partial hysterectomy.   Using tylenol  off and on for pain.  Relevant past medical, surgical, family and social history reviewed and updated as indicated. Interim medical history since our last visit reviewed. Allergies and medications reviewed and updated. Outpatient Medications Prior to Visit  Medication Sig Dispense Refill   Carboxymeth-Glyc-Polysorb PF (REFRESH DIGITAL  PF) 0.5-1-0.5 % SOLN Apply to eye.     cetirizine  (ZYRTEC ) 10 MG tablet Take 10 mg by mouth daily as needed for allergies.     Cholecalciferol  (VITAMIN D3 MAXIMUM STRENGTH PO) Take by mouth.     cycloSPORINE (RESTASIS) 0.05 % ophthalmic emulsion Place 1 drop into both eyes at bedtime.     DULoxetine  (CYMBALTA ) 60 MG capsule Take 60 mg by mouth daily.     fluticasone  (FLONASE ) 50 MCG/ACT nasal spray Place 2 sprays into both nostrils daily as needed for allergies or rhinitis.     Latanoprostene Bunod (VYZULTA) 0.024 % SOLN INSTILL 1 DROP IN BOTH EYES DAILY IN THE EVENING     Magnesium  Oxide 500 MG TABS Take by mouth. Patient is taking 2 tablet once daily.     Multiple Vitamins-Minerals (ADVANCED EYE HEALTH PO) Take by mouth.     pantoprazole  (PROTONIX ) 40 MG tablet Take 1 tablet (40 mg total) by mouth daily. 90 tablet 3   potassium chloride  SA (KLOR-CON  M) 20 MEQ tablet Take 2 tablets (40 mEq total) by mouth once. 2 tablet 0   triamterene -hydrochlorothiazide  (MAXZIDE -25) 37.5-25 MG tablet Take 1 tablet by mouth daily. 30 tablet 0   cetirizine  (ZYRTEC ) 10 MG tablet Take 1 tablet (10 mg total) by mouth daily. (Patient taking differently: Take 10 mg by mouth as needed.) 90 tablet 3   fluticasone  (FLONASE ) 50 MCG/ACT nasal spray Place 2  sprays into both nostrils daily. (Patient taking differently: Place 2 sprays into both nostrils as needed.) 16 g 6   No facility-administered medications prior to visit.     Per HPI unless specifically indicated in ROS section below Review of Systems  Constitutional:  Negative for fatigue and fever.  HENT:  Negative for congestion.   Eyes:  Negative for pain.  Respiratory:  Negative for cough and shortness of breath.   Cardiovascular:  Negative for chest pain, palpitations and leg swelling.  Gastrointestinal:  Negative for abdominal pain and bowel incontinence.  Genitourinary:  Positive for bladder incontinence and flank pain. Negative for dysuria, pelvic pain and  vaginal bleeding.  Musculoskeletal:  Negative for back pain.  Neurological:  Negative for tingling, syncope, weakness, light-headedness, numbness, headaches and paresthesias.  Psychiatric/Behavioral:  Negative for dysphoric mood.    Objective:  BP (!) 170/96   Pulse 95   Temp 98.6 F (37 C) (Temporal)   Ht 5' 5 (1.651 m)   Wt 290 lb (131.5 kg)   LMP  (LMP Unknown)   SpO2 97%   BMI 48.26 kg/m   Wt Readings from Last 3 Encounters:  02/14/24 290 lb (131.5 kg)  01/01/24 282 lb (127.9 kg)  11/28/23 284 lb (128.8 kg)      Physical Exam Constitutional:      General: She is not in acute distress.    Appearance: Normal appearance. She is well-developed. She is not ill-appearing or toxic-appearing.     Comments: In wheelchair  HENT:     Head: Normocephalic.     Right Ear: Hearing, tympanic membrane, ear canal and external ear normal. Tympanic membrane is not erythematous, retracted or bulging.     Left Ear: Hearing, tympanic membrane, ear canal and external ear normal. Tympanic membrane is not erythematous, retracted or bulging.     Nose: No mucosal edema or rhinorrhea.     Right Sinus: No maxillary sinus tenderness or frontal sinus tenderness.     Left Sinus: No maxillary sinus tenderness or frontal sinus tenderness.     Mouth/Throat:     Pharynx: Uvula midline.  Eyes:     General: Lids are normal. Lids are everted, no foreign bodies appreciated.     Conjunctiva/sclera: Conjunctivae normal.     Pupils: Pupils are equal, round, and reactive to light.  Neck:     Thyroid : No thyroid  mass or thyromegaly.     Vascular: No carotid bruit.     Trachea: Trachea normal.  Cardiovascular:     Rate and Rhythm: Normal rate and regular rhythm.     Pulses: Normal pulses.     Heart sounds: Normal heart sounds, S1 normal and S2 normal. No murmur heard.    No friction rub. No gallop.  Pulmonary:     Effort: Pulmonary effort is normal. No tachypnea or respiratory distress.     Breath sounds:  Normal breath sounds. No decreased breath sounds, wheezing, rhonchi or rales.  Abdominal:     General: Bowel sounds are normal.     Palpations: Abdomen is soft.     Tenderness: There is no abdominal tenderness. There is no right CVA tenderness or left CVA tenderness.  Musculoskeletal:     Cervical back: Normal, normal range of motion and neck supple.     Thoracic back: Normal.     Lumbar back: Tenderness present. No bony tenderness. Decreased range of motion. Negative right straight leg raise test and negative left straight leg raise test.  Skin:  General: Skin is warm and dry.     Findings: No rash.  Neurological:     Mental Status: She is alert.  Psychiatric:        Mood and Affect: Mood is not anxious or depressed.        Speech: Speech normal.        Behavior: Behavior normal. Behavior is cooperative.        Thought Content: Thought content normal.        Judgment: Judgment normal.       Results for orders placed or performed in visit on 02/14/24  POCT URINE DIPSTICK   Collection Time: 02/14/24 10:37 AM  Result Value Ref Range   Color, UA yellow yellow   Clarity, UA cloudy (A) clear   Glucose, UA negative negative mg/dL   Bilirubin, UA negative negative   Ketones, POC UA negative negative mg/dL   Spec Grav, UA 8.984 8.989 - 1.025   Blood, UA trace-intact (A) negative   pH, UA 7.0 5.0 - 8.0   POC PROTEIN,UA =30 (A) negative, trace   Urobilinogen, UA 0.2 0.2 or 1.0 E.U./dL   Nitrite, UA Negative Negative   Leukocytes, UA Large (3+) (A) Negative    Assessment and Plan  Left flank pain Assessment & Plan: Pain in back at waistline and low back likely due to history of spinal stenosis, not clearly over CVA. Can use Tylenol  as needed for pain.  Orders: -     POCT URINE DIPSTICK -     Urine Culture  Abnormal urine odor Assessment & Plan:  Recent urine culture from 1 month ago returned negative although urine odor resolved I feel flank pain and cloudy urine at this  point may be due to bacterial colonization of the bladder. Urinalysis again today is positive.  Would normally like to hold off on urine culture results before treating but given upcoming snowstorm I will start her on nitrofurantoin  x 5 days. She does have upcoming appointment with urology next week for further recommendations.  No further blood seen in commode.  Return and ER precautions provided.  Orders: -     POCT URINE DIPSTICK -     Urine Culture  Other orders -     Nitrofurantoin  Monohyd Macro; Take 1 capsule (100 mg total) by mouth 2 (two) times daily.  Dispense: 10 capsule; Refill: 0    No follow-ups on file.   Greig Ring, MD  "

## 2024-02-14 NOTE — Assessment & Plan Note (Signed)
 Pain in back at waistline and low back likely due to history of spinal stenosis, not clearly over CVA. Can use Tylenol  as needed for pain.

## 2024-02-15 LAB — URINE CULTURE
MICRO NUMBER:: 17526874
Result:: NO GROWTH
SPECIMEN QUALITY:: ADEQUATE

## 2024-02-18 ENCOUNTER — Other Ambulatory Visit: Payer: Self-pay | Admitting: Cardiology

## 2024-02-19 ENCOUNTER — Ambulatory Visit: Payer: Self-pay | Admitting: Family Medicine

## 2024-02-20 NOTE — Telephone Encounter (Signed)
 Pt had labs done on 11/28/2023 medication sent to pt's pharmacy

## 2024-02-21 ENCOUNTER — Ambulatory Visit: Admitting: Urology

## 2024-02-21 VITALS — BP 150/86 | HR 80 | Ht 65.0 in | Wt 290.0 lb

## 2024-02-21 DIAGNOSIS — R310 Gross hematuria: Secondary | ICD-10-CM

## 2024-02-21 DIAGNOSIS — I10 Essential (primary) hypertension: Secondary | ICD-10-CM | POA: Diagnosis not present

## 2024-02-21 LAB — URINALYSIS, COMPLETE
Bilirubin, UA: NEGATIVE
Glucose, UA: NEGATIVE
Ketones, UA: NEGATIVE
Leukocytes,UA: NEGATIVE
Nitrite, UA: NEGATIVE
RBC, UA: NEGATIVE
Specific Gravity, UA: 1.015 (ref 1.005–1.030)
Urobilinogen, Ur: 0.2 mg/dL (ref 0.2–1.0)
pH, UA: 7.5 (ref 5.0–7.5)

## 2024-02-21 LAB — BLADDER SCAN AMB NON-IMAGING: Scan Result: 163

## 2024-02-21 LAB — MICROSCOPIC EXAMINATION: Epithelial Cells (non renal): >10 /HPF — AB (ref 0–10)

## 2024-02-21 NOTE — Patient Instructions (Signed)
 Scheduled ct [phone]

## 2024-02-21 NOTE — Progress Notes (Signed)
 Patient presents for an office visit. BP today is High. Greater than 140/90. Provider  notified and recheck Blood Pressure 150 /86.  Pt advised to talk with PCP.  Pt voiced understanding.

## 2024-02-21 NOTE — Assessment & Plan Note (Addendum)
 HTN > 140 today. Reviewed blood pressure across two separate readings in clinic.  Recommended that patient secure close follow up with their PCP for further management

## 2024-02-28 ENCOUNTER — Ambulatory Visit: Admitting: General Practice

## 2024-03-27 ENCOUNTER — Ambulatory Visit: Admitting: Urology

## 2024-07-24 ENCOUNTER — Ambulatory Visit

## 2024-07-25 ENCOUNTER — Ambulatory Visit
